# Patient Record
Sex: Female | Born: 1935
Health system: Southern US, Community
[De-identification: ages and names within clinical notes are randomized; demographics above are authoritative.]

## PROBLEM LIST (undated history)

## (undated) DIAGNOSIS — N186 End stage renal disease: Secondary | ICD-10-CM

## (undated) DIAGNOSIS — I10 Essential (primary) hypertension: Secondary | ICD-10-CM

## (undated) DIAGNOSIS — D649 Anemia, unspecified: Secondary | ICD-10-CM

## (undated) DIAGNOSIS — R600 Localized edema: Secondary | ICD-10-CM

## (undated) DIAGNOSIS — Z9289 Personal history of other medical treatment: Secondary | ICD-10-CM

## (undated) DIAGNOSIS — N059 Unspecified nephritic syndrome with unspecified morphologic changes: Secondary | ICD-10-CM

## (undated) DIAGNOSIS — R001 Bradycardia, unspecified: Secondary | ICD-10-CM

## (undated) DIAGNOSIS — I2510 Atherosclerotic heart disease of native coronary artery without angina pectoris: Secondary | ICD-10-CM

## (undated) DIAGNOSIS — E785 Hyperlipidemia, unspecified: Secondary | ICD-10-CM

## (undated) DIAGNOSIS — I6529 Occlusion and stenosis of unspecified carotid artery: Secondary | ICD-10-CM

## (undated) DIAGNOSIS — M810 Age-related osteoporosis without current pathological fracture: Secondary | ICD-10-CM

## (undated) DIAGNOSIS — E039 Hypothyroidism, unspecified: Secondary | ICD-10-CM

## (undated) DIAGNOSIS — N189 Chronic kidney disease, unspecified: Secondary | ICD-10-CM

## (undated) HISTORY — DX: Personal history of other medical treatment: Z92.89

## (undated) HISTORY — DX: Anemia, unspecified: D64.9

## (undated) HISTORY — DX: Age-related osteoporosis without current pathological fracture: M81.0

## (undated) HISTORY — DX: Chronic kidney disease, unspecified: N18.9

## (undated) HISTORY — DX: Hypothyroidism, unspecified: E03.9

## (undated) HISTORY — DX: Occlusion and stenosis of unspecified carotid artery: I65.29

## (undated) HISTORY — DX: Hyperlipidemia, unspecified: E78.5

## (undated) HISTORY — DX: Atherosclerotic heart disease of native coronary artery without angina pectoris: I25.10

## (undated) HISTORY — DX: Unspecified nephritic syndrome with unspecified morphologic changes: N05.9

## (undated) HISTORY — DX: Bradycardia, unspecified: R00.1

## (undated) HISTORY — DX: End stage renal disease: N18.6

## (undated) HISTORY — DX: Localized edema: R60.0

---
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
## 1999-05-08 HISTORY — PX: ANGIOPLASTY: SHX39

## 2003-01-06 DIAGNOSIS — Z9289 Personal history of other medical treatment: Secondary | ICD-10-CM

## 2003-01-06 HISTORY — DX: Personal history of other medical treatment: Z92.89

## 2003-05-08 HISTORY — PX: CARDIAC CATHETERIZATION: SHX172

## 2010-05-07 HISTORY — PX: EYE SURGERY: SHX253

## 2011-01-16 ENCOUNTER — Other Ambulatory Visit: Payer: Self-pay | Admitting: Family Medicine

## 2011-01-16 DIAGNOSIS — Z1231 Encounter for screening mammogram for malignant neoplasm of breast: Secondary | ICD-10-CM

## 2011-01-23 ENCOUNTER — Ambulatory Visit: Payer: Self-pay

## 2011-02-06 ENCOUNTER — Ambulatory Visit
Admission: RE | Admit: 2011-02-06 | Discharge: 2011-02-06 | Disposition: A | Discharge status: Home / Self Care | Payer: Medicare Other | Source: Ambulatory Visit | Attending: Family Medicine | Admitting: Family Medicine

## 2011-02-06 DIAGNOSIS — Z1231 Encounter for screening mammogram for malignant neoplasm of breast: Secondary | ICD-10-CM

## 2011-06-25 DIAGNOSIS — E039 Hypothyroidism, unspecified: Secondary | ICD-10-CM | POA: Diagnosis not present

## 2011-06-25 DIAGNOSIS — Z862 Personal history of diseases of the blood and blood-forming organs and certain disorders involving the immune mechanism: Secondary | ICD-10-CM | POA: Diagnosis not present

## 2011-06-25 DIAGNOSIS — M810 Age-related osteoporosis without current pathological fracture: Secondary | ICD-10-CM | POA: Diagnosis not present

## 2011-06-25 DIAGNOSIS — I2510 Atherosclerotic heart disease of native coronary artery without angina pectoris: Secondary | ICD-10-CM | POA: Diagnosis not present

## 2011-06-25 DIAGNOSIS — Z79899 Other long term (current) drug therapy: Secondary | ICD-10-CM | POA: Diagnosis not present

## 2011-06-25 DIAGNOSIS — E7800 Pure hypercholesterolemia, unspecified: Secondary | ICD-10-CM | POA: Diagnosis not present

## 2011-07-05 DIAGNOSIS — R030 Elevated blood-pressure reading, without diagnosis of hypertension: Secondary | ICD-10-CM | POA: Diagnosis not present

## 2011-07-05 DIAGNOSIS — E7800 Pure hypercholesterolemia, unspecified: Secondary | ICD-10-CM | POA: Diagnosis not present

## 2011-07-05 DIAGNOSIS — I2510 Atherosclerotic heart disease of native coronary artery without angina pectoris: Secondary | ICD-10-CM | POA: Diagnosis not present

## 2011-07-12 DIAGNOSIS — N039 Chronic nephritic syndrome with unspecified morphologic changes: Secondary | ICD-10-CM | POA: Diagnosis not present

## 2011-08-09 DIAGNOSIS — E7800 Pure hypercholesterolemia, unspecified: Secondary | ICD-10-CM | POA: Diagnosis not present

## 2011-08-09 DIAGNOSIS — E039 Hypothyroidism, unspecified: Secondary | ICD-10-CM | POA: Diagnosis not present

## 2011-08-09 DIAGNOSIS — Z79899 Other long term (current) drug therapy: Secondary | ICD-10-CM | POA: Diagnosis not present

## 2011-09-26 DIAGNOSIS — H04129 Dry eye syndrome of unspecified lacrimal gland: Secondary | ICD-10-CM | POA: Diagnosis not present

## 2011-10-11 DIAGNOSIS — E039 Hypothyroidism, unspecified: Secondary | ICD-10-CM | POA: Diagnosis not present

## 2011-10-11 DIAGNOSIS — E7800 Pure hypercholesterolemia, unspecified: Secondary | ICD-10-CM | POA: Diagnosis not present

## 2011-10-11 DIAGNOSIS — Z79899 Other long term (current) drug therapy: Secondary | ICD-10-CM | POA: Diagnosis not present

## 2011-12-11 DIAGNOSIS — D234 Other benign neoplasm of skin of scalp and neck: Secondary | ICD-10-CM | POA: Diagnosis not present

## 2011-12-11 DIAGNOSIS — Z85828 Personal history of other malignant neoplasm of skin: Secondary | ICD-10-CM | POA: Diagnosis not present

## 2011-12-11 DIAGNOSIS — D485 Neoplasm of uncertain behavior of skin: Secondary | ICD-10-CM | POA: Diagnosis not present

## 2011-12-26 DIAGNOSIS — E039 Hypothyroidism, unspecified: Secondary | ICD-10-CM | POA: Diagnosis not present

## 2012-01-09 DIAGNOSIS — I10 Essential (primary) hypertension: Secondary | ICD-10-CM | POA: Diagnosis not present

## 2012-01-09 DIAGNOSIS — E7800 Pure hypercholesterolemia, unspecified: Secondary | ICD-10-CM | POA: Diagnosis not present

## 2012-01-09 DIAGNOSIS — I2510 Atherosclerotic heart disease of native coronary artery without angina pectoris: Secondary | ICD-10-CM | POA: Diagnosis not present

## 2012-01-14 DIAGNOSIS — Z23 Encounter for immunization: Secondary | ICD-10-CM | POA: Diagnosis not present

## 2012-01-14 DIAGNOSIS — I2510 Atherosclerotic heart disease of native coronary artery without angina pectoris: Secondary | ICD-10-CM | POA: Diagnosis not present

## 2012-01-14 DIAGNOSIS — N039 Chronic nephritic syndrome with unspecified morphologic changes: Secondary | ICD-10-CM | POA: Diagnosis not present

## 2012-01-23 DIAGNOSIS — I2510 Atherosclerotic heart disease of native coronary artery without angina pectoris: Secondary | ICD-10-CM | POA: Diagnosis not present

## 2012-01-23 DIAGNOSIS — Z79899 Other long term (current) drug therapy: Secondary | ICD-10-CM | POA: Diagnosis not present

## 2012-01-23 DIAGNOSIS — E7800 Pure hypercholesterolemia, unspecified: Secondary | ICD-10-CM | POA: Diagnosis not present

## 2012-01-23 DIAGNOSIS — I10 Essential (primary) hypertension: Secondary | ICD-10-CM | POA: Diagnosis not present

## 2012-02-20 DIAGNOSIS — I2510 Atherosclerotic heart disease of native coronary artery without angina pectoris: Secondary | ICD-10-CM | POA: Diagnosis not present

## 2012-02-20 DIAGNOSIS — I10 Essential (primary) hypertension: Secondary | ICD-10-CM | POA: Diagnosis not present

## 2012-04-24 DIAGNOSIS — Z79899 Other long term (current) drug therapy: Secondary | ICD-10-CM | POA: Diagnosis not present

## 2012-04-24 DIAGNOSIS — E7800 Pure hypercholesterolemia, unspecified: Secondary | ICD-10-CM | POA: Diagnosis not present

## 2012-05-27 DIAGNOSIS — J069 Acute upper respiratory infection, unspecified: Secondary | ICD-10-CM | POA: Diagnosis not present

## 2012-07-09 DIAGNOSIS — I2510 Atherosclerotic heart disease of native coronary artery without angina pectoris: Secondary | ICD-10-CM | POA: Diagnosis not present

## 2012-07-14 DIAGNOSIS — I2510 Atherosclerotic heart disease of native coronary artery without angina pectoris: Secondary | ICD-10-CM | POA: Diagnosis not present

## 2012-07-14 DIAGNOSIS — R809 Proteinuria, unspecified: Secondary | ICD-10-CM | POA: Diagnosis not present

## 2012-07-14 DIAGNOSIS — N049 Nephrotic syndrome with unspecified morphologic changes: Secondary | ICD-10-CM | POA: Diagnosis not present

## 2012-10-23 DIAGNOSIS — E7800 Pure hypercholesterolemia, unspecified: Secondary | ICD-10-CM | POA: Diagnosis not present

## 2012-10-23 DIAGNOSIS — Z79899 Other long term (current) drug therapy: Secondary | ICD-10-CM | POA: Diagnosis not present

## 2012-12-10 DIAGNOSIS — L570 Actinic keratosis: Secondary | ICD-10-CM | POA: Diagnosis not present

## 2012-12-10 DIAGNOSIS — Z85828 Personal history of other malignant neoplasm of skin: Secondary | ICD-10-CM | POA: Diagnosis not present

## 2013-01-07 DIAGNOSIS — I10 Essential (primary) hypertension: Secondary | ICD-10-CM | POA: Diagnosis not present

## 2013-01-07 DIAGNOSIS — I2510 Atherosclerotic heart disease of native coronary artery without angina pectoris: Secondary | ICD-10-CM | POA: Diagnosis not present

## 2013-01-07 DIAGNOSIS — E7800 Pure hypercholesterolemia, unspecified: Secondary | ICD-10-CM | POA: Diagnosis not present

## 2013-01-09 DIAGNOSIS — E039 Hypothyroidism, unspecified: Secondary | ICD-10-CM | POA: Diagnosis not present

## 2013-01-09 DIAGNOSIS — Z23 Encounter for immunization: Secondary | ICD-10-CM | POA: Diagnosis not present

## 2013-01-09 DIAGNOSIS — I10 Essential (primary) hypertension: Secondary | ICD-10-CM | POA: Diagnosis not present

## 2013-01-09 DIAGNOSIS — E785 Hyperlipidemia, unspecified: Secondary | ICD-10-CM | POA: Diagnosis not present

## 2013-01-19 DIAGNOSIS — I2510 Atherosclerotic heart disease of native coronary artery without angina pectoris: Secondary | ICD-10-CM | POA: Diagnosis not present

## 2013-01-19 DIAGNOSIS — E785 Hyperlipidemia, unspecified: Secondary | ICD-10-CM | POA: Diagnosis not present

## 2013-01-19 DIAGNOSIS — R809 Proteinuria, unspecified: Secondary | ICD-10-CM | POA: Diagnosis not present

## 2013-01-19 DIAGNOSIS — N039 Chronic nephritic syndrome with unspecified morphologic changes: Secondary | ICD-10-CM | POA: Diagnosis not present

## 2013-01-19 DIAGNOSIS — N049 Nephrotic syndrome with unspecified morphologic changes: Secondary | ICD-10-CM | POA: Diagnosis not present

## 2013-01-24 ENCOUNTER — Other Ambulatory Visit: Payer: Self-pay | Admitting: Cardiology

## 2013-01-24 DIAGNOSIS — Z79899 Other long term (current) drug therapy: Secondary | ICD-10-CM

## 2013-01-24 DIAGNOSIS — E7800 Pure hypercholesterolemia, unspecified: Secondary | ICD-10-CM

## 2013-02-25 ENCOUNTER — Other Ambulatory Visit: Payer: Self-pay | Admitting: General Surgery

## 2013-02-25 MED ORDER — METOPROLOL SUCCINATE ER 50 MG PO TB24: 50.0000 mg | ORAL_TABLET | Freq: Every day | ORAL | Status: DC

## 2013-02-26 ENCOUNTER — Other Ambulatory Visit: Payer: Self-pay | Admitting: General Surgery

## 2013-02-26 ENCOUNTER — Telehealth: Payer: Self-pay | Admitting: Cardiology

## 2013-02-26 NOTE — Telephone Encounter (Signed)
New message    Refill----[facility]  Fax [phone].  Pt want 90day supply

## 2013-02-26 NOTE — Telephone Encounter (Signed)
Pt is aware med was sent in yesterday

## 2013-02-26 NOTE — Telephone Encounter (Signed)
Already sent in. No need for another

## 2013-04-27 ENCOUNTER — Other Ambulatory Visit (INDEPENDENT_AMBULATORY_CARE_PROVIDER_SITE_OTHER): Payer: Medicare Other

## 2013-04-27 DIAGNOSIS — E7800 Pure hypercholesterolemia, unspecified: Secondary | ICD-10-CM | POA: Diagnosis not present

## 2013-04-27 DIAGNOSIS — Z79899 Other long term (current) drug therapy: Secondary | ICD-10-CM

## 2013-04-27 LAB — LIPID PANEL
Cholesterol: 175 mg/dL (ref 0–200)
LDL Cholesterol: 89 mg/dL (ref 0–99)
Triglycerides: 91 mg/dL (ref 0.0–149.0)
VLDL: 18.2 mg/dL (ref 0.0–40.0)

## 2013-04-28 ENCOUNTER — Encounter: Payer: Self-pay | Admitting: General Surgery

## 2013-04-28 ENCOUNTER — Other Ambulatory Visit: Payer: Self-pay | Admitting: General Surgery

## 2013-04-28 DIAGNOSIS — E785 Hyperlipidemia, unspecified: Secondary | ICD-10-CM

## 2013-07-23 DIAGNOSIS — N049 Nephrotic syndrome with unspecified morphologic changes: Secondary | ICD-10-CM | POA: Diagnosis not present

## 2013-07-23 DIAGNOSIS — R809 Proteinuria, unspecified: Secondary | ICD-10-CM | POA: Diagnosis not present

## 2013-07-23 DIAGNOSIS — N039 Chronic nephritic syndrome with unspecified morphologic changes: Secondary | ICD-10-CM | POA: Diagnosis not present

## 2013-07-23 DIAGNOSIS — E785 Hyperlipidemia, unspecified: Secondary | ICD-10-CM | POA: Diagnosis not present

## 2013-07-23 DIAGNOSIS — I2510 Atherosclerotic heart disease of native coronary artery without angina pectoris: Secondary | ICD-10-CM | POA: Diagnosis not present

## 2013-07-27 ENCOUNTER — Ambulatory Visit: Payer: Medicare Other | Admitting: Cardiology

## 2013-07-30 ENCOUNTER — Encounter: Payer: Self-pay | Admitting: General Surgery

## 2013-07-30 DIAGNOSIS — E7800 Pure hypercholesterolemia, unspecified: Secondary | ICD-10-CM | POA: Insufficient documentation

## 2013-07-30 DIAGNOSIS — I2510 Atherosclerotic heart disease of native coronary artery without angina pectoris: Secondary | ICD-10-CM

## 2013-07-30 DIAGNOSIS — I10 Essential (primary) hypertension: Secondary | ICD-10-CM

## 2013-08-10 ENCOUNTER — Encounter: Payer: Self-pay | Admitting: Cardiology

## 2013-08-10 ENCOUNTER — Ambulatory Visit (INDEPENDENT_AMBULATORY_CARE_PROVIDER_SITE_OTHER): Payer: Medicare Other | Admitting: Cardiology

## 2013-08-10 VITALS — BP 177/62 | HR 61 | Ht 64.0 in | Wt 159.0 lb

## 2013-08-10 DIAGNOSIS — I10 Essential (primary) hypertension: Secondary | ICD-10-CM

## 2013-08-10 DIAGNOSIS — I2510 Atherosclerotic heart disease of native coronary artery without angina pectoris: Secondary | ICD-10-CM

## 2013-08-10 DIAGNOSIS — E7800 Pure hypercholesterolemia, unspecified: Secondary | ICD-10-CM

## 2013-08-10 NOTE — Patient Instructions (Signed)
Your physician recommends that you continue on your current medications as directed. Please refer to the Current Medication list given to you today.  Monitor your blood pressure once this week and call with reading. [phone]  Your physician wants you to follow-up in: 6 months You will receive a reminder letter in the mail two months in advance. If you don't receive a letter, please call our office to schedule the follow-up appointment.

## 2013-08-10 NOTE — Progress Notes (Signed)
[facility], [place], [place]  [address] Phone: [phone] Fax:  [phone]  Date:  0000000   ID:  Cree Ramchandani, DOB [id number], MRN [id number]  PCP:  Gavin Pound, MD  Cardiologist:  Fransico Him, MD     History of Present Illness: Wendy Walters is a 80 y.o. female with a history of  ASCAD s/p PCI of LAD 2001 in setting of AWMI and cath 2005 with patent stent to the LAD by cath 2005, dyslipidemia and HTN.  She is doing well.  She denies any chest pain, SOB, DOE,  dizziness, palpitations or syncope.  She has recently had some LE edema and her nephrologist just put her on Lasix a few days ago.  She walks for exercise.   Wt Readings from Last 3 Encounters:  08/10/13 159 lb (72.122 kg)     Past Medical History  Diagnosis Date  . Chronic kidney disease     stage 3  . Fibrillary glomerulonephritis     w nephrotic range protenuria, following w neurology, managed with an ACE-I and ARB  . Heart attack 02/03/2000  . Hypothyroidism   . Hyperlipidemia   . Osteoporosis     s/p actonel for about 7-8 years  . Coronary artery disease     s/p PCI 2001 of lad in setting of AWMI-rotational atherectomy with BMS -Dr Radford Pax  . H/O Doppler ultrasound 01/2003    ,39% bilateral carotid stenosis.    Current Outpatient Prescriptions  Medication Sig Dispense Refill  . amLODipine (NORVASC) 2.5 MG tablet Take 2.5 mg by mouth daily.      . Ascorbic Acid (VITAMIN C) 1000 MG tablet Take 1,000 mg by mouth daily.      Marland Kitchen aspirin 81 MG tablet Take 81 mg by mouth daily.      Marland Kitchen atorvastatin (LIPITOR) 80 MG tablet Take 80 mg by mouth daily.      . B Complex Vitamins (VITAMIN B COMPLEX PO) Take by mouth daily.      . calcium carbonate (CALCIUM 600) 600 MG TABS tablet Take 600 mg by mouth daily with breakfast.      . cholecalciferol (VITAMIN D) 1000 UNITS tablet Take by mouth daily.      . Coenzyme Q10 (COQ10) 200 MG CAPS Take by mouth daily.      . ferrous fumarate (HEMOCYTE - 106 MG FE)  325 (106 FE) MG TABS tablet Take 1 tablet by mouth daily.      . furosemide (LASIX) 20 MG tablet Take 20 mg by mouth daily.      Marland Kitchen levothyroxine (SYNTHROID, LEVOTHROID) 88 MCG tablet Take 88 mcg by mouth daily before breakfast.      . lisinopril (PRINIVIL,ZESTRIL) 40 MG tablet Take 40 mg by mouth 2 (two) times daily.       Marland Kitchen losartan (COZAAR) 50 MG tablet Take 50 mg by mouth daily.      . metoprolol succinate (TOPROL-XL) 50 MG 24 hr tablet Take 1 tablet (50 mg total) by mouth daily. Take with or immediately following a meal.  90 tablet  2  . Misc Natural Products (OSTEO BI-FLEX JOINT SHIELD PO) Take 2 tablets by mouth daily.      . Multiple Vitamin (MULTIVITAMIN) tablet Take 1 tablet by mouth daily.      . niacin 500 MG tablet Take 500 mg by mouth 2 (two) times daily with a meal.      . Omega-3 Fatty Acids (FISH OIL) 1000 MG CAPS  Take 5 capsules by mouth daily.       No current facility-administered medications for this visit.    Allergies:    Allergies  Allergen Reactions  . Codeine Nausea Only    Social History:  The patient  reports that she has quit smoking. She does not have any smokeless tobacco history on file. She reports that she does not drink alcohol or use illicit drugs.   Family History:  The patient's family history includes Cancer - Prostate in her father; Hypertension in her mother.   ROS:  Please see the history of present illness.      All other systems reviewed and negative.   PHYSICAL EXAM: VS:  BP 177/62  Pulse 61  Ht 5\' 4"  (1.626 m)  Wt 159 lb (72.122 kg)  BMI 27.28 kg/m2 Well nourished, well developed, in no acute distress HEENT: normal Neck: no JVD Cardiac:  normal S1, S2; RRR; no murmur Lungs:  clear to auscultation bilaterally, no wheezing, rhonchi or rales Abd: soft, nontender, no hepatomegaly Ext: trace edema Skin: warm and dry Neuro:  CNs 2-12 intact, no focal abnormalities noted       ASSESSMENT AND PLAN:  1. ASCAD with no angina - continue  ASA 2. HTN elevated today but she says it is normal at home - Check BP daily for a week and call with the results - continue Lisinopril/Losartan/Metoprolol/amlodipine 3. Dyslipidemia with LDL at goal 10/2012 -continue atorvastatin/fish oil  Followup with me in 6 months   Signed, Fransico Him, MD 08/10/2013 8:50 AM

## 2013-08-17 ENCOUNTER — Telehealth: Payer: Self-pay | Admitting: Cardiology

## 2013-08-17 NOTE — Telephone Encounter (Signed)
TO Dr Radford Pax to advise.

## 2013-08-17 NOTE — Telephone Encounter (Signed)
Pt is aware.  

## 2013-08-17 NOTE — Telephone Encounter (Signed)
BP well controlled no change in meds at this time

## 2013-08-17 NOTE — Telephone Encounter (Signed)
New message    Patient calling with blood pressures reading    4/6 131/82 4/7 138/83 4/8 115/80 4/9 109/69 4/10 111/74  4/11 116/71 4/12 141/84 4/13 130/85.

## 2013-09-07 DIAGNOSIS — N039 Chronic nephritic syndrome with unspecified morphologic changes: Secondary | ICD-10-CM | POA: Diagnosis not present

## 2013-09-07 DIAGNOSIS — R809 Proteinuria, unspecified: Secondary | ICD-10-CM | POA: Diagnosis not present

## 2013-09-07 DIAGNOSIS — E785 Hyperlipidemia, unspecified: Secondary | ICD-10-CM | POA: Diagnosis not present

## 2013-09-07 DIAGNOSIS — N049 Nephrotic syndrome with unspecified morphologic changes: Secondary | ICD-10-CM | POA: Diagnosis not present

## 2013-09-07 DIAGNOSIS — I2510 Atherosclerotic heart disease of native coronary artery without angina pectoris: Secondary | ICD-10-CM | POA: Diagnosis not present

## 2013-10-15 ENCOUNTER — Other Ambulatory Visit: Payer: Self-pay

## 2013-10-15 MED ORDER — ATORVASTATIN CALCIUM 80 MG PO TABS: 80.0000 mg | ORAL_TABLET | Freq: Every day | ORAL | Status: DC

## 2013-10-15 MED ORDER — AMLODIPINE BESYLATE 2.5 MG PO TABS: 2.5000 mg | ORAL_TABLET | Freq: Every day | ORAL | Status: DC

## 2013-10-27 ENCOUNTER — Encounter: Payer: Self-pay | Admitting: General Surgery

## 2013-10-27 ENCOUNTER — Other Ambulatory Visit: Payer: Self-pay | Admitting: General Surgery

## 2013-10-27 ENCOUNTER — Other Ambulatory Visit (INDEPENDENT_AMBULATORY_CARE_PROVIDER_SITE_OTHER): Payer: Medicare Other

## 2013-10-27 DIAGNOSIS — E785 Hyperlipidemia, unspecified: Secondary | ICD-10-CM | POA: Diagnosis not present

## 2013-10-27 DIAGNOSIS — E7800 Pure hypercholesterolemia, unspecified: Secondary | ICD-10-CM

## 2013-10-27 LAB — LIPID PANEL
Cholesterol: 165 mg/dL (ref 0–200)
HDL: 69.6 mg/dL (ref >39.00)
LDL Cholesterol: 74 mg/dL (ref 0–99)
NONHDL: 95.4
TRIGLYCERIDES: 107 mg/dL (ref 0.0–149.0)
Total CHOL/HDL Ratio: 2
VLDL: 21.4 mg/dL (ref 0.0–40.0)

## 2013-10-27 LAB — ALT: ALT: 27 U/L (ref 0–35)

## 2013-12-01 ENCOUNTER — Other Ambulatory Visit: Payer: Self-pay | Admitting: *Deleted

## 2013-12-01 MED ORDER — METOPROLOL SUCCINATE ER 50 MG PO TB24
50.0000 mg | ORAL_TABLET | Freq: Every day | ORAL | Status: DC
Start: 2013-12-01 — End: 2014-03-09

## 2014-01-12 ENCOUNTER — Encounter: Payer: Self-pay | Admitting: Cardiology

## 2014-01-12 DIAGNOSIS — E039 Hypothyroidism, unspecified: Secondary | ICD-10-CM | POA: Diagnosis not present

## 2014-01-12 DIAGNOSIS — Z0000 Encounter for general adult medical examination without abnormal findings: Secondary | ICD-10-CM | POA: Diagnosis not present

## 2014-01-12 DIAGNOSIS — E785 Hyperlipidemia, unspecified: Secondary | ICD-10-CM | POA: Diagnosis not present

## 2014-01-12 DIAGNOSIS — Z23 Encounter for immunization: Secondary | ICD-10-CM | POA: Diagnosis not present

## 2014-01-12 DIAGNOSIS — I10 Essential (primary) hypertension: Secondary | ICD-10-CM | POA: Diagnosis not present

## 2014-01-14 DIAGNOSIS — Z85828 Personal history of other malignant neoplasm of skin: Secondary | ICD-10-CM | POA: Diagnosis not present

## 2014-01-14 DIAGNOSIS — L570 Actinic keratosis: Secondary | ICD-10-CM | POA: Diagnosis not present

## 2014-01-15 ENCOUNTER — Encounter: Payer: Self-pay | Admitting: General Surgery

## 2014-01-18 ENCOUNTER — Encounter: Payer: Self-pay | Admitting: General Surgery

## 2014-01-26 DIAGNOSIS — M810 Age-related osteoporosis without current pathological fracture: Secondary | ICD-10-CM | POA: Diagnosis not present

## 2014-02-03 DIAGNOSIS — Z1211 Encounter for screening for malignant neoplasm of colon: Secondary | ICD-10-CM | POA: Diagnosis not present

## 2014-02-08 NOTE — Progress Notes (Signed)
[facility], [address] [place], [place]  [address] Phone: [phone] Fax:  [phone]  Date:  [id number]   ID:  Cabria Elefante, DOB [id number], MRN [id number]  PCP:  Gavin Pound, MD  Cardiologist:  Fransico Him, MD    History of Present Illness: Wendy Walters is a 80 y.o. female with a history of ASCAD s/p PCI of LAD 2001 in setting of AWMI and cath 2005 with patent stent to the LAD by cath 2005, dyslipidemia and HTN. She is doing well. She denies any chest pain, SOB, DOE, dizziness, palpitations or syncope.   She walks for exercise.  She has chronic LE edema that is stable and controlled on diuretics.    Wt Readings from Last 3 Encounters:  02/09/14 157 lb (71.215 kg)  08/10/13 159 lb (72.122 kg)     Past Medical History  Diagnosis Date  . Chronic kidney disease     stage 3  . Fibrillary glomerulonephritis     w nephrotic range protenuria, following w neurology, managed with an ACE-I and ARB  . Heart attack 02/03/2000  . Hypothyroidism   . Hyperlipidemia   . Osteoporosis     s/p actonel for about 7-8 years  . Coronary artery disease     s/p PCI 2001 of lad in setting of AWMI-rotational atherectomy with BMS -Dr Radford Pax  . H/O Doppler ultrasound 01/2003    ,39% bilateral carotid stenosis.    Current Outpatient Prescriptions  Medication Sig Dispense Refill  . amLODipine (NORVASC) 2.5 MG tablet Take 1 tablet (2.5 mg total) by mouth daily.  90 tablet  2  . Ascorbic Acid (VITAMIN C) 1000 MG tablet Take 1,000 mg by mouth daily.      Marland Kitchen aspirin 81 MG tablet Take 81 mg by mouth daily.      Marland Kitchen atorvastatin (LIPITOR) 80 MG tablet Take 1 tablet (80 mg total) by mouth daily.  90 tablet  2  . B Complex Vitamins (VITAMIN B COMPLEX PO) Take by mouth daily.      . calcium carbonate (CALCIUM 600) 600 MG TABS tablet Take 600 mg by mouth daily with breakfast.      . cholecalciferol (VITAMIN D) 1000 UNITS tablet Take by mouth daily.      . Coenzyme Q10 (COQ10) 200 MG CAPS Take by  mouth daily.      . ferrous fumarate (HEMOCYTE - 106 MG FE) 325 (106 FE) MG TABS tablet Take 1 tablet by mouth daily.      . furosemide (LASIX) 20 MG tablet Take 20 mg by mouth daily.      Marland Kitchen levothyroxine (SYNTHROID, LEVOTHROID) 88 MCG tablet Take 88 mcg by mouth daily before breakfast.      . lisinopril (PRINIVIL,ZESTRIL) 40 MG tablet Take 40 mg by mouth 2 (two) times daily.       Marland Kitchen losartan (COZAAR) 50 MG tablet Take 50 mg by mouth daily.      . metoprolol succinate (TOPROL-XL) 50 MG 24 hr tablet Take 1 tablet (50 mg total) by mouth daily. Take with or immediately following a meal.  90 tablet  0  . Misc Natural Products (OSTEO BI-FLEX JOINT SHIELD PO) Take 2 tablets by mouth daily.      . Multiple Vitamin (MULTIVITAMIN) tablet Take 1 tablet by mouth daily.      . niacin 500 MG tablet Take 500 mg by mouth 2 (two) times daily with a meal.      . Omega-3 Fatty Acids (FISH  OIL) 1000 MG CAPS Take 5 capsules by mouth daily.       No current facility-administered medications for this visit.    Allergies:    Allergies  Allergen Reactions  . Codeine Nausea Only    Social History:  The patient  reports that she has quit smoking. She does not have any smokeless tobacco history on file. She reports that she does not drink alcohol or use illicit drugs.   Family History:  The patient's family history includes Cancer - Prostate in her father; Hypertension in her mother.   ROS:  Please see the history of present illness.      All other systems reviewed and negative.   PHYSICAL EXAM: VS:  BP 134/70  Pulse 63  Ht 5\' 4"  (1.626 m)  Wt 157 lb (71.215 kg)  BMI 26.94 kg/m2 Well nourished, well developed, in no acute distress HEENT: normal Neck: no JVD Cardiac:  normal S1, S2; RRR; no murmur Lungs:  clear to auscultation bilaterally, no wheezing, rhonchi or rales Abd: soft, nontender, no hepatomegaly Ext: trace edema Skin: warm and dry Neuro:  CNs 2-12 intact, no focal abnormalities noted    ASSESSMENT AND PLAN:  1. ASCAD with no angina - continue ASA  2. HTN - well controlled - continue Lisinopril/Losartan/Metoprolol/amlodipine  3. Dyslipidemia with LDL near goal 10/2013 -continue atorvastatin/fish oil   Followup with me in 6 months      Signed, Fransico Him, MD [facility] 02/09/2014 10:33 AM

## 2014-02-09 ENCOUNTER — Ambulatory Visit (INDEPENDENT_AMBULATORY_CARE_PROVIDER_SITE_OTHER): Payer: Medicare Other | Admitting: Cardiology

## 2014-02-09 ENCOUNTER — Encounter: Payer: Self-pay | Admitting: Cardiology

## 2014-02-09 VITALS — BP 134/70 | HR 63 | Ht 64.0 in | Wt 157.0 lb

## 2014-02-09 DIAGNOSIS — E7800 Pure hypercholesterolemia, unspecified: Secondary | ICD-10-CM

## 2014-02-09 DIAGNOSIS — I10 Essential (primary) hypertension: Secondary | ICD-10-CM

## 2014-02-09 DIAGNOSIS — I2510 Atherosclerotic heart disease of native coronary artery without angina pectoris: Secondary | ICD-10-CM

## 2014-02-09 NOTE — Patient Instructions (Signed)
Your physician recommends that you continue on your current medications as directed. Please refer to the Current Medication list given to you today.  Your physician wants you to follow-up in: 6 months. You will receive a reminder letter in the mail two months in advance. If you don't receive a letter, please call our office to schedule the follow-up appointment.  

## 2014-03-09 ENCOUNTER — Other Ambulatory Visit: Payer: Self-pay

## 2014-03-09 MED ORDER — METOPROLOL SUCCINATE ER 50 MG PO TB24: 50.0000 mg | ORAL_TABLET | Freq: Every day | ORAL | Status: DC

## 2014-03-31 DIAGNOSIS — R809 Proteinuria, unspecified: Secondary | ICD-10-CM | POA: Diagnosis not present

## 2014-03-31 DIAGNOSIS — N039 Chronic nephritic syndrome with unspecified morphologic changes: Secondary | ICD-10-CM | POA: Diagnosis not present

## 2014-03-31 DIAGNOSIS — E785 Hyperlipidemia, unspecified: Secondary | ICD-10-CM | POA: Diagnosis not present

## 2014-03-31 DIAGNOSIS — N049 Nephrotic syndrome with unspecified morphologic changes: Secondary | ICD-10-CM | POA: Diagnosis not present

## 2014-03-31 DIAGNOSIS — I2510 Atherosclerotic heart disease of native coronary artery without angina pectoris: Secondary | ICD-10-CM | POA: Diagnosis not present

## 2014-04-28 ENCOUNTER — Other Ambulatory Visit: Payer: Medicare Other

## 2014-05-13 ENCOUNTER — Other Ambulatory Visit: Payer: Self-pay | Admitting: Cardiology

## 2014-05-20 ENCOUNTER — Other Ambulatory Visit: Payer: Medicare Other

## 2014-05-21 ENCOUNTER — Other Ambulatory Visit (INDEPENDENT_AMBULATORY_CARE_PROVIDER_SITE_OTHER): Payer: Medicare Other | Admitting: *Deleted

## 2014-05-21 DIAGNOSIS — E7800 Pure hypercholesterolemia, unspecified: Secondary | ICD-10-CM

## 2014-05-21 LAB — HEPATIC FUNCTION PANEL
ALT: 25 U/L (ref 0–35)
AST: 34 U/L (ref 0–37)
Albumin: 3.2 g/dL — ABNORMAL LOW (ref 3.5–5.2)
Alkaline Phosphatase: 126 U/L — ABNORMAL HIGH (ref 39–117)
Bilirubin, Direct: 0.1 mg/dL (ref 0.0–0.3)
Total Bilirubin: 0.4 mg/dL (ref 0.2–1.2)
Total Protein: 6.2 g/dL (ref 6.0–8.3)

## 2014-05-21 LAB — LIPID PANEL
CHOL/HDL RATIO: 3
Cholesterol: 165 mg/dL (ref 0–200)
HDL: 53.7 mg/dL (ref >39.00)
LDL CALC: 84 mg/dL (ref 0–99)
NonHDL: 111.3
Triglycerides: 138 mg/dL (ref 0.0–149.0)
VLDL: 27.6 mg/dL (ref 0.0–40.0)

## 2014-05-26 ENCOUNTER — Encounter: Payer: Self-pay | Admitting: Cardiology

## 2014-06-03 ENCOUNTER — Telehealth: Payer: Self-pay

## 2014-06-03 DIAGNOSIS — E7800 Pure hypercholesterolemia, unspecified: Secondary | ICD-10-CM

## 2014-06-03 NOTE — Telephone Encounter (Signed)
Patient informed of results and verbal understanding expressed.  Informed patient that there will be no medication changes at this time. Encouraged patient to follow a law-fat diet and to exercise. Repeat lab work scheduled for July.  Patient agrees with treatment plan.

## 2014-06-03 NOTE — Telephone Encounter (Signed)
-----   Message from Aris Georgia, [facility] sent at 06/01/2014 12:33 PM EST ----- RF: CAD, HTN, age - LDL goal < 70, non-HDL goal < 100. Meds: Lipitor 80 mg qd, fish oil 4 g/d, niacin 500 mg bid. LDL has been inconsistent on this therapy ~ 70 mg/dL, then went up to 89 mg/dL, back to 74 mg/dL, and now 84 mg/dL She was on Crestor in past, but had to change to lipitor for insurance reasons. Plan: 1. No change in meds at this time. 2. Low fat diet encouraged. 3. Recheck lipid panel and hepatic panel in 6 months. Please notify patient, and set up labs. Thanks.

## 2014-07-15 DIAGNOSIS — M810 Age-related osteoporosis without current pathological fracture: Secondary | ICD-10-CM | POA: Diagnosis not present

## 2014-08-10 NOTE — Progress Notes (Signed)
Cardiology Office Note   Date:  [id number]   ID:  Jevaeh Mukai, DOB [id number], MRN [id number]  PCP:  Gavin Pound, MD    Chief Complaint  Patient presents with  . Coronary Artery Disease  . Hypertension  . Hyperlipidemia      History of Present Illness: Wendy Walters is a 81 y.o. female with a history of ASCAD s/p PCI of LAD 2001 in setting of AWMI and cath 2005 with patent stent to the LAD by cath 2005, dyslipidemia and HTN. She is doing well. She denies any chest pain, SOB, DOE, dizziness, palpitations or syncope. She walks for exercise. She has chronic LE edema that she thinks has gotten worse.  She does not use table salt.      Past Medical History  Diagnosis Date  . Chronic kidney disease     stage 3  . Fibrillary glomerulonephritis     w nephrotic range protenuria, following w neurology, managed with an ACE-I and ARB  . Heart attack 02/03/2000  . Hypothyroidism   . Hyperlipidemia   . Osteoporosis     s/p actonel for about 7-8 years  . Coronary artery disease     s/p PCI 2001 of lad in setting of AWMI-rotational atherectomy with BMS -Dr Radford Pax  . H/O Doppler ultrasound 01/2003    ,39% bilateral carotid stenosis.    Past Surgical History  Procedure Laterality Date  . Cardiac catheterization  2005    patent LAD stent     Current Outpatient Prescriptions  Medication Sig Dispense Refill  . amLODipine (NORVASC) 2.5 MG tablet Take 1 tablet (2.5 mg total) by mouth daily. 90 tablet 2  . Ascorbic Acid (VITAMIN C) 1000 MG tablet Take 1,000 mg by mouth daily.    Marland Kitchen aspirin 81 MG tablet Take 81 mg by mouth daily.    Marland Kitchen atorvastatin (LIPITOR) 80 MG tablet TAKE 1 TABLET EVERY DAY 90 tablet 2  . B Complex Vitamins (VITAMIN B COMPLEX PO) Take by mouth daily.    . calcium carbonate (CALCIUM 600) 600 MG TABS tablet Take 600 mg by mouth daily with breakfast.    . cholecalciferol (VITAMIN D) 1000 UNITS tablet Take by mouth daily.    . Coenzyme Q10 (COQ10) 200 MG CAPS  Take by mouth daily.    . ferrous fumarate (HEMOCYTE - 106 MG FE) 325 (106 FE) MG TABS tablet Take 1 tablet by mouth daily.    Marland Kitchen levothyroxine (SYNTHROID, LEVOTHROID) 88 MCG tablet Take 88 mcg by mouth daily before breakfast.    . lisinopril (PRINIVIL,ZESTRIL) 40 MG tablet Take 40 mg by mouth 2 (two) times daily.     Marland Kitchen losartan (COZAAR) 50 MG tablet Take 50 mg by mouth daily.    . metoprolol succinate (TOPROL-XL) 50 MG 24 hr tablet Take 1 tablet (50 mg total) by mouth daily. Take with or immediately following a meal. 90 tablet 1  . Misc Natural Products (OSTEO BI-FLEX JOINT SHIELD PO) Take 2 tablets by mouth daily.    . Multiple Vitamin (MULTIVITAMIN) tablet Take 1 tablet by mouth daily.    . niacin 500 MG tablet Take 500 mg by mouth 2 (two) times daily with a meal.    . Omega-3 Fatty Acids (FISH OIL) 1000 MG CAPS Take 2 capsules by mouth daily.     . furosemide (LASIX) 40 MG tablet Take 1 tablet (40 mg total) by mouth daily. 90 tablet 3   No current facility-administered medications for this visit.  Allergies:   Codeine    Social History:  The patient  reports that she has quit smoking. She does not have any smokeless tobacco history on file. She reports that she does not drink alcohol or use illicit drugs.   Family History:  The patient's family history includes Cancer - Prostate in her father; Hypertension in her mother.    ROS:  Please see the history of present illness.   Otherwise, review of systems are positive for none.   All other systems are reviewed and negative.    PHYSICAL EXAM: VS:  BP 130/80 mmHg  Pulse 58  Ht 5\' 4"  (1.626 m)  Wt 156 lb 12.8 oz (71.124 kg)  BMI 26.90 kg/m2 , BMI Body mass index is 26.9 kg/(m^2). GEN: Well nourished, well developed, in no acute distress HEENT: normal Neck: no JVD, carotid bruits, or masses Cardiac: RRR; no murmurs, rubs, or gallops.  1+LE edema  Respiratory:  clear to auscultation bilaterally, normal work of breathing GI: soft,  nontender, nondistended, + BS MS: no deformity or atrophy Skin: warm and dry, no rash Neuro:  Strength and sensation are intact Psych: euthymic mood, full affect   EKG:  EKG is not ordered today.    Recent Labs: 05/21/2014: ALT 25    Lipid Panel    Component Value Date/Time   CHOL 165 05/21/2014 0945   TRIG 138.0 05/21/2014 0945   HDL 53.70 05/21/2014 0945   CHOLHDL 3 05/21/2014 0945   VLDL 27.6 05/21/2014 0945   LDLCALC 84 05/21/2014 0945      Wt Readings from Last 3 Encounters:  08/11/14 156 lb 12.8 oz (71.124 kg)  02/09/14 157 lb (71.215 kg)  08/10/13 159 lb (72.122 kg)    ASSESSMENT AND PLAN:  1. ASCAD with no angina - continue ASA  2. HTN - well controlled - continue Lisinopril/Losartan/Metoprolol/amlodipine  3. Dyslipidemia with LDL86 05/2014 - recheck in 10/2014 -continue atorvastatin/fish oil        4.  LE edema - this has worsened. I have asked her to check with her renal MD to make sure she is ok to increase lasix to 40mg  daily.  She will call and let me know.      Current medicines are reviewed at length with the patient today.  The patient does not have concerns regarding medicines.  The following changes have been made:  Increase lasix to 40mg  daily  Labs/ tests ordered today include: see above assessment and plan  Orders Placed This Encounter  Procedures  . Hepatic function panel  . Lipid Profile  . EKG 12-Lead     Disposition:   FU with me in 6 months   Signed, Sueanne Margarita, MD  08/11/2014 2:11 PM    [facility] [place], [place], [place] [employer]  [address] Phone: [phone]; Fax: [phone]

## 2014-08-11 ENCOUNTER — Ambulatory Visit (INDEPENDENT_AMBULATORY_CARE_PROVIDER_SITE_OTHER): Payer: Medicare Other | Admitting: Cardiology

## 2014-08-11 ENCOUNTER — Encounter: Payer: Self-pay | Admitting: Cardiology

## 2014-08-11 VITALS — BP 130/80 | HR 58 | Ht 64.0 in | Wt 156.8 lb

## 2014-08-11 DIAGNOSIS — I10 Essential (primary) hypertension: Secondary | ICD-10-CM

## 2014-08-11 DIAGNOSIS — I2510 Atherosclerotic heart disease of native coronary artery without angina pectoris: Secondary | ICD-10-CM | POA: Diagnosis not present

## 2014-08-11 DIAGNOSIS — E7800 Pure hypercholesterolemia, unspecified: Secondary | ICD-10-CM

## 2014-08-11 MED ORDER — FUROSEMIDE 40 MG PO TABS: 40.0000 mg | ORAL_TABLET | Freq: Every day | ORAL | Status: DC

## 2014-08-11 NOTE — Patient Instructions (Addendum)
Medication Instructions:  1) INCREASE Lasix to 40mg  daily  Labwork: Your physician recommends that you return for lab work in: June 2016 (LFT, Lipids)   Testing/Procedures: None  Follow-Up: Your physician wants you to follow-up in: 6 months with Dr. Radford Pax. You will receive a reminder letter in the mail two months in advance. If you don't receive a letter, please call our office to schedule the follow-up appointment.   Any Other Special Instructions Will Be Listed Below (If Applicable)

## 2014-08-12 ENCOUNTER — Telehealth: Payer: Self-pay | Admitting: Cardiology

## 2014-08-12 DIAGNOSIS — I10 Essential (primary) hypertension: Secondary | ICD-10-CM

## 2014-08-12 NOTE — Telephone Encounter (Signed)
New Message  Pt called states that [facility] in [place] approved the lasix increase. Please call back to discuss

## 2014-08-12 NOTE — Telephone Encounter (Signed)
Called patient back. She wanted Dr.Turner to know that she asked Dr. Carrolyn Meiers about the increased dose of lasix to 40mg  per day and he was OK with this. Advised will let Dr.Turner know.

## 2014-08-14 NOTE — Telephone Encounter (Signed)
Check BMET in 1 week

## 2014-08-16 NOTE — Telephone Encounter (Signed)
BMET scheduled for 4/18. Patient agrees with treatment plan.

## 2014-08-23 ENCOUNTER — Other Ambulatory Visit (INDEPENDENT_AMBULATORY_CARE_PROVIDER_SITE_OTHER): Payer: Medicare Other | Admitting: *Deleted

## 2014-08-23 DIAGNOSIS — I10 Essential (primary) hypertension: Secondary | ICD-10-CM

## 2014-08-23 LAB — BASIC METABOLIC PANEL
BUN: 23 mg/dL (ref 6–23)
CALCIUM: 9.1 mg/dL (ref 8.4–10.5)
CO2: 25 mEq/L (ref 19–32)
CREATININE: 1.29 mg/dL — AB (ref 0.40–1.20)
Chloride: 104 mEq/L (ref 96–112)
GFR: 42.32 mL/min — ABNORMAL LOW (ref >60.00)
Glucose, Bld: 102 mg/dL — ABNORMAL HIGH (ref 70–99)
Potassium: 3.5 mEq/L (ref 3.5–5.1)
Sodium: 137 mEq/L (ref 135–145)

## 2014-09-03 ENCOUNTER — Other Ambulatory Visit: Payer: Self-pay | Admitting: Cardiology

## 2014-09-29 DIAGNOSIS — N059 Unspecified nephritic syndrome with unspecified morphologic changes: Secondary | ICD-10-CM | POA: Diagnosis not present

## 2014-10-11 ENCOUNTER — Other Ambulatory Visit (INDEPENDENT_AMBULATORY_CARE_PROVIDER_SITE_OTHER): Payer: Medicare Other | Admitting: *Deleted

## 2014-10-11 DIAGNOSIS — E7800 Pure hypercholesterolemia, unspecified: Secondary | ICD-10-CM

## 2014-10-11 DIAGNOSIS — I10 Essential (primary) hypertension: Secondary | ICD-10-CM

## 2014-10-11 LAB — HEPATIC FUNCTION PANEL
ALBUMIN: 3.5 g/dL (ref 3.5–5.2)
ALK PHOS: 75 U/L (ref 39–117)
ALT: 18 U/L (ref 0–35)
AST: 25 U/L (ref 0–37)
BILIRUBIN DIRECT: 0 mg/dL (ref 0.0–0.3)
TOTAL PROTEIN: 6.2 g/dL (ref 6.0–8.3)
Total Bilirubin: 0.3 mg/dL (ref 0.2–1.2)

## 2014-10-11 LAB — LIPID PANEL
CHOL/HDL RATIO: 3
CHOLESTEROL: 163 mg/dL (ref 0–200)
HDL: 63.1 mg/dL (ref >39.00)
LDL CALC: 74 mg/dL (ref 0–99)
NONHDL: 99.9
Triglycerides: 128 mg/dL (ref 0.0–149.0)
VLDL: 25.6 mg/dL (ref 0.0–40.0)

## 2014-10-11 NOTE — Addendum Note (Signed)
Addended by: Eulis Foster on: 10/11/2014 10:04 AM   Modules accepted: Orders

## 2014-10-12 DIAGNOSIS — L570 Actinic keratosis: Secondary | ICD-10-CM | POA: Diagnosis not present

## 2014-10-28 DIAGNOSIS — L570 Actinic keratosis: Secondary | ICD-10-CM | POA: Diagnosis not present

## 2014-12-02 ENCOUNTER — Other Ambulatory Visit (INDEPENDENT_AMBULATORY_CARE_PROVIDER_SITE_OTHER): Payer: Medicare Other | Admitting: *Deleted

## 2014-12-02 DIAGNOSIS — E7800 Pure hypercholesterolemia, unspecified: Secondary | ICD-10-CM

## 2014-12-02 LAB — LIPID PANEL
Cholesterol: 166 mg/dL (ref 0–200)
HDL: 61.9 mg/dL (ref >39.00)
LDL Cholesterol: 74 mg/dL (ref 0–99)
NONHDL: 103.73
TRIGLYCERIDES: 151 mg/dL — AB (ref 0.0–149.0)
Total CHOL/HDL Ratio: 3
VLDL: 30.2 mg/dL (ref 0.0–40.0)

## 2014-12-02 LAB — HEPATIC FUNCTION PANEL
ALT: 21 U/L (ref 0–35)
AST: 24 U/L (ref 0–37)
Albumin: 3.7 g/dL (ref 3.5–5.2)
Alkaline Phosphatase: 65 U/L (ref 39–117)
BILIRUBIN TOTAL: 0.5 mg/dL (ref 0.2–1.2)
Bilirubin, Direct: 0.1 mg/dL (ref 0.0–0.3)
Total Protein: 6.4 g/dL (ref 6.0–8.3)

## 2014-12-02 NOTE — Addendum Note (Signed)
Addended by: Eulis Foster on: 12/02/2014 08:51 AM   Modules accepted: Orders

## 2014-12-08 ENCOUNTER — Encounter: Payer: Self-pay | Admitting: Cardiology

## 2014-12-09 DIAGNOSIS — L570 Actinic keratosis: Secondary | ICD-10-CM | POA: Diagnosis not present

## 2014-12-15 ENCOUNTER — Other Ambulatory Visit: Payer: Self-pay | Admitting: Cardiology

## 2015-01-06 ENCOUNTER — Encounter: Payer: Self-pay | Admitting: Cardiology

## 2015-01-18 DIAGNOSIS — Z08 Encounter for follow-up examination after completed treatment for malignant neoplasm: Secondary | ICD-10-CM | POA: Diagnosis not present

## 2015-01-18 DIAGNOSIS — L570 Actinic keratosis: Secondary | ICD-10-CM | POA: Diagnosis not present

## 2015-01-18 DIAGNOSIS — Z85828 Personal history of other malignant neoplasm of skin: Secondary | ICD-10-CM | POA: Diagnosis not present

## 2015-01-18 DIAGNOSIS — D485 Neoplasm of uncertain behavior of skin: Secondary | ICD-10-CM | POA: Diagnosis not present

## 2015-01-20 DIAGNOSIS — Z23 Encounter for immunization: Secondary | ICD-10-CM | POA: Diagnosis not present

## 2015-01-20 DIAGNOSIS — I252 Old myocardial infarction: Secondary | ICD-10-CM | POA: Diagnosis not present

## 2015-01-20 DIAGNOSIS — E780 Pure hypercholesterolemia: Secondary | ICD-10-CM | POA: Diagnosis not present

## 2015-01-20 DIAGNOSIS — Z1389 Encounter for screening for other disorder: Secondary | ICD-10-CM | POA: Diagnosis not present

## 2015-01-20 DIAGNOSIS — M810 Age-related osteoporosis without current pathological fracture: Secondary | ICD-10-CM | POA: Diagnosis not present

## 2015-01-20 DIAGNOSIS — E039 Hypothyroidism, unspecified: Secondary | ICD-10-CM | POA: Diagnosis not present

## 2015-01-20 DIAGNOSIS — R0989 Other specified symptoms and signs involving the circulatory and respiratory systems: Secondary | ICD-10-CM | POA: Diagnosis not present

## 2015-01-20 DIAGNOSIS — I2510 Atherosclerotic heart disease of native coronary artery without angina pectoris: Secondary | ICD-10-CM | POA: Diagnosis not present

## 2015-01-20 DIAGNOSIS — Z0000 Encounter for general adult medical examination without abnormal findings: Secondary | ICD-10-CM | POA: Diagnosis not present

## 2015-01-20 DIAGNOSIS — N183 Chronic kidney disease, stage 3 (moderate): Secondary | ICD-10-CM | POA: Diagnosis not present

## 2015-01-20 DIAGNOSIS — I10 Essential (primary) hypertension: Secondary | ICD-10-CM | POA: Diagnosis not present

## 2015-01-25 ENCOUNTER — Other Ambulatory Visit: Payer: Self-pay | Admitting: Family Medicine

## 2015-01-25 DIAGNOSIS — R0989 Other specified symptoms and signs involving the circulatory and respiratory systems: Secondary | ICD-10-CM

## 2015-01-27 ENCOUNTER — Ambulatory Visit
Admission: RE | Admit: 2015-01-27 | Discharge: 2015-01-27 | Disposition: A | Discharge status: Home / Self Care | Payer: Medicare Other | Source: Ambulatory Visit | Attending: Family Medicine | Admitting: Family Medicine

## 2015-01-27 DIAGNOSIS — I6523 Occlusion and stenosis of bilateral carotid arteries: Secondary | ICD-10-CM | POA: Diagnosis not present

## 2015-01-27 DIAGNOSIS — R0989 Other specified symptoms and signs involving the circulatory and respiratory systems: Secondary | ICD-10-CM

## 2015-02-04 DIAGNOSIS — Z8249 Family history of ischemic heart disease and other diseases of the circulatory system: Secondary | ICD-10-CM | POA: Diagnosis not present

## 2015-02-04 DIAGNOSIS — I959 Hypotension, unspecified: Secondary | ICD-10-CM | POA: Diagnosis not present

## 2015-02-04 DIAGNOSIS — Z7982 Long term (current) use of aspirin: Secondary | ICD-10-CM | POA: Diagnosis not present

## 2015-02-04 DIAGNOSIS — E039 Hypothyroidism, unspecified: Secondary | ICD-10-CM | POA: Diagnosis present

## 2015-02-04 DIAGNOSIS — R531 Weakness: Secondary | ICD-10-CM | POA: Diagnosis not present

## 2015-02-04 DIAGNOSIS — D631 Anemia in chronic kidney disease: Secondary | ICD-10-CM | POA: Diagnosis not present

## 2015-02-04 DIAGNOSIS — N179 Acute kidney failure, unspecified: Secondary | ICD-10-CM | POA: Diagnosis not present

## 2015-02-04 DIAGNOSIS — N170 Acute kidney failure with tubular necrosis: Secondary | ICD-10-CM | POA: Diagnosis present

## 2015-02-04 DIAGNOSIS — R05 Cough: Secondary | ICD-10-CM | POA: Diagnosis not present

## 2015-02-04 DIAGNOSIS — Z992 Dependence on renal dialysis: Secondary | ICD-10-CM | POA: Diagnosis not present

## 2015-02-04 DIAGNOSIS — I10 Essential (primary) hypertension: Secondary | ICD-10-CM | POA: Diagnosis not present

## 2015-02-04 DIAGNOSIS — Z7983 Long term (current) use of bisphosphonates: Secondary | ICD-10-CM | POA: Diagnosis not present

## 2015-02-04 DIAGNOSIS — E876 Hypokalemia: Secondary | ICD-10-CM | POA: Diagnosis not present

## 2015-02-04 DIAGNOSIS — E875 Hyperkalemia: Secondary | ICD-10-CM | POA: Diagnosis not present

## 2015-02-04 DIAGNOSIS — N185 Chronic kidney disease, stage 5: Secondary | ICD-10-CM | POA: Diagnosis not present

## 2015-02-04 DIAGNOSIS — M79609 Pain in unspecified limb: Secondary | ICD-10-CM | POA: Diagnosis not present

## 2015-02-04 DIAGNOSIS — I2510 Atherosclerotic heart disease of native coronary artery without angina pectoris: Secondary | ICD-10-CM | POA: Diagnosis present

## 2015-02-04 DIAGNOSIS — I120 Hypertensive chronic kidney disease with stage 5 chronic kidney disease or end stage renal disease: Secondary | ICD-10-CM | POA: Diagnosis not present

## 2015-02-04 DIAGNOSIS — E860 Dehydration: Secondary | ICD-10-CM | POA: Diagnosis present

## 2015-02-04 DIAGNOSIS — E872 Acidosis: Secondary | ICD-10-CM | POA: Diagnosis not present

## 2015-02-04 DIAGNOSIS — Z66 Do not resuscitate: Secondary | ICD-10-CM | POA: Diagnosis present

## 2015-02-04 DIAGNOSIS — Z87891 Personal history of nicotine dependence: Secondary | ICD-10-CM | POA: Diagnosis not present

## 2015-02-04 DIAGNOSIS — M810 Age-related osteoporosis without current pathological fracture: Secondary | ICD-10-CM | POA: Diagnosis present

## 2015-02-04 DIAGNOSIS — J209 Acute bronchitis, unspecified: Secondary | ICD-10-CM | POA: Diagnosis not present

## 2015-02-04 DIAGNOSIS — N186 End stage renal disease: Secondary | ICD-10-CM | POA: Diagnosis not present

## 2015-02-04 DIAGNOSIS — I252 Old myocardial infarction: Secondary | ICD-10-CM | POA: Diagnosis not present

## 2015-02-04 DIAGNOSIS — D509 Iron deficiency anemia, unspecified: Secondary | ICD-10-CM | POA: Diagnosis present

## 2015-02-04 DIAGNOSIS — E785 Hyperlipidemia, unspecified: Secondary | ICD-10-CM | POA: Diagnosis present

## 2015-02-04 DIAGNOSIS — D649 Anemia, unspecified: Secondary | ICD-10-CM | POA: Diagnosis present

## 2015-02-09 ENCOUNTER — Ambulatory Visit: Payer: Medicare Other | Admitting: Cardiology

## 2015-02-09 DIAGNOSIS — N183 Chronic kidney disease, stage 3 (moderate): Secondary | ICD-10-CM | POA: Diagnosis not present

## 2015-02-09 DIAGNOSIS — N059 Unspecified nephritic syndrome with unspecified morphologic changes: Secondary | ICD-10-CM | POA: Diagnosis not present

## 2015-02-09 DIAGNOSIS — N179 Acute kidney failure, unspecified: Secondary | ICD-10-CM | POA: Diagnosis not present

## 2015-02-10 DIAGNOSIS — M810 Age-related osteoporosis without current pathological fracture: Secondary | ICD-10-CM | POA: Diagnosis not present

## 2015-02-10 DIAGNOSIS — I120 Hypertensive chronic kidney disease with stage 5 chronic kidney disease or end stage renal disease: Secondary | ICD-10-CM | POA: Diagnosis not present

## 2015-02-10 DIAGNOSIS — I252 Old myocardial infarction: Secondary | ICD-10-CM | POA: Diagnosis not present

## 2015-02-10 DIAGNOSIS — N179 Acute kidney failure, unspecified: Secondary | ICD-10-CM | POA: Diagnosis not present

## 2015-02-10 DIAGNOSIS — N183 Chronic kidney disease, stage 3 (moderate): Secondary | ICD-10-CM | POA: Diagnosis not present

## 2015-02-10 DIAGNOSIS — N185 Chronic kidney disease, stage 5: Secondary | ICD-10-CM | POA: Diagnosis not present

## 2015-02-10 DIAGNOSIS — R531 Weakness: Secondary | ICD-10-CM | POA: Diagnosis not present

## 2015-02-10 DIAGNOSIS — G2581 Restless legs syndrome: Secondary | ICD-10-CM | POA: Diagnosis not present

## 2015-02-10 DIAGNOSIS — R05 Cough: Secondary | ICD-10-CM | POA: Diagnosis not present

## 2015-02-14 DIAGNOSIS — R531 Weakness: Secondary | ICD-10-CM | POA: Diagnosis not present

## 2015-02-14 DIAGNOSIS — M810 Age-related osteoporosis without current pathological fracture: Secondary | ICD-10-CM | POA: Diagnosis not present

## 2015-02-14 DIAGNOSIS — N185 Chronic kidney disease, stage 5: Secondary | ICD-10-CM | POA: Diagnosis not present

## 2015-02-14 DIAGNOSIS — I252 Old myocardial infarction: Secondary | ICD-10-CM | POA: Diagnosis not present

## 2015-02-14 DIAGNOSIS — G2581 Restless legs syndrome: Secondary | ICD-10-CM | POA: Diagnosis not present

## 2015-02-14 DIAGNOSIS — I120 Hypertensive chronic kidney disease with stage 5 chronic kidney disease or end stage renal disease: Secondary | ICD-10-CM | POA: Diagnosis not present

## 2015-02-16 DIAGNOSIS — I252 Old myocardial infarction: Secondary | ICD-10-CM | POA: Diagnosis not present

## 2015-02-16 DIAGNOSIS — G2581 Restless legs syndrome: Secondary | ICD-10-CM | POA: Diagnosis not present

## 2015-02-16 DIAGNOSIS — N185 Chronic kidney disease, stage 5: Secondary | ICD-10-CM | POA: Diagnosis not present

## 2015-02-16 DIAGNOSIS — M810 Age-related osteoporosis without current pathological fracture: Secondary | ICD-10-CM | POA: Diagnosis not present

## 2015-02-16 DIAGNOSIS — R531 Weakness: Secondary | ICD-10-CM | POA: Diagnosis not present

## 2015-02-16 DIAGNOSIS — I120 Hypertensive chronic kidney disease with stage 5 chronic kidney disease or end stage renal disease: Secondary | ICD-10-CM | POA: Diagnosis not present

## 2015-02-21 DIAGNOSIS — G2581 Restless legs syndrome: Secondary | ICD-10-CM | POA: Diagnosis not present

## 2015-02-21 DIAGNOSIS — I120 Hypertensive chronic kidney disease with stage 5 chronic kidney disease or end stage renal disease: Secondary | ICD-10-CM | POA: Diagnosis not present

## 2015-02-21 DIAGNOSIS — N185 Chronic kidney disease, stage 5: Secondary | ICD-10-CM | POA: Diagnosis not present

## 2015-02-21 DIAGNOSIS — M810 Age-related osteoporosis without current pathological fracture: Secondary | ICD-10-CM | POA: Diagnosis not present

## 2015-02-21 DIAGNOSIS — R531 Weakness: Secondary | ICD-10-CM | POA: Diagnosis not present

## 2015-02-21 DIAGNOSIS — I252 Old myocardial infarction: Secondary | ICD-10-CM | POA: Diagnosis not present

## 2015-02-22 NOTE — Progress Notes (Signed)
Cardiology Office Note   Date:  [id number]   ID:  Wendy Walters, DOB [id number], MRN [id number]  PCP:  Gerrit Heck, MD    Chief Complaint  Patient presents with  . Coronary Artery Disease  . Hypertension       History of Present Illness: Wendy Walters is a 81 y.o. female with a history of ASCAD s/p PCI of LAD 2001 in setting of AWMI and cath 2005 with patent stent to the LAD, dyslipidemia and HTN. She was recently was hospitalized at [facility] hospital due to dehydration and acute kidney injury.  Her ACE I and ARB were stopped.  She is doing better but still has LE edema.  She denies any chest pain, SOB, DOE, dizziness, palpitations or syncope. She walks for exercise. She has chronic LE edema that she thinks has gotten worse. She does not use table salt    Past Medical History  Diagnosis Date  . Chronic kidney disease     stage 3  . Fibrillary glomerulonephritis     w nephrotic range protenuria, following w neurology, managed with an ACE-I and ARB  . Heart attack 02/03/2000  . Hypothyroidism   . Hyperlipidemia   . Osteoporosis     s/p actonel for about 7-8 years  . Coronary artery disease     s/p PCI 2001 of lad in setting of AWMI-rotational atherectomy with BMS -Dr Radford Pax  . H/O Doppler ultrasound 01/2003    ,39% bilateral carotid stenosis.    Past Surgical History  Procedure Laterality Date  . Cardiac catheterization  2005    patent LAD stent     Current Outpatient Prescriptions  Medication Sig Dispense Refill  . amLODipine (NORVASC) 2.5 MG tablet Take 1 tablet (2.5 mg total) by mouth daily. 90 tablet 2  . Ascorbic Acid (VITAMIN C) 1000 MG tablet Take 1,000 mg by mouth daily.    Marland Kitchen aspirin 81 MG tablet Take 81 mg by mouth daily.    Marland Kitchen atorvastatin (LIPITOR) 80 MG tablet TAKE 1 TABLET EVERY DAY 90 tablet 0  . B Complex Vitamins (VITAMIN B COMPLEX PO) Take by mouth daily.    . calcium carbonate (CALCIUM 600) 600 MG TABS tablet Take  600 mg by mouth daily with breakfast.    . cholecalciferol (VITAMIN D) 1000 UNITS tablet Take by mouth daily.    . Coenzyme Q10 (COQ10) 200 MG CAPS Take by mouth daily.    . ferrous fumarate (HEMOCYTE - 106 MG FE) 325 (106 FE) MG TABS tablet Take 1 tablet by mouth daily.    . furosemide (LASIX) 40 MG tablet Take 1 tablet by mouth 2 (two) times daily.    Marland Kitchen levothyroxine (SYNTHROID, LEVOTHROID) 88 MCG tablet Take 88 mcg by mouth daily before breakfast.    . metoprolol succinate (TOPROL-XL) 50 MG 24 hr tablet TAKE 1 TABLET DAILY. TAKE WITH OR IMMEDIATELY FOLLOWING A MEAL. 90 tablet 1  . Misc Natural Products (OSTEO BI-FLEX JOINT SHIELD PO) Take 2 tablets by mouth daily.    . Multiple Vitamin (MULTIVITAMIN) tablet Take 1 tablet by mouth daily.    . niacin 500 MG tablet Take 500 mg by mouth 2 (two) times daily with a meal.    . Omega-3 Fatty Acids (FISH OIL) 1000 MG CAPS Take 2 capsules by mouth daily.      No current facility-administered medications for this visit.    Allergies:  Codeine    Social History:  The patient  reports that she has quit smoking. She does not have any smokeless tobacco history on file. She reports that she does not drink alcohol or use illicit drugs.   Family History:  The patient's family history includes Cancer - Prostate in her father; Hypertension in her mother.    ROS:  Please see the history of present illness.   Otherwise, review of systems are positive for none.   All other systems are reviewed and negative.    PHYSICAL EXAM: VS:  BP 140/66 mmHg  Pulse 68  Ht 5\' 4"  (1.626 m)  Wt 166 lb (75.297 kg)  BMI 28.48 kg/m2 , BMI Body mass index is 28.48 kg/(m^2). GEN: Well nourished, well developed, in no acute distress HEENT: normal Neck: no JVD, carotid bruits, or masses Cardiac: RRR; no murmurs, rubs, or gallops.  3+ edema past her knees with significant erythema Respiratory:  clear to auscultation bilaterally, normal work of breathing GI: soft,  nontender, nondistended, + BS MS: no deformity or atrophy Skin: warm and dry, no rash Neuro:  Strength and sensation are intact Psych: euthymic mood, full affect   EKG:  EKG is not ordered today.    Recent Labs: 08/23/2014: BUN 23; Creatinine, Ser 1.29*; Potassium 3.5; Sodium 137 12/02/2014: ALT 21    Lipid Panel    Component Value Date/Time   CHOL 166 12/02/2014 0851   TRIG 151.0* 12/02/2014 0851   HDL 61.90 12/02/2014 0851   CHOLHDL 3 12/02/2014 0851   VLDL 30.2 12/02/2014 0851   LDLCALC 74 12/02/2014 0851      Wt Readings from Last 3 Encounters:  02/23/15 166 lb (75.297 kg)  08/11/14 156 lb 12.8 oz (71.124 kg)  02/09/14 157 lb (71.215 kg)    ASSESSMENT AND PLAN:  1. ASCAD with no angina - continue ASA  2. HTN - well controlled - continue Metoprolol/amlodipine        3.  Dyslipidemia with LDL near goal at 74 on 11/2014  -continue atorvastatin/fish oil   4. LE edema - this has worsened and I am concerned that she may have underlying cellulitis.  I will check a BMET to determined renal function to help guide diuretic therapy.  I am going to have her seen by her PCP today to evaluate for possible cellulitis.  I will call her with the dosing of her diuretic once I receive her BMET results    Current medicines are reviewed at length with the patient today.  The patient does not have concerns regarding medicines.  The following changes have been made:  no change  Labs/ tests ordered today: See above Assessment and Plan No orders of the defined types were placed in this encounter.     Disposition:   FU with me in 6 months  Signed, Sueanne Margarita, MD  02/23/2015 8:56 AM    [facility] [place], [place], [place]  [address] Phone: [phone]; Fax: [phone]

## 2015-02-23 ENCOUNTER — Ambulatory Visit (INDEPENDENT_AMBULATORY_CARE_PROVIDER_SITE_OTHER): Payer: Medicare Other | Admitting: Cardiology

## 2015-02-23 VITALS — BP 140/66 | HR 68 | Ht 64.0 in | Wt 166.0 lb

## 2015-02-23 DIAGNOSIS — I10 Essential (primary) hypertension: Secondary | ICD-10-CM

## 2015-02-23 DIAGNOSIS — I8310 Varicose veins of unspecified lower extremity with inflammation: Secondary | ICD-10-CM | POA: Diagnosis not present

## 2015-02-23 DIAGNOSIS — E7800 Pure hypercholesterolemia, unspecified: Secondary | ICD-10-CM | POA: Diagnosis not present

## 2015-02-23 DIAGNOSIS — I2510 Atherosclerotic heart disease of native coronary artery without angina pectoris: Secondary | ICD-10-CM | POA: Diagnosis not present

## 2015-02-23 LAB — BASIC METABOLIC PANEL
BUN: 23 mg/dL (ref 7–25)
CALCIUM: 9.1 mg/dL (ref 8.6–10.4)
CHLORIDE: 90 mmol/L — AB (ref 98–110)
CO2: 23 mmol/L (ref 20–31)
Creat: 1.29 mg/dL — ABNORMAL HIGH (ref 0.60–0.88)
GLUCOSE: 93 mg/dL (ref 65–99)
Potassium: 4.7 mmol/L (ref 3.5–5.3)
SODIUM: 125 mmol/L — AB (ref 135–146)

## 2015-02-23 NOTE — Patient Instructions (Addendum)
Medication Instructions:  We will call you with medication instructions after your lab work is resulted.  Labwork: TODAY: BMET  Testing/Procedures: None  Follow-Up: You have a follow-up appointment at your PCP office TODAY. Please arrive at 1:45 PM for your appointment with [place], [place].  Your physician wants you to follow-up in: 6 months with Dr. Radford Pax. You will receive a reminder letter in the mail two months in advance. If you don't receive a letter, please call our office to schedule the follow-up appointment.   Any Other Special Instructions Will Be Listed Below (If Applicable).

## 2015-02-24 DIAGNOSIS — M810 Age-related osteoporosis without current pathological fracture: Secondary | ICD-10-CM | POA: Diagnosis not present

## 2015-02-24 DIAGNOSIS — I120 Hypertensive chronic kidney disease with stage 5 chronic kidney disease or end stage renal disease: Secondary | ICD-10-CM | POA: Diagnosis not present

## 2015-02-24 DIAGNOSIS — N185 Chronic kidney disease, stage 5: Secondary | ICD-10-CM | POA: Diagnosis not present

## 2015-02-24 DIAGNOSIS — G2581 Restless legs syndrome: Secondary | ICD-10-CM | POA: Diagnosis not present

## 2015-02-24 DIAGNOSIS — R531 Weakness: Secondary | ICD-10-CM | POA: Diagnosis not present

## 2015-02-24 DIAGNOSIS — I252 Old myocardial infarction: Secondary | ICD-10-CM | POA: Diagnosis not present

## 2015-02-25 ENCOUNTER — Encounter: Payer: Self-pay | Admitting: Vascular Surgery

## 2015-02-25 DIAGNOSIS — I8310 Varicose veins of unspecified lower extremity with inflammation: Secondary | ICD-10-CM | POA: Diagnosis not present

## 2015-02-28 DIAGNOSIS — I252 Old myocardial infarction: Secondary | ICD-10-CM | POA: Diagnosis not present

## 2015-02-28 DIAGNOSIS — M810 Age-related osteoporosis without current pathological fracture: Secondary | ICD-10-CM | POA: Diagnosis not present

## 2015-02-28 DIAGNOSIS — I120 Hypertensive chronic kidney disease with stage 5 chronic kidney disease or end stage renal disease: Secondary | ICD-10-CM | POA: Diagnosis not present

## 2015-02-28 DIAGNOSIS — N185 Chronic kidney disease, stage 5: Secondary | ICD-10-CM | POA: Diagnosis not present

## 2015-02-28 DIAGNOSIS — G2581 Restless legs syndrome: Secondary | ICD-10-CM | POA: Diagnosis not present

## 2015-02-28 DIAGNOSIS — R531 Weakness: Secondary | ICD-10-CM | POA: Diagnosis not present

## 2015-03-01 DIAGNOSIS — I8310 Varicose veins of unspecified lower extremity with inflammation: Secondary | ICD-10-CM | POA: Diagnosis not present

## 2015-03-01 DIAGNOSIS — E871 Hypo-osmolality and hyponatremia: Secondary | ICD-10-CM | POA: Diagnosis not present

## 2015-03-02 ENCOUNTER — Ambulatory Visit (INDEPENDENT_AMBULATORY_CARE_PROVIDER_SITE_OTHER): Payer: Medicare Other | Admitting: Vascular Surgery

## 2015-03-02 ENCOUNTER — Encounter: Payer: Self-pay | Admitting: Vascular Surgery

## 2015-03-02 VITALS — BP 142/80 | HR 72 | Temp 98.1°F | Resp 16 | Ht 64.0 in | Wt 159.0 lb

## 2015-03-02 DIAGNOSIS — I779 Disorder of arteries and arterioles, unspecified: Secondary | ICD-10-CM | POA: Diagnosis not present

## 2015-03-02 DIAGNOSIS — I739 Peripheral vascular disease, unspecified: Secondary | ICD-10-CM

## 2015-03-02 DIAGNOSIS — I6523 Occlusion and stenosis of bilateral carotid arteries: Secondary | ICD-10-CM | POA: Diagnosis not present

## 2015-03-02 NOTE — Progress Notes (Signed)
New Carotid Patient  Referred by:  Leighton Ruff, MD [place], [place] [address]  Reason for referral: B carotid stenosis  History of Present Illness  Wendy Walters is a 81 y.o. (21-Jan-1933) female who presents with chief complaint: narrowing in arteries.  Previous carotid studies demonstrated: RICA 0000000 stenosis, LICA 0000000 stenosis.  Patient has no history of TIA or stroke symptom.  The patient has nevern had amaurosis fugax or monocular blindness.  The patient has never had facial drooping or hemiplegia.  The patient has never had receptive or expressive aphasia.   The patient's risks factors for carotid disease include: HLD and prior smoker.  Past Medical History  Diagnosis Date  . Chronic kidney disease     stage 3  . Fibrillary glomerulonephritis     w nephrotic range protenuria, following w neurology, managed with an ACE-I and ARB  . Heart attack ([place]) 02/03/2000  . Hypothyroidism   . Hyperlipidemia   . Osteoporosis     s/p actonel for about 7-8 years  . Coronary artery disease     s/p PCI 2001 of lad in setting of AWMI-rotational atherectomy with BMS -Dr Radford Pax  . H/O Doppler ultrasound 01/2003    ,39% bilateral carotid stenosis.  . Carotid artery occlusion   . Anemia     Past Surgical History  Procedure Laterality Date  . Cardiac catheterization  2005    patent LAD stent  . Angioplasty  2001  . Eye surgery Bilateral 2012    Cataract  . Tonsillectomy  1942    Social History   Social History  . Marital Status: Widowed    Spouse Name: N/A  . Number of Children: N/A  . Years of Education: N/A   Occupational History  . Not on file.   Social History Main Topics  . Smoking status: Former Smoker    Quit date: 06/08/1970  . Smokeless tobacco: Never Used     Comment: quit in 1972  . Alcohol Use: No  . Drug Use: No  . Sexual Activity: Not on file   Other Topics Concern  . Not on file   Social History Narrative     Family  History  Problem Relation Age of Onset  . Hypertension Mother   . Cancer - Prostate Father   . Cancer Father     Prostate  . Diabetes Daughter   . Hypertension Daughter     Current Outpatient Prescriptions  Medication Sig Dispense Refill  . alendronate (FOSAMAX) 70 MG tablet Take 70 mg by mouth once a week. Take with a full glass of water on an empty stomach.    Marland Kitchen amLODipine (NORVASC) 2.5 MG tablet Take 1 tablet (2.5 mg total) by mouth daily. 90 tablet 2  . Ascorbic Acid (VITAMIN C) 1000 MG tablet Take 1,000 mg by mouth daily.    Marland Kitchen aspirin 81 MG tablet Take 81 mg by mouth daily.    Marland Kitchen atorvastatin (LIPITOR) 80 MG tablet TAKE 1 TABLET EVERY DAY 90 tablet 0  . B Complex Vitamins (VITAMIN B COMPLEX PO) Take by mouth daily.    . calcium carbonate (CALCIUM 600) 600 MG TABS tablet Take 600 mg by mouth daily with breakfast.    . cephALEXin (KEFLEX) 500 MG capsule Take 500 mg by mouth 2 (two) times daily. Take one tablet bid for 10 days    . Coenzyme Q10 (COQ10) 200 MG CAPS Take by mouth daily.    . ferrous fumarate (HEMOCYTE -  106 MG FE) 325 (106 FE) MG TABS tablet Take 1 tablet by mouth daily.    . furosemide (LASIX) 40 MG tablet Take 1 tablet by mouth 2 (two) times daily.    Marland Kitchen levothyroxine (SYNTHROID, LEVOTHROID) 88 MCG tablet Take 88 mcg by mouth daily before breakfast.    . magnesium oxide (MAG-OX) 400 MG tablet Take 400 mg by mouth daily.    . metoprolol succinate (TOPROL-XL) 50 MG 24 hr tablet TAKE 1 TABLET DAILY. TAKE WITH OR IMMEDIATELY FOLLOWING A MEAL. 90 tablet 1  . Misc Natural Products (OSTEO BI-FLEX JOINT SHIELD PO) Take 2 tablets by mouth daily.    . Multiple Vitamin (MULTIVITAMIN) tablet Take 1 tablet by mouth daily.    . niacin 500 MG tablet Take 500 mg by mouth 2 (two) times daily with a meal.    . Omega-3 Fatty Acids (FISH OIL) 1000 MG CAPS Take 2 capsules by mouth daily. 1200 two tablets daily    . potassium chloride (K-DUR,KLOR-CON) 10 MEQ tablet Take 10 mEq by mouth 2  (two) times daily.    . Vitamin D, Ergocalciferol, (DRISDOL) 50000 UNITS CAPS capsule Take 50,000 Units by mouth every 7 (seven) days.    . cholecalciferol (VITAMIN D) 1000 UNITS tablet Take by mouth daily.    Marland Kitchen lisinopril (PRINIVIL,ZESTRIL) 40 MG tablet     . magnesium oxide (MAG-OX) 400 (241.3 MG) MG tablet      No current facility-administered medications for this visit.    Allergies  Allergen Reactions  . Codeine Nausea And Vomiting and Nausea Only    REVIEW OF SYSTEMS:  (Positives checked otherwise negative)  CARDIOVASCULAR:   [ ]  chest pain,  [ ]  chest pressure,  [ ]  palpitations,  [ ]  shortness of breath when laying flat,  [ ]  shortness of breath with exertion,   [ ]  pain in feet when walking,  [ ]  pain in feet when laying flat, [ ]  history of blood clot in veins (DVT),  [ ]  history of phlebitis,  [x]  swelling in legs,  [ ]  varicose veins  PULMONARY:   [ ]  productive cough,  [ ]  asthma,  [ ]  wheezing  NEUROLOGIC:   [ ]  weakness in arms or legs,  [ ]  numbness in arms or legs,  [ ]  difficulty speaking or slurred speech,  [ ]  temporary loss of vision in one eye,  [ ]  dizziness  HEMATOLOGIC:   [ ]  bleeding problems,  [ ]  problems with blood clotting too easily  MUSCULOSKEL:   [ ]  joint pain, [ ]  joint swelling  GASTROINTEST:   [ ]  vomiting blood,  [ ]  blood in stool     GENITOURINARY:   [ ]  burning with urination,  [ ]  blood in urine [x]  ARF-resolving  PSYCHIATRIC:   [ ]  history of major depression  INTEGUMENTARY:   [ ]  rashes,  [ ]  ulcers  CONSTITUTIONAL:   [ ]  fever,  [ ]  chills   For VQI Use Only  PRE-ADM LIVING: Home  AMB STATUS: Ambulatory  CAD Sx: History of MI, but no symptoms No MI within 6 months  PRIOR CHF: None  STRESS TEST: [x]  No, [ ]  Normal, [ ]  + ischemia, [ ]  + MI, [ ]  Both   Physical Examination  Filed Vitals:   03/02/15 1123 03/02/15 1129 03/02/15 1141 03/02/15 1145  BP: 146/78 150/80 138/69 142/80  Pulse: 76  75 72   Temp:   98.1 F (36.7 C)  TempSrc:   Oral   Resp:  16    Height:  5\' 4"  (1.626 m)    Weight:  159 lb (72.122 kg)    SpO2:  100%     Body mass index is 27.28 kg/(m^2).  General: A&O x 3, WD, thin  Head: [place]/AT  Ear/Nose/Throat: Hearing grossly intact, nares w/o erythema or drainage, oropharynx w/o Erythema/Exudate, Mallampati score: 3  Eyes: PERRLA, EOMI  Neck: Supple, no nuchal rigidity, no palpable LAD  Pulmonary: Sym exp, good air movt, CTAB, no rales, rhonchi, & wheezingno, + rales,   Cardiac: RRR, Nl S1, S2, no Murmurs, rubs or gallops  Vascular: Vessel Right Left  Radial Palpable Palpable  Brachial Palpable Palpable  Carotid Palpable, without bruit Palpable, without bruit  Aorta  Not palpable N/A  Femoral Palpable Palpable  Popliteal Not palpable Not palpable  PT Not Palpable Not Palpable  DP Palpable Faintly Palpable   Gastrointestinal: soft, NTND, -G/R, - HSM, - masses, - CVAT B  Musculoskeletal: M/S 5/5 throughout , Extremities without ischemic changes , B 2+ edema  Neurologic: CN 2-12 intact , Pain and light touch intact in extremities , Motor exam as listed above  Psychiatric: Judgment intact, Mood & affect appropriate for pt's clinical situation  Dermatologic: See M/S exam for extremity exam, no rashes otherwise noted  Lymph : No Cervical, Axillary, or Inguinal lymphadenopathy    Non-Invasive Vascular Imaging  Outside B CAROTID DUPLEX (Date: 01/27/15):   R ICA stenosis: 50-69%  R VA: patent and antegrade  L ICA stenosis: 50-69%  L VA: patent and antegrade   Outside Studies/Documentation 10 pages of outside documents were reviewed including: outside PCP chart and Outside B carotid duplex.   Medical Decision Making  Wendy Walters is a 81 y.o. female who presents with: B asx ICA stenosis 50-69%.   Based on the patient's vascular studies and examination, I have offered the patient: q6 month B carotid duplex.  I discussed in depth  with the patient the nature of atherosclerosis, and emphasized the importance of maximal medical management including strict control of blood pressure, blood glucose, and lipid levels, obtaining regular exercise, antiplatelet agents, and cessation of smoking.   The patient is currently on a statin: Lipitor.  The patient is currently on an anti-platelet: ASA.  The patient is aware that without maximal medical management the underlying atherosclerotic disease process will progress, limiting the benefit of any interventions.  Thank you for allowing [place] to participate in this patient's care.   Adele Barthel, MD [facility] Office: [phone] Pager: [phone]  03/02/2015, 12:05 PM

## 2015-03-02 NOTE — Addendum Note (Signed)
Addended by: Mena Goes on: 03/02/2015 02:31 PM   Modules accepted: Orders

## 2015-03-03 DIAGNOSIS — R531 Weakness: Secondary | ICD-10-CM | POA: Diagnosis not present

## 2015-03-03 DIAGNOSIS — I120 Hypertensive chronic kidney disease with stage 5 chronic kidney disease or end stage renal disease: Secondary | ICD-10-CM | POA: Diagnosis not present

## 2015-03-03 DIAGNOSIS — I252 Old myocardial infarction: Secondary | ICD-10-CM | POA: Diagnosis not present

## 2015-03-03 DIAGNOSIS — N185 Chronic kidney disease, stage 5: Secondary | ICD-10-CM | POA: Diagnosis not present

## 2015-03-03 DIAGNOSIS — M810 Age-related osteoporosis without current pathological fracture: Secondary | ICD-10-CM | POA: Diagnosis not present

## 2015-03-03 DIAGNOSIS — G2581 Restless legs syndrome: Secondary | ICD-10-CM | POA: Diagnosis not present

## 2015-03-04 DIAGNOSIS — I8310 Varicose veins of unspecified lower extremity with inflammation: Secondary | ICD-10-CM | POA: Diagnosis not present

## 2015-03-04 DIAGNOSIS — E875 Hyperkalemia: Secondary | ICD-10-CM | POA: Diagnosis not present

## 2015-03-04 DIAGNOSIS — N185 Chronic kidney disease, stage 5: Secondary | ICD-10-CM | POA: Diagnosis not present

## 2015-03-04 DIAGNOSIS — I120 Hypertensive chronic kidney disease with stage 5 chronic kidney disease or end stage renal disease: Secondary | ICD-10-CM | POA: Diagnosis not present

## 2015-03-04 DIAGNOSIS — R531 Weakness: Secondary | ICD-10-CM | POA: Diagnosis not present

## 2015-03-04 DIAGNOSIS — N040 Nephrotic syndrome with minor glomerular abnormality: Secondary | ICD-10-CM | POA: Diagnosis not present

## 2015-03-04 DIAGNOSIS — T502X5A Adverse effect of carbonic-anhydrase inhibitors, benzothiadiazides and other diuretics, initial encounter: Secondary | ICD-10-CM | POA: Diagnosis not present

## 2015-03-04 DIAGNOSIS — E871 Hypo-osmolality and hyponatremia: Secondary | ICD-10-CM | POA: Diagnosis not present

## 2015-03-05 DIAGNOSIS — Z66 Do not resuscitate: Secondary | ICD-10-CM | POA: Diagnosis present

## 2015-03-05 DIAGNOSIS — I272 Other secondary pulmonary hypertension: Secondary | ICD-10-CM | POA: Diagnosis not present

## 2015-03-05 DIAGNOSIS — R531 Weakness: Secondary | ICD-10-CM | POA: Diagnosis not present

## 2015-03-05 DIAGNOSIS — D649 Anemia, unspecified: Secondary | ICD-10-CM | POA: Diagnosis not present

## 2015-03-05 DIAGNOSIS — N040 Nephrotic syndrome with minor glomerular abnormality: Secondary | ICD-10-CM | POA: Diagnosis present

## 2015-03-05 DIAGNOSIS — Z992 Dependence on renal dialysis: Secondary | ICD-10-CM | POA: Diagnosis not present

## 2015-03-05 DIAGNOSIS — I252 Old myocardial infarction: Secondary | ICD-10-CM | POA: Diagnosis not present

## 2015-03-05 DIAGNOSIS — I361 Nonrheumatic tricuspid (valve) insufficiency: Secondary | ICD-10-CM | POA: Diagnosis not present

## 2015-03-05 DIAGNOSIS — Z8249 Family history of ischemic heart disease and other diseases of the circulatory system: Secondary | ICD-10-CM | POA: Diagnosis not present

## 2015-03-05 DIAGNOSIS — Z87448 Personal history of other diseases of urinary system: Secondary | ICD-10-CM | POA: Diagnosis not present

## 2015-03-05 DIAGNOSIS — N185 Chronic kidney disease, stage 5: Secondary | ICD-10-CM | POA: Diagnosis not present

## 2015-03-05 DIAGNOSIS — E039 Hypothyroidism, unspecified: Secondary | ICD-10-CM | POA: Diagnosis not present

## 2015-03-05 DIAGNOSIS — R918 Other nonspecific abnormal finding of lung field: Secondary | ICD-10-CM | POA: Diagnosis present

## 2015-03-05 DIAGNOSIS — E875 Hyperkalemia: Secondary | ICD-10-CM | POA: Diagnosis not present

## 2015-03-05 DIAGNOSIS — I120 Hypertensive chronic kidney disease with stage 5 chronic kidney disease or end stage renal disease: Secondary | ICD-10-CM | POA: Diagnosis not present

## 2015-03-05 DIAGNOSIS — R600 Localized edema: Secondary | ICD-10-CM | POA: Diagnosis not present

## 2015-03-05 DIAGNOSIS — F17210 Nicotine dependence, cigarettes, uncomplicated: Secondary | ICD-10-CM | POA: Diagnosis present

## 2015-03-05 DIAGNOSIS — E860 Dehydration: Secondary | ICD-10-CM | POA: Diagnosis present

## 2015-03-05 DIAGNOSIS — G5603 Carpal tunnel syndrome, bilateral upper limbs: Secondary | ICD-10-CM | POA: Diagnosis present

## 2015-03-05 DIAGNOSIS — E872 Acidosis: Secondary | ICD-10-CM | POA: Diagnosis present

## 2015-03-05 DIAGNOSIS — E871 Hypo-osmolality and hyponatremia: Secondary | ICD-10-CM | POA: Diagnosis not present

## 2015-03-05 DIAGNOSIS — T502X5A Adverse effect of carbonic-anhydrase inhibitors, benzothiadiazides and other diuretics, initial encounter: Secondary | ICD-10-CM | POA: Diagnosis present

## 2015-03-05 DIAGNOSIS — D509 Iron deficiency anemia, unspecified: Secondary | ICD-10-CM | POA: Diagnosis present

## 2015-03-05 DIAGNOSIS — M810 Age-related osteoporosis without current pathological fracture: Secondary | ICD-10-CM | POA: Diagnosis present

## 2015-03-05 DIAGNOSIS — N059 Unspecified nephritic syndrome with unspecified morphologic changes: Secondary | ICD-10-CM | POA: Diagnosis not present

## 2015-03-05 DIAGNOSIS — Z23 Encounter for immunization: Secondary | ICD-10-CM | POA: Diagnosis not present

## 2015-03-05 DIAGNOSIS — N186 End stage renal disease: Secondary | ICD-10-CM | POA: Diagnosis not present

## 2015-03-05 DIAGNOSIS — J8410 Pulmonary fibrosis, unspecified: Secondary | ICD-10-CM | POA: Diagnosis present

## 2015-03-05 DIAGNOSIS — N183 Chronic kidney disease, stage 3 (moderate): Secondary | ICD-10-CM | POA: Diagnosis not present

## 2015-03-05 DIAGNOSIS — Z780 Asymptomatic menopausal state: Secondary | ICD-10-CM | POA: Diagnosis not present

## 2015-03-05 DIAGNOSIS — D631 Anemia in chronic kidney disease: Secondary | ICD-10-CM | POA: Diagnosis not present

## 2015-03-05 DIAGNOSIS — I10 Essential (primary) hypertension: Secondary | ICD-10-CM | POA: Diagnosis not present

## 2015-03-05 DIAGNOSIS — E7800 Pure hypercholesterolemia, unspecified: Secondary | ICD-10-CM | POA: Diagnosis present

## 2015-03-05 DIAGNOSIS — I2510 Atherosclerotic heart disease of native coronary artery without angina pectoris: Secondary | ICD-10-CM | POA: Diagnosis present

## 2015-03-05 DIAGNOSIS — R609 Edema, unspecified: Secondary | ICD-10-CM | POA: Diagnosis not present

## 2015-03-06 DIAGNOSIS — E871 Hypo-osmolality and hyponatremia: Secondary | ICD-10-CM | POA: Diagnosis not present

## 2015-03-07 DIAGNOSIS — E871 Hypo-osmolality and hyponatremia: Secondary | ICD-10-CM | POA: Diagnosis not present

## 2015-03-09 ENCOUNTER — Other Ambulatory Visit: Payer: Self-pay | Admitting: Cardiology

## 2015-03-10 DIAGNOSIS — N185 Chronic kidney disease, stage 5: Secondary | ICD-10-CM | POA: Diagnosis not present

## 2015-03-10 DIAGNOSIS — I252 Old myocardial infarction: Secondary | ICD-10-CM | POA: Diagnosis not present

## 2015-03-10 DIAGNOSIS — R531 Weakness: Secondary | ICD-10-CM | POA: Diagnosis not present

## 2015-03-10 DIAGNOSIS — M810 Age-related osteoporosis without current pathological fracture: Secondary | ICD-10-CM | POA: Diagnosis not present

## 2015-03-10 DIAGNOSIS — G2581 Restless legs syndrome: Secondary | ICD-10-CM | POA: Diagnosis not present

## 2015-03-10 DIAGNOSIS — I120 Hypertensive chronic kidney disease with stage 5 chronic kidney disease or end stage renal disease: Secondary | ICD-10-CM | POA: Diagnosis not present

## 2015-03-11 DIAGNOSIS — N185 Chronic kidney disease, stage 5: Secondary | ICD-10-CM | POA: Diagnosis not present

## 2015-03-11 DIAGNOSIS — R531 Weakness: Secondary | ICD-10-CM | POA: Diagnosis not present

## 2015-03-11 DIAGNOSIS — G2581 Restless legs syndrome: Secondary | ICD-10-CM | POA: Diagnosis not present

## 2015-03-11 DIAGNOSIS — I252 Old myocardial infarction: Secondary | ICD-10-CM | POA: Diagnosis not present

## 2015-03-11 DIAGNOSIS — M810 Age-related osteoporosis without current pathological fracture: Secondary | ICD-10-CM | POA: Diagnosis not present

## 2015-03-11 DIAGNOSIS — I120 Hypertensive chronic kidney disease with stage 5 chronic kidney disease or end stage renal disease: Secondary | ICD-10-CM | POA: Diagnosis not present

## 2015-03-14 DIAGNOSIS — I120 Hypertensive chronic kidney disease with stage 5 chronic kidney disease or end stage renal disease: Secondary | ICD-10-CM | POA: Diagnosis not present

## 2015-03-14 DIAGNOSIS — G2581 Restless legs syndrome: Secondary | ICD-10-CM | POA: Diagnosis not present

## 2015-03-14 DIAGNOSIS — M810 Age-related osteoporosis without current pathological fracture: Secondary | ICD-10-CM | POA: Diagnosis not present

## 2015-03-14 DIAGNOSIS — N185 Chronic kidney disease, stage 5: Secondary | ICD-10-CM | POA: Diagnosis not present

## 2015-03-14 DIAGNOSIS — I252 Old myocardial infarction: Secondary | ICD-10-CM | POA: Diagnosis not present

## 2015-03-14 DIAGNOSIS — R531 Weakness: Secondary | ICD-10-CM | POA: Diagnosis not present

## 2015-03-16 DIAGNOSIS — D649 Anemia, unspecified: Secondary | ICD-10-CM | POA: Diagnosis not present

## 2015-03-16 DIAGNOSIS — N183 Chronic kidney disease, stage 3 (moderate): Secondary | ICD-10-CM | POA: Diagnosis not present

## 2015-03-16 DIAGNOSIS — N059 Unspecified nephritic syndrome with unspecified morphologic changes: Secondary | ICD-10-CM | POA: Diagnosis not present

## 2015-03-16 DIAGNOSIS — R609 Edema, unspecified: Secondary | ICD-10-CM | POA: Diagnosis not present

## 2015-03-16 DIAGNOSIS — N179 Acute kidney failure, unspecified: Secondary | ICD-10-CM | POA: Diagnosis not present

## 2015-03-17 DIAGNOSIS — R809 Proteinuria, unspecified: Secondary | ICD-10-CM | POA: Diagnosis not present

## 2015-03-17 DIAGNOSIS — N183 Chronic kidney disease, stage 3 (moderate): Secondary | ICD-10-CM | POA: Diagnosis not present

## 2015-03-17 DIAGNOSIS — N390 Urinary tract infection, site not specified: Secondary | ICD-10-CM | POA: Diagnosis not present

## 2015-03-17 DIAGNOSIS — I10 Essential (primary) hypertension: Secondary | ICD-10-CM | POA: Diagnosis not present

## 2015-03-17 DIAGNOSIS — D631 Anemia in chronic kidney disease: Secondary | ICD-10-CM | POA: Diagnosis not present

## 2015-03-18 DIAGNOSIS — R531 Weakness: Secondary | ICD-10-CM | POA: Diagnosis not present

## 2015-03-18 DIAGNOSIS — M810 Age-related osteoporosis without current pathological fracture: Secondary | ICD-10-CM | POA: Diagnosis not present

## 2015-03-18 DIAGNOSIS — G2581 Restless legs syndrome: Secondary | ICD-10-CM | POA: Diagnosis not present

## 2015-03-18 DIAGNOSIS — I252 Old myocardial infarction: Secondary | ICD-10-CM | POA: Diagnosis not present

## 2015-03-18 DIAGNOSIS — I120 Hypertensive chronic kidney disease with stage 5 chronic kidney disease or end stage renal disease: Secondary | ICD-10-CM | POA: Diagnosis not present

## 2015-03-18 DIAGNOSIS — N185 Chronic kidney disease, stage 5: Secondary | ICD-10-CM | POA: Diagnosis not present

## 2015-03-22 DIAGNOSIS — I120 Hypertensive chronic kidney disease with stage 5 chronic kidney disease or end stage renal disease: Secondary | ICD-10-CM | POA: Diagnosis not present

## 2015-03-22 DIAGNOSIS — N185 Chronic kidney disease, stage 5: Secondary | ICD-10-CM | POA: Diagnosis not present

## 2015-03-22 DIAGNOSIS — R531 Weakness: Secondary | ICD-10-CM | POA: Diagnosis not present

## 2015-03-22 DIAGNOSIS — I252 Old myocardial infarction: Secondary | ICD-10-CM | POA: Diagnosis not present

## 2015-03-22 DIAGNOSIS — M810 Age-related osteoporosis without current pathological fracture: Secondary | ICD-10-CM | POA: Diagnosis not present

## 2015-03-22 DIAGNOSIS — G2581 Restless legs syndrome: Secondary | ICD-10-CM | POA: Diagnosis not present

## 2015-03-24 DIAGNOSIS — G2581 Restless legs syndrome: Secondary | ICD-10-CM | POA: Diagnosis not present

## 2015-03-24 DIAGNOSIS — M810 Age-related osteoporosis without current pathological fracture: Secondary | ICD-10-CM | POA: Diagnosis not present

## 2015-03-24 DIAGNOSIS — R531 Weakness: Secondary | ICD-10-CM | POA: Diagnosis not present

## 2015-03-24 DIAGNOSIS — I120 Hypertensive chronic kidney disease with stage 5 chronic kidney disease or end stage renal disease: Secondary | ICD-10-CM | POA: Diagnosis not present

## 2015-03-24 DIAGNOSIS — I252 Old myocardial infarction: Secondary | ICD-10-CM | POA: Diagnosis not present

## 2015-03-24 DIAGNOSIS — N185 Chronic kidney disease, stage 5: Secondary | ICD-10-CM | POA: Diagnosis not present

## 2015-03-29 DIAGNOSIS — R531 Weakness: Secondary | ICD-10-CM | POA: Diagnosis not present

## 2015-03-29 DIAGNOSIS — G2581 Restless legs syndrome: Secondary | ICD-10-CM | POA: Diagnosis not present

## 2015-03-29 DIAGNOSIS — N185 Chronic kidney disease, stage 5: Secondary | ICD-10-CM | POA: Diagnosis not present

## 2015-03-29 DIAGNOSIS — I252 Old myocardial infarction: Secondary | ICD-10-CM | POA: Diagnosis not present

## 2015-03-29 DIAGNOSIS — I120 Hypertensive chronic kidney disease with stage 5 chronic kidney disease or end stage renal disease: Secondary | ICD-10-CM | POA: Diagnosis not present

## 2015-03-29 DIAGNOSIS — M810 Age-related osteoporosis without current pathological fracture: Secondary | ICD-10-CM | POA: Diagnosis not present

## 2015-04-01 DIAGNOSIS — G2581 Restless legs syndrome: Secondary | ICD-10-CM | POA: Diagnosis not present

## 2015-04-01 DIAGNOSIS — I120 Hypertensive chronic kidney disease with stage 5 chronic kidney disease or end stage renal disease: Secondary | ICD-10-CM | POA: Diagnosis not present

## 2015-04-01 DIAGNOSIS — R531 Weakness: Secondary | ICD-10-CM | POA: Diagnosis not present

## 2015-04-01 DIAGNOSIS — I252 Old myocardial infarction: Secondary | ICD-10-CM | POA: Diagnosis not present

## 2015-04-01 DIAGNOSIS — N185 Chronic kidney disease, stage 5: Secondary | ICD-10-CM | POA: Diagnosis not present

## 2015-04-01 DIAGNOSIS — M810 Age-related osteoporosis without current pathological fracture: Secondary | ICD-10-CM | POA: Diagnosis not present

## 2015-04-18 DIAGNOSIS — R809 Proteinuria, unspecified: Secondary | ICD-10-CM | POA: Diagnosis not present

## 2015-04-28 DIAGNOSIS — I10 Essential (primary) hypertension: Secondary | ICD-10-CM | POA: Diagnosis not present

## 2015-04-28 DIAGNOSIS — N183 Chronic kidney disease, stage 3 (moderate): Secondary | ICD-10-CM | POA: Diagnosis not present

## 2015-05-03 DIAGNOSIS — N183 Chronic kidney disease, stage 3 (moderate): Secondary | ICD-10-CM | POA: Diagnosis not present

## 2015-05-03 DIAGNOSIS — D631 Anemia in chronic kidney disease: Secondary | ICD-10-CM | POA: Diagnosis not present

## 2015-05-03 DIAGNOSIS — N390 Urinary tract infection, site not specified: Secondary | ICD-10-CM | POA: Diagnosis not present

## 2015-05-03 DIAGNOSIS — I10 Essential (primary) hypertension: Secondary | ICD-10-CM | POA: Diagnosis not present

## 2015-05-04 ENCOUNTER — Encounter: Payer: Self-pay | Admitting: *Deleted

## 2015-06-02 DIAGNOSIS — I10 Essential (primary) hypertension: Secondary | ICD-10-CM | POA: Diagnosis not present

## 2015-06-02 DIAGNOSIS — R809 Proteinuria, unspecified: Secondary | ICD-10-CM | POA: Diagnosis not present

## 2015-06-08 DIAGNOSIS — R809 Proteinuria, unspecified: Secondary | ICD-10-CM | POA: Diagnosis not present

## 2015-06-08 DIAGNOSIS — D631 Anemia in chronic kidney disease: Secondary | ICD-10-CM | POA: Diagnosis not present

## 2015-06-08 DIAGNOSIS — N183 Chronic kidney disease, stage 3 (moderate): Secondary | ICD-10-CM | POA: Diagnosis not present

## 2015-06-08 DIAGNOSIS — N390 Urinary tract infection, site not specified: Secondary | ICD-10-CM | POA: Diagnosis not present

## 2015-06-08 DIAGNOSIS — I10 Essential (primary) hypertension: Secondary | ICD-10-CM | POA: Diagnosis not present

## 2015-06-15 DIAGNOSIS — H52222 Regular astigmatism, left eye: Secondary | ICD-10-CM | POA: Diagnosis not present

## 2015-06-15 DIAGNOSIS — H5211 Myopia, right eye: Secondary | ICD-10-CM | POA: Diagnosis not present

## 2015-06-15 DIAGNOSIS — H524 Presbyopia: Secondary | ICD-10-CM | POA: Diagnosis not present

## 2015-06-15 DIAGNOSIS — D3131 Benign neoplasm of right choroid: Secondary | ICD-10-CM | POA: Diagnosis not present

## 2015-06-15 DIAGNOSIS — H35372 Puckering of macula, left eye: Secondary | ICD-10-CM | POA: Diagnosis not present

## 2015-06-15 DIAGNOSIS — H52221 Regular astigmatism, right eye: Secondary | ICD-10-CM | POA: Diagnosis not present

## 2015-06-15 DIAGNOSIS — H26493 Other secondary cataract, bilateral: Secondary | ICD-10-CM | POA: Diagnosis not present

## 2015-07-06 DIAGNOSIS — D631 Anemia in chronic kidney disease: Secondary | ICD-10-CM | POA: Diagnosis not present

## 2015-07-06 DIAGNOSIS — N183 Chronic kidney disease, stage 3 (moderate): Secondary | ICD-10-CM | POA: Diagnosis not present

## 2015-07-06 DIAGNOSIS — I10 Essential (primary) hypertension: Secondary | ICD-10-CM | POA: Diagnosis not present

## 2015-07-06 DIAGNOSIS — N390 Urinary tract infection, site not specified: Secondary | ICD-10-CM | POA: Diagnosis not present

## 2015-07-06 DIAGNOSIS — R809 Proteinuria, unspecified: Secondary | ICD-10-CM | POA: Diagnosis not present

## 2015-08-12 DIAGNOSIS — L570 Actinic keratosis: Secondary | ICD-10-CM | POA: Diagnosis not present

## 2015-08-12 DIAGNOSIS — D485 Neoplasm of uncertain behavior of skin: Secondary | ICD-10-CM | POA: Diagnosis not present

## 2015-08-12 DIAGNOSIS — C44311 Basal cell carcinoma of skin of nose: Secondary | ICD-10-CM | POA: Diagnosis not present

## 2015-08-22 ENCOUNTER — Ambulatory Visit (INDEPENDENT_AMBULATORY_CARE_PROVIDER_SITE_OTHER): Payer: Medicare Other | Admitting: Cardiology

## 2015-08-22 ENCOUNTER — Encounter: Payer: Self-pay | Admitting: Cardiology

## 2015-08-22 VITALS — BP 164/88 | HR 52 | Ht 64.0 in | Wt 149.8 lb

## 2015-08-22 DIAGNOSIS — E7800 Pure hypercholesterolemia, unspecified: Secondary | ICD-10-CM

## 2015-08-22 DIAGNOSIS — I779 Disorder of arteries and arterioles, unspecified: Secondary | ICD-10-CM

## 2015-08-22 DIAGNOSIS — I10 Essential (primary) hypertension: Secondary | ICD-10-CM

## 2015-08-22 DIAGNOSIS — I6529 Occlusion and stenosis of unspecified carotid artery: Secondary | ICD-10-CM

## 2015-08-22 DIAGNOSIS — R001 Bradycardia, unspecified: Secondary | ICD-10-CM | POA: Insufficient documentation

## 2015-08-22 DIAGNOSIS — I2510 Atherosclerotic heart disease of native coronary artery without angina pectoris: Secondary | ICD-10-CM | POA: Diagnosis not present

## 2015-08-22 DIAGNOSIS — I739 Peripheral vascular disease, unspecified: Secondary | ICD-10-CM

## 2015-08-22 HISTORY — DX: Bradycardia, unspecified: R00.1

## 2015-08-22 LAB — LIPID PANEL
CHOLESTEROL: 158 mg/dL (ref 125–200)
HDL: 55 mg/dL (ref >=46)
LDL Cholesterol: 79 mg/dL (ref <130)
TRIGLYCERIDES: 121 mg/dL (ref <150)
Total CHOL/HDL Ratio: 2.9 Ratio (ref <=5.0)
VLDL: 24 mg/dL (ref <30)

## 2015-08-22 LAB — HEPATIC FUNCTION PANEL
ALBUMIN: 3.5 g/dL — AB (ref 3.6–5.1)
ALT: 19 U/L (ref 6–29)
AST: 31 U/L (ref 10–35)
Alkaline Phosphatase: 86 U/L (ref 33–130)
BILIRUBIN DIRECT: 0.1 mg/dL (ref <=0.2)
Indirect Bilirubin: 0.3 mg/dL (ref 0.2–1.2)
TOTAL PROTEIN: 5.7 g/dL — AB (ref 6.1–8.1)
Total Bilirubin: 0.4 mg/dL (ref 0.2–1.2)

## 2015-08-22 LAB — BASIC METABOLIC PANEL
BUN: 40 mg/dL — AB (ref 7–25)
CHLORIDE: 102 mmol/L (ref 98–110)
CO2: 29 mmol/L (ref 20–31)
CREATININE: 1.72 mg/dL — AB (ref 0.60–0.88)
Calcium: 9.4 mg/dL (ref 8.6–10.4)
GLUCOSE: 97 mg/dL (ref 65–99)
POTASSIUM: 4.1 mmol/L (ref 3.5–5.3)
Sodium: 142 mmol/L (ref 135–146)

## 2015-08-22 NOTE — Patient Instructions (Addendum)
Medication Instructions:  Your physician recommends that you continue on your current medications as directed. Please refer to the Current Medication list given to you today.   Labwork: TODAY: BMET, LFTs, Lipids  Testing/Procedures: None  Follow-Up: Your physician wants you to follow-up in: 1 year with Dr. Radford Pax. You will receive a reminder letter in the mail two months in advance. If you don't receive a letter, please call our office to schedule the follow-up appointment.   Any Other Special Instructions Will Be Listed Below (If Applicable). Please check your BLOOD PRESSURE daily for one week and call with results.    If you need a refill on your cardiac medications before your next appointment, please call your pharmacy.

## 2015-08-22 NOTE — Progress Notes (Signed)
Cardiology Office Note    Date:  [id number]   ID:  Wendy Walters, DOB [id number], MRN [id number]  PCP:  Gerrit Heck, MD  Cardiologist:  Sueanne Margarita, MD   Chief Complaint  Patient presents with  . Coronary Artery Disease  . Hypertension  . Hyperlipidemia    History of Present Illness:  Wendy Walters is a 82 y.o. female with a history of ASCAD s/p PCI of LAD 2001 in setting of AWMI and cath 2005 with patent stent to the LAD, dyslipidemia, carotid artery stenosis and HTN.  She denies any chest pain, SOB, DOE, dizziness, palpitations or syncope. She walks for exercise 2 miles daily for an hour. She has chronic LE edema that she thinks has gotten better since the last time I saw her. She does not use table salt    Past Medical History  Diagnosis Date  . Chronic kidney disease     stage 3  . Fibrillary glomerulonephritis     w nephrotic range protenuria, following w neurology, managed with an ACE-I and ARB  . Heart attack ([place]) 02/03/2000  . Hypothyroidism   . Hyperlipidemia   . Osteoporosis     s/p actonel for about 7-8 years  . Coronary artery disease     s/p PCI 2001 of lad in setting of AWMI-rotational atherectomy with BMS -Dr Radford Pax  . H/O Doppler ultrasound 01/2003    ,39% bilateral carotid stenosis.  . Carotid artery occlusion   . Anemia   . Bradycardia 08/22/2015    Past Surgical History  Procedure Laterality Date  . Cardiac catheterization  2005    patent LAD stent  . Angioplasty  2001  . Eye surgery Bilateral 2012    Cataract  . Tonsillectomy  1942    Current Medications: Outpatient Prescriptions Prior to Visit  Medication Sig Dispense Refill  . alendronate (FOSAMAX) 70 MG tablet Take 70 mg by mouth once a week. Take with a full glass of water on an empty stomach.    Marland Kitchen amLODipine (NORVASC) 2.5 MG tablet Take 1 tablet (2.5 mg total) by mouth daily. 90 tablet 2  . Ascorbic Acid (VITAMIN C) 1000 MG tablet Take 1,000 mg by mouth daily.     Marland Kitchen aspirin 81 MG tablet Take 81 mg by mouth daily.    Marland Kitchen atorvastatin (LIPITOR) 80 MG tablet TAKE 1 TABLET EVERY DAY 90 tablet 0  . B Complex Vitamins (VITAMIN B COMPLEX PO) Take by mouth daily.    . calcium carbonate (CALCIUM 600) 600 MG TABS tablet Take 600 mg by mouth daily with breakfast.    . cephALEXin (KEFLEX) 500 MG capsule Take 500 mg by mouth 2 (two) times daily. Take one tablet bid for 10 days    . cholecalciferol (VITAMIN D) 1000 UNITS tablet Take 1,000 Units by mouth 2 (two) times daily.     . Coenzyme Q10 (COQ10) 200 MG CAPS Take by mouth daily.    . ferrous fumarate (HEMOCYTE - 106 MG FE) 325 (106 FE) MG TABS tablet Take 1 tablet by mouth daily.    Marland Kitchen levothyroxine (SYNTHROID, LEVOTHROID) 88 MCG tablet Take 88 mcg by mouth daily before breakfast.    . lisinopril (PRINIVIL,ZESTRIL) 40 MG tablet Take 40 mg by mouth 2 (two) times daily.     . Misc Natural Products (OSTEO BI-FLEX JOINT SHIELD PO) Take 2 tablets by mouth daily.    . Multiple Vitamin (MULTIVITAMIN) tablet Take 1 tablet by mouth daily.    . niacin 500  MG tablet Take 500 mg by mouth 2 (two) times daily with a meal.    . Omega-3 Fatty Acids (FISH OIL) 1000 MG CAPS Take 2 capsules by mouth daily. 1200 two tablets daily    . Vitamin D, Ergocalciferol, (DRISDOL) 50000 UNITS CAPS capsule Take 50,000 Units by mouth every 7 (seven) days.    . furosemide (LASIX) 40 MG tablet Take 1 tablet by mouth 2 (two) times daily. Reported on 08/22/2015    . magnesium oxide (MAG-OX) 400 (241.3 MG) MG tablet Reported on 08/22/2015    . magnesium oxide (MAG-OX) 400 MG tablet Take 400 mg by mouth daily. Reported on 08/22/2015    . metoprolol succinate (TOPROL-XL) 50 MG 24 hr tablet Take 1 tablet (50 mg total) by mouth daily. Take with or following a meal (Patient not taking: Reported on 08/22/2015) 90 tablet 3  . potassium chloride (K-DUR,KLOR-CON) 10 MEQ tablet Take 10 mEq by mouth 2 (two) times daily. Reported on 08/22/2015     No  facility-administered medications prior to visit.     Allergies:   Codeine   Social History   Social History  . Marital Status: Widowed    Spouse Name: N/A  . Number of Children: N/A  . Years of Education: N/A   Social History Main Topics  . Smoking status: Former Smoker    Quit date: 06/08/1970  . Smokeless tobacco: Never Used     Comment: quit in 1972  . Alcohol Use: No  . Drug Use: No  . Sexual Activity: Not Asked   Other Topics Concern  . None   Social History Narrative     Family History:  The patient's family history includes Cancer in her father; Cancer - Prostate in her father; Diabetes in her daughter; Hypertension in her daughter and mother.   ROS:   Please see the history of present illness.    ROS All other systems reviewed and are negative.   PHYSICAL EXAM:   VS:  BP 164/88 mmHg  Pulse 52  Ht 5\' 4"  (1.626 m)  Wt 149 lb 12.8 oz (67.949 kg)  BMI 25.70 kg/m2   GEN: Well nourished, well developed, in no acute distress HEENT: normal Neck: no JVD, carotid bruits, or masses Cardiac: RRR; no murmurs, rubs, or gallops,no edema.  Intact distal pulses bilaterally.  Respiratory:  clear to auscultation bilaterally, normal work of breathing GI: soft, nontender, nondistended, + BS MS: no deformity or atrophy Skin: warm and dry, no rash Neuro:  Alert and Oriented x 3, Strength and sensation are intact Psych: euthymic mood, full affect  Wt Readings from Last 3 Encounters:  08/22/15 149 lb 12.8 oz (67.949 kg)  03/02/15 159 lb (72.122 kg)  02/23/15 166 lb (75.297 kg)      Studies/Labs Reviewed:   EKG:  EKG is  ordered today and showed sinus bradycardia at 52bpm with no ST changes and septal infarct  Recent Labs: 12/02/2014: ALT 21 02/23/2015: BUN 23; Creat 1.29*; Potassium 4.7; Sodium 125*   Lipid Panel    Component Value Date/Time   CHOL 166 12/02/2014 0851   TRIG 151.0* 12/02/2014 0851   HDL 61.90 12/02/2014 0851   CHOLHDL 3 12/02/2014 0851   VLDL  30.2 12/02/2014 0851   LDLCALC 74 12/02/2014 0851    Additional studies/ records that were reviewed today include:  none    ASSESSMENT:    1. Atherosclerosis of native coronary artery of native heart without angina pectoris   2. Essential hypertension, benign  3. Carotid artery stenosis, asymptomatic, unspecified laterality   4. Carotid artery disease, unspecified laterality ([place])   5. Pure hypercholesterolemia   6. Bradycardia      PLAN:  In order of problems listed above:  1.  ASCAD with remote MI and PCI of the LAD.  She has no angina. 2.  HTN - BP borderline controlled.  This am at home her BP was 137/43mmHg.  I have asked her to check her BP daily for a week and call with the results.  Continue amlodipine/ACE I and BB. 3.  Carotid artery stenosis 50-69% bilateral - continue ASA/statin.  Repeat dopplers 01/2016 4.  Dyslipidemia - LDL goal < 70.  Check FLP and ALT.  Continue statin5.   5.  Drug induced Bradycardia - asymptomatic   Medication Adjustments/Labs and Tests Ordered: Current medicines are reviewed at length with the patient today.  Concerns regarding medicines are outlined above.  Medication changes, Labs and Tests ordered today are listed in the Patient Instructions below. There are no Patient Instructions on file for this visit.   Lurena Nida, MD  08/22/2015 11:01 AM    [facility] [place], [place], [place]  [address] Phone: [phone]; Fax: [phone]

## 2015-08-25 ENCOUNTER — Telehealth: Payer: Self-pay

## 2015-08-25 DIAGNOSIS — E7800 Pure hypercholesterolemia, unspecified: Secondary | ICD-10-CM

## 2015-08-25 MED ORDER — EZETIMIBE 10 MG PO TABS: 10.0000 mg | ORAL_TABLET | Freq: Every day | ORAL | Status: DC

## 2015-08-25 NOTE — Telephone Encounter (Signed)
-----   Message from Sueanne Margarita, MD sent at 08/22/2015 10:15 PM EDT ----- Please find out who patient's nephrologist is.  LDL not at goal.  Add zetia 10mg  daily and repeat FLP and ALT In 8 weeks

## 2015-08-25 NOTE — Telephone Encounter (Signed)
Informed patient of results and verbal understanding expressed.  Instructed patient to START ZETIA 10 mg daily. FLP and ALT scheduled June 30. Patient agrees with treatment plan.  Patient's nephrologist is Dr. Dimas Aguas. Labs forwarded to [phone].

## 2015-08-26 ENCOUNTER — Telehealth: Payer: Self-pay

## 2015-08-26 NOTE — Telephone Encounter (Signed)
Prior auth for Ezetimibe 10mg  sent to [facility].

## 2015-08-29 ENCOUNTER — Other Ambulatory Visit: Payer: Self-pay

## 2015-08-29 ENCOUNTER — Telehealth: Payer: Self-pay | Admitting: Cardiology

## 2015-08-29 MED ORDER — AMLODIPINE BESYLATE 2.5 MG PO TABS: 2.5000 mg | ORAL_TABLET | Freq: Every day | ORAL | Status: DC

## 2015-08-29 MED ORDER — ZETIA 10 MG PO TABS: 10.0000 mg | ORAL_TABLET | Freq: Every day | ORAL | Status: DC

## 2015-08-29 NOTE — Telephone Encounter (Signed)
Instructed patient to RESTART AMLODIPINE 2.5 mg daily. Patient understands to check BP daily for a week and call with results.

## 2015-08-29 NOTE — Telephone Encounter (Signed)
Ezetimibe denied by [facility]. She must try name brand Zetia 1st, which is covered.

## 2015-08-29 NOTE — Telephone Encounter (Signed)
Patient st Dr. Dimas Aguas stopped her amlodipine last fall and she has not taken any since.

## 2015-08-29 NOTE — Telephone Encounter (Signed)
Increase amlodipine to 5mg  daily and check BP daily for a week and call

## 2015-08-29 NOTE — Telephone Encounter (Signed)
Restart 2.5mg  daily

## 2015-08-29 NOTE — Telephone Encounter (Signed)
New Message  Pt c/o BP issue:  1. What are your last 5 BP readings?   133/83 on 4/18 130/77 on 4/19 137/83 on 4/20 129/83 on 4/21 145/79 on 4/22 146/88 on 4/23 150/85 on 4/25  2. Are you having any other symptoms (ex. Dizziness, headache, blurred vision, passed out)? No

## 2015-09-02 ENCOUNTER — Encounter: Payer: Self-pay | Admitting: Family

## 2015-09-06 ENCOUNTER — Telehealth: Payer: Self-pay | Admitting: Cardiology

## 2015-09-06 NOTE — Telephone Encounter (Signed)
Pt calling with BP readings: 4-26 130/75 4-27 135/61 4-28 134/75 4-29 130/78 4-30 131/77 5-1 124/77 and 5-2 138/82

## 2015-09-06 NOTE — Telephone Encounter (Signed)
Patient understands to continue current treatment plan as BP is controlled. She was grateful for call.

## 2015-09-06 NOTE — Telephone Encounter (Signed)
BP controlled - no change in meds

## 2015-09-06 NOTE — Telephone Encounter (Signed)
BP readings to Dr. Turner for review. 

## 2015-09-09 ENCOUNTER — Encounter: Payer: Self-pay | Admitting: Family

## 2015-09-09 ENCOUNTER — Ambulatory Visit (INDEPENDENT_AMBULATORY_CARE_PROVIDER_SITE_OTHER): Payer: Medicare Other | Admitting: Family

## 2015-09-09 ENCOUNTER — Ambulatory Visit (HOSPITAL_COMMUNITY)
Admission: RE | Admit: 2015-09-09 | Discharge: 2015-09-09 | Disposition: A | Discharge status: Home / Self Care | Payer: Medicare Other | Source: Ambulatory Visit | Attending: Family | Admitting: Family

## 2015-09-09 VITALS — BP 179/81 | HR 62 | Temp 97.4°F | Resp 18 | Ht 64.0 in | Wt 150.9 lb

## 2015-09-09 DIAGNOSIS — I2510 Atherosclerotic heart disease of native coronary artery without angina pectoris: Secondary | ICD-10-CM | POA: Insufficient documentation

## 2015-09-09 DIAGNOSIS — I6529 Occlusion and stenosis of unspecified carotid artery: Secondary | ICD-10-CM | POA: Diagnosis not present

## 2015-09-09 DIAGNOSIS — I739 Peripheral vascular disease, unspecified: Secondary | ICD-10-CM

## 2015-09-09 DIAGNOSIS — I6523 Occlusion and stenosis of bilateral carotid arteries: Secondary | ICD-10-CM | POA: Insufficient documentation

## 2015-09-09 DIAGNOSIS — E785 Hyperlipidemia, unspecified: Secondary | ICD-10-CM | POA: Insufficient documentation

## 2015-09-09 DIAGNOSIS — I779 Disorder of arteries and arterioles, unspecified: Secondary | ICD-10-CM | POA: Diagnosis not present

## 2015-09-09 NOTE — Progress Notes (Signed)
Chief Complaint: Follow up Extracranial Carotid Artery Stenosis   History of Present Illness  Wendy Walters is a 82 y.o. female patient of Dr. Bridgett Larsson who presents with chief complaint: narrowing in arteries. Previous carotid studies demonstrated: RICA 0000000 stenosis, LICA 0000000 stenosis. Patient has no history of TIA or stroke symptom. The patient has never had amaurosis fugax or monocular blindness. The patient has never had facial drooping or hemiplegia. The patient has never had receptive or expressive aphasia. The patient's risks factors for carotid disease include: HLD, CAD with prior MI, and prior smoker. She walks 1 mile twice daily on a treadmill. Pt denies claudication sx's with walking. She had OA in knees that states is mild.   Pt states she has a runny nose, denies dyspnea, attributes to high pollen count.   Past Medical History  Diagnosis Date  . Chronic kidney disease     stage 3  . Fibrillary glomerulonephritis     w nephrotic range protenuria, following w neurology, managed with an ACE-I and ARB  . Heart attack ([place]) 02/03/2000  . Hypothyroidism   . Hyperlipidemia   . Osteoporosis     s/p actonel for about 7-8 years  . Coronary artery disease     s/p PCI 2001 of lad in setting of AWMI-rotational atherectomy with BMS -Dr Radford Pax  . H/O Doppler ultrasound 01/2003    ,39% bilateral carotid stenosis.  . Carotid artery occlusion   . Anemia   . Bradycardia 08/22/2015    Social History Social History  Substance Use Topics  . Smoking status: Former Smoker    Quit date: 06/08/1970  . Smokeless tobacco: Never Used     Comment: quit in 1972  . Alcohol Use: No    Family History Family History  Problem Relation Age of Onset  . Hypertension Mother   . Cancer - Prostate Father   . Cancer Father     Prostate  . Diabetes Daughter   . Hypertension Daughter     Surgical History Past Surgical History  Procedure Laterality Date  . Cardiac catheterization   2005    patent LAD stent  . Angioplasty  2001  . Eye surgery Bilateral 2012    Cataract  . Tonsillectomy  1942    Allergies  Allergen Reactions  . Codeine Nausea And Vomiting and Nausea Only    Current Outpatient Prescriptions  Medication Sig Dispense Refill  . alendronate (FOSAMAX) 70 MG tablet Take 70 mg by mouth once a week. Take with a full glass of water on an empty stomach.    Marland Kitchen amLODipine (NORVASC) 2.5 MG tablet Take 1 tablet (2.5 mg total) by mouth daily. 90 tablet 2  . Ascorbic Acid (VITAMIN C) 1000 MG tablet Take 1,000 mg by mouth daily.    Marland Kitchen aspirin 81 MG tablet Take 81 mg by mouth daily.    Marland Kitchen atorvastatin (LIPITOR) 80 MG tablet TAKE 1 TABLET EVERY DAY 90 tablet 0  . B Complex Vitamins (VITAMIN B COMPLEX PO) Take by mouth daily.    . calcium carbonate (CALCIUM 600) 600 MG TABS tablet Take 600 mg by mouth daily with breakfast.    . cholecalciferol (VITAMIN D) 1000 UNITS tablet Take 1,000 Units by mouth 2 (two) times daily.     . Coenzyme Q10 (COQ10) 200 MG CAPS Take by mouth daily.    . ferrous fumarate (HEMOCYTE - 106 MG FE) 325 (106 FE) MG TABS tablet Take 1 tablet by mouth daily.    Marland Kitchen  levothyroxine (SYNTHROID, LEVOTHROID) 88 MCG tablet Take 88 mcg by mouth daily before breakfast.    . lisinopril (PRINIVIL,ZESTRIL) 40 MG tablet Take 40 mg by mouth 2 (two) times daily.     . metoprolol succinate (TOPROL-XL) 50 MG 24 hr tablet Take 25 mg by mouth daily.    . Misc Natural Products (OSTEO BI-FLEX JOINT SHIELD PO) Take 2 tablets by mouth daily.    . Multiple Vitamin (MULTIVITAMIN) tablet Take 1 tablet by mouth daily.    . niacin 500 MG tablet Take 500 mg by mouth 2 (two) times daily with a meal.    . Omega-3 Fatty Acids (FISH OIL) 1000 MG CAPS Take 2 capsules by mouth daily. 1200 two tablets daily    . sodium bicarbonate 650 MG tablet Take 650 mg by mouth 2 (two) times daily.    Marland Kitchen torsemide (DEMADEX) 20 MG tablet Take 20 mg by mouth every other day.    . cephALEXin (KEFLEX)  500 MG capsule Take 500 mg by mouth 2 (two) times daily. Reported on 09/09/2015    . Vitamin D, Ergocalciferol, (DRISDOL) 50000 UNITS CAPS capsule Take 50,000 Units by mouth every 7 (seven) days. Reported on 09/09/2015    . ZETIA 10 MG tablet Take 1 tablet (10 mg total) by mouth daily. (Patient not taking: Reported on 09/09/2015) 90 tablet 3   No current facility-administered medications for this visit.    Review of Systems : See HPI for pertinent positives and negatives.  Physical Examination  Filed Vitals:   09/09/15 1158 09/09/15 1203  BP: 141/80 179/81  Pulse: 62   Temp: 97.4 F (36.3 C)   TempSrc: Oral   Resp: 18   Height: 5\' 4"  (1.626 m)   Weight: 150 lb 14.4 oz (68.448 kg)   SpO2: 98%    Body mass index is 25.89 kg/(m^2).  General: A&O x 3, WD, female  Eyes: PERRLA  Pulmonary: Sym exp, respirations are non labored, good air movt, CTAB, no rhonchi, wheezing, or rales. + rhinorrhea.   Cardiac: RRR, Nl S1, S2, no detected murmur  Vascular: Vessel Right Left  Radial Palpable Palpable  Brachial Palpable Palpable  Carotid Palpable, without bruit Palpable, without bruit  Aorta Not palpable N/A  Popliteal Not palpable Not palpable  PT  Palpable Palpable  DP Palpable Faintly Palpable   Gastrointestinal: soft, NTND, -G/R, - HSM, - palpable masses, - CVAT B  Musculoskeletal: M/S 5/5 throughout , Extremities without ischemic changes , B 2+ edema  Neurologic: CN 2-12 intact , Pain and light touch intact in extremities , Motor exam as listed above  Psychiatric: Judgment intact, Mood & affect appropriate for pt's clinical situation  Dermatologic: See M/S exam for extremity exam, no rashes otherwise noted           Non-Invasive Vascular Imaging CAROTID DUPLEX 09/09/2015   <40% bilateral ICA stenosis Decreased bilateral stenoses compared to outside facility in September 22, [id number]   Assessment: Wendy Walters is a 82 y.o. female who has no  history of stroke or TIA. Today's carotid duplex indicates <40% bilateral ICA stenosis. Decreased bilateral stenoses compared to outside facility in January 27, 2015.  Pt takes a daily 81 mg ASA and a statin.  Plan: Follow-up in 1 year with Carotid Duplex scan.   I discussed in depth with the patient the nature of atherosclerosis, and emphasized the importance of maximal medical management including strict control of blood pressure, blood glucose, and lipid levels, obtaining regular exercise, and continued cessation  of smoking.  The patient is aware that without maximal medical management the underlying atherosclerotic disease process will progress, limiting the benefit of any interventions. The patient was given information about stroke prevention and what symptoms should prompt the patient to seek immediate medical care. Thank you for allowing [place] to participate in this patient's care.  Clemon Chambers, RN, MSN, FNP-C [facility] Office: [phone]  Clinic Physician: Bridgett Larsson  09/09/2015 12:08 PM

## 2015-09-09 NOTE — Patient Instructions (Signed)
Stroke Prevention Some medical conditions and behaviors are associated with an increased chance of having a stroke. You may prevent a stroke by making healthy choices and managing medical conditions. HOW CAN I REDUCE MY RISK OF HAVING A STROKE?   Stay physically active. Get at least 30 minutes of activity on most or all days.  Do not smoke. It may also be helpful to avoid exposure to secondhand smoke.  Limit alcohol use. Moderate alcohol use is considered to be:  No more than 2 drinks per day for men.  No more than 1 drink per day for nonpregnant women.  Eat healthy foods. This involves:  Eating 5 or more servings of fruits and vegetables a day.  Making dietary changes that address high blood pressure (hypertension), high cholesterol, diabetes, or obesity.  Manage your cholesterol levels.  Making food choices that are high in fiber and low in saturated fat, trans fat, and cholesterol may control cholesterol levels.  Take any prescribed medicines to control cholesterol as directed by your health care provider.  Manage your diabetes.  Controlling your carbohydrate and sugar intake is recommended to manage diabetes.  Take any prescribed medicines to control diabetes as directed by your health care provider.  Control your hypertension.  Making food choices that are low in salt (sodium), saturated fat, trans fat, and cholesterol is recommended to manage hypertension.  Ask your health care provider if you need treatment to lower your blood pressure. Take any prescribed medicines to control hypertension as directed by your health care provider.  If you are 18-39 years of age, have your blood pressure checked every 3-5 years. If you are 40 years of age or older, have your blood pressure checked every year.  Maintain a healthy weight.  Reducing calorie intake and making food choices that are low in sodium, saturated fat, trans fat, and cholesterol are recommended to manage  weight.  Stop drug abuse.  Avoid taking birth control pills.  Talk to your health care provider about the risks of taking birth control pills if you are over 35 years old, smoke, get migraines, or have ever had a blood clot.  Get evaluated for sleep disorders (sleep apnea).  Talk to your health care provider about getting a sleep evaluation if you snore a lot or have excessive sleepiness.  Take medicines only as directed by your health care provider.  For some people, aspirin or blood thinners (anticoagulants) are helpful in reducing the risk of forming abnormal blood clots that can lead to stroke. If you have the irregular heart rhythm of atrial fibrillation, you should be on a blood thinner unless there is a good reason you cannot take them.  Understand all your medicine instructions.  Make sure that other conditions (such as anemia or atherosclerosis) are addressed. SEEK IMMEDIATE MEDICAL CARE IF:   You have sudden weakness or numbness of the face, arm, or leg, especially on one side of the body.  Your face or eyelid droops to one side.  You have sudden confusion.  You have trouble speaking (aphasia) or understanding.  You have sudden trouble seeing in one or both eyes.  You have sudden trouble walking.  You have dizziness.  You have a loss of balance or coordination.  You have a sudden, severe headache with no known cause.  You have new chest pain or an irregular heartbeat. Any of these symptoms may represent a serious problem that is an emergency. Do not wait to see if the symptoms will   go away. Get medical help at once. Call your local emergency services (911 in [place] Do not drive yourself to the hospital.   This information is not intended to replace advice given to you by your health care provider. Make sure you discuss any questions you have with your health care provider.   Document Released: 05/31/2004 Document Revised: 05/14/2014 Document Reviewed:  10/24/2012 Elsevier Interactive Patient Education 2016 Elsevier Inc.  

## 2015-09-09 NOTE — Progress Notes (Signed)
Filed Vitals:   09/09/15 1158 09/09/15 1203  BP: 141/80 179/81  Pulse: 62   Temp: 97.4 F (36.3 C)   TempSrc: Oral   Resp: 18   Height: 5\' 4"  (1.626 m)   Weight: 150 lb 14.4 oz (68.448 kg)   SpO2: 98%

## 2015-09-12 DIAGNOSIS — N183 Chronic kidney disease, stage 3 (moderate): Secondary | ICD-10-CM | POA: Diagnosis not present

## 2015-09-18 ENCOUNTER — Other Ambulatory Visit: Payer: Self-pay | Admitting: Cardiology

## 2015-10-19 DIAGNOSIS — C44311 Basal cell carcinoma of skin of nose: Secondary | ICD-10-CM | POA: Diagnosis not present

## 2015-10-26 NOTE — Addendum Note (Signed)
Addended by: Mena Goes on: 10/26/2015 04:56 PM   Modules accepted: Orders

## 2015-11-03 DIAGNOSIS — N183 Chronic kidney disease, stage 3 (moderate): Secondary | ICD-10-CM | POA: Diagnosis not present

## 2015-11-03 DIAGNOSIS — R809 Proteinuria, unspecified: Secondary | ICD-10-CM | POA: Diagnosis not present

## 2015-11-03 DIAGNOSIS — I10 Essential (primary) hypertension: Secondary | ICD-10-CM | POA: Diagnosis not present

## 2015-11-03 DIAGNOSIS — D631 Anemia in chronic kidney disease: Secondary | ICD-10-CM | POA: Diagnosis not present

## 2015-11-04 ENCOUNTER — Other Ambulatory Visit (INDEPENDENT_AMBULATORY_CARE_PROVIDER_SITE_OTHER): Payer: Medicare Other | Admitting: *Deleted

## 2015-11-04 DIAGNOSIS — E7800 Pure hypercholesterolemia, unspecified: Secondary | ICD-10-CM | POA: Diagnosis not present

## 2015-11-04 LAB — LIPID PANEL
CHOL/HDL RATIO: 3.1 ratio (ref <=5.0)
CHOLESTEROL: 199 mg/dL (ref 125–200)
HDL: 64 mg/dL (ref >=46)
LDL Cholesterol: 103 mg/dL (ref <130)
TRIGLYCERIDES: 158 mg/dL — AB (ref <150)
VLDL: 32 mg/dL — ABNORMAL HIGH (ref <30)

## 2015-11-04 LAB — ALT: ALT: 20 U/L (ref 6–29)

## 2015-12-12 DIAGNOSIS — R809 Proteinuria, unspecified: Secondary | ICD-10-CM | POA: Diagnosis not present

## 2015-12-15 DIAGNOSIS — N390 Urinary tract infection, site not specified: Secondary | ICD-10-CM | POA: Diagnosis not present

## 2015-12-15 DIAGNOSIS — I10 Essential (primary) hypertension: Secondary | ICD-10-CM | POA: Diagnosis not present

## 2015-12-15 DIAGNOSIS — N183 Chronic kidney disease, stage 3 (moderate): Secondary | ICD-10-CM | POA: Diagnosis not present

## 2015-12-15 DIAGNOSIS — D631 Anemia in chronic kidney disease: Secondary | ICD-10-CM | POA: Diagnosis not present

## 2016-01-03 DIAGNOSIS — H1131 Conjunctival hemorrhage, right eye: Secondary | ICD-10-CM | POA: Diagnosis not present

## 2016-01-25 DIAGNOSIS — Z85828 Personal history of other malignant neoplasm of skin: Secondary | ICD-10-CM | POA: Diagnosis not present

## 2016-01-25 DIAGNOSIS — L570 Actinic keratosis: Secondary | ICD-10-CM | POA: Diagnosis not present

## 2016-01-25 DIAGNOSIS — Z08 Encounter for follow-up examination after completed treatment for malignant neoplasm: Secondary | ICD-10-CM | POA: Diagnosis not present

## 2016-01-26 ENCOUNTER — Encounter: Payer: Self-pay | Admitting: Cardiology

## 2016-01-26 DIAGNOSIS — Z1389 Encounter for screening for other disorder: Secondary | ICD-10-CM | POA: Diagnosis not present

## 2016-01-26 DIAGNOSIS — I2510 Atherosclerotic heart disease of native coronary artery without angina pectoris: Secondary | ICD-10-CM | POA: Diagnosis not present

## 2016-01-26 DIAGNOSIS — E039 Hypothyroidism, unspecified: Secondary | ICD-10-CM | POA: Diagnosis not present

## 2016-01-26 DIAGNOSIS — I10 Essential (primary) hypertension: Secondary | ICD-10-CM | POA: Diagnosis not present

## 2016-01-26 DIAGNOSIS — N059 Unspecified nephritic syndrome with unspecified morphologic changes: Secondary | ICD-10-CM | POA: Diagnosis not present

## 2016-01-26 DIAGNOSIS — Z23 Encounter for immunization: Secondary | ICD-10-CM | POA: Diagnosis not present

## 2016-01-26 DIAGNOSIS — Z0000 Encounter for general adult medical examination without abnormal findings: Secondary | ICD-10-CM | POA: Diagnosis not present

## 2016-01-26 DIAGNOSIS — M810 Age-related osteoporosis without current pathological fracture: Secondary | ICD-10-CM | POA: Diagnosis not present

## 2016-01-26 DIAGNOSIS — N183 Chronic kidney disease, stage 3 (moderate): Secondary | ICD-10-CM | POA: Diagnosis not present

## 2016-01-26 DIAGNOSIS — E559 Vitamin D deficiency, unspecified: Secondary | ICD-10-CM | POA: Diagnosis not present

## 2016-01-26 DIAGNOSIS — I252 Old myocardial infarction: Secondary | ICD-10-CM | POA: Diagnosis not present

## 2016-01-26 DIAGNOSIS — E7800 Pure hypercholesterolemia, unspecified: Secondary | ICD-10-CM | POA: Diagnosis not present

## 2016-02-09 ENCOUNTER — Telehealth: Payer: Self-pay

## 2016-02-09 DIAGNOSIS — E7800 Pure hypercholesterolemia, unspecified: Secondary | ICD-10-CM

## 2016-02-09 MED ORDER — EZETIMIBE 10 MG PO TABS: 10.0000 mg | ORAL_TABLET | Freq: Every day | ORAL | 11 refills | Status: DC

## 2016-02-09 NOTE — Telephone Encounter (Signed)
-----   Message from Sueanne Margarita, MD sent at 01/30/2016 10:13 PM EDT ----- LDL 97 - add Zetia 10mg  daily and repeat FLP and ALT in 8 weeks

## 2016-02-09 NOTE — Telephone Encounter (Signed)
Informed patient of results and verbal understanding expressed.  Instructed patient to START ZETIA 10 mg daily.  FLP and ALT scheduled December 8th. Patient agrees with treatment plan.

## 2016-02-20 ENCOUNTER — Telehealth: Payer: Self-pay | Admitting: Cardiology

## 2016-02-20 MED ORDER — EZETIMIBE 10 MG PO TABS: 10.0000 mg | ORAL_TABLET | Freq: Every day | ORAL | 3 refills | Status: DC

## 2016-02-20 NOTE — Telephone Encounter (Signed)
New Message  Pt c/o medication issue:  1. Name of Medication: ezetimide  2. How are you currently taking this medication (dosage and times per day)? 10 mg once daily  3. Are you having a reaction (difficulty breathing--STAT)? No  4. What is your medication issue? Pt voiced needing nurse to make sure her medication is sent to [facility] mail order.  Please f/u with pt

## 2016-02-20 NOTE — Telephone Encounter (Signed)
Called in to [facility] mail order and patient notified.

## 2016-03-01 DIAGNOSIS — M810 Age-related osteoporosis without current pathological fracture: Secondary | ICD-10-CM | POA: Diagnosis not present

## 2016-03-15 DIAGNOSIS — R809 Proteinuria, unspecified: Secondary | ICD-10-CM | POA: Diagnosis not present

## 2016-03-15 DIAGNOSIS — I10 Essential (primary) hypertension: Secondary | ICD-10-CM | POA: Diagnosis not present

## 2016-03-19 DIAGNOSIS — N390 Urinary tract infection, site not specified: Secondary | ICD-10-CM | POA: Diagnosis not present

## 2016-03-19 DIAGNOSIS — N183 Chronic kidney disease, stage 3 (moderate): Secondary | ICD-10-CM | POA: Diagnosis not present

## 2016-03-19 DIAGNOSIS — D631 Anemia in chronic kidney disease: Secondary | ICD-10-CM | POA: Diagnosis not present

## 2016-03-19 DIAGNOSIS — I10 Essential (primary) hypertension: Secondary | ICD-10-CM | POA: Diagnosis not present

## 2016-04-13 ENCOUNTER — Other Ambulatory Visit: Payer: Medicare Other | Admitting: *Deleted

## 2016-04-13 DIAGNOSIS — R809 Proteinuria, unspecified: Secondary | ICD-10-CM | POA: Diagnosis not present

## 2016-04-13 DIAGNOSIS — E7800 Pure hypercholesterolemia, unspecified: Secondary | ICD-10-CM

## 2016-04-13 DIAGNOSIS — I10 Essential (primary) hypertension: Secondary | ICD-10-CM | POA: Diagnosis not present

## 2016-04-13 LAB — HEPATIC FUNCTION PANEL
ALK PHOS: 70 U/L (ref 33–130)
ALT: 25 U/L (ref 6–29)
AST: 35 U/L (ref 10–35)
Albumin: 3.3 g/dL — ABNORMAL LOW (ref 3.6–5.1)
BILIRUBIN DIRECT: 0.1 mg/dL (ref <=0.2)
BILIRUBIN INDIRECT: 0.4 mg/dL (ref 0.2–1.2)
Total Bilirubin: 0.5 mg/dL (ref 0.2–1.2)
Total Protein: 5.5 g/dL — ABNORMAL LOW (ref 6.1–8.1)

## 2016-04-13 LAB — LIPID PANEL
CHOL/HDL RATIO: 2.4 ratio (ref <5.0)
CHOLESTEROL: 161 mg/dL (ref <200)
HDL: 68 mg/dL (ref >50)
LDL Cholesterol: 65 mg/dL (ref <100)
Triglycerides: 138 mg/dL (ref <150)
VLDL: 28 mg/dL (ref <30)

## 2016-04-16 DIAGNOSIS — I10 Essential (primary) hypertension: Secondary | ICD-10-CM | POA: Diagnosis not present

## 2016-04-16 DIAGNOSIS — N390 Urinary tract infection, site not specified: Secondary | ICD-10-CM | POA: Diagnosis not present

## 2016-04-16 DIAGNOSIS — N183 Chronic kidney disease, stage 3 (moderate): Secondary | ICD-10-CM | POA: Diagnosis not present

## 2016-04-16 DIAGNOSIS — D631 Anemia in chronic kidney disease: Secondary | ICD-10-CM | POA: Diagnosis not present

## 2016-05-27 DIAGNOSIS — R05 Cough: Secondary | ICD-10-CM | POA: Diagnosis not present

## 2016-05-27 DIAGNOSIS — J069 Acute upper respiratory infection, unspecified: Secondary | ICD-10-CM | POA: Diagnosis not present

## 2016-06-20 DIAGNOSIS — D3131 Benign neoplasm of right choroid: Secondary | ICD-10-CM | POA: Diagnosis not present

## 2016-06-20 DIAGNOSIS — H26493 Other secondary cataract, bilateral: Secondary | ICD-10-CM | POA: Diagnosis not present

## 2016-06-20 DIAGNOSIS — H35372 Puckering of macula, left eye: Secondary | ICD-10-CM | POA: Diagnosis not present

## 2016-08-06 ENCOUNTER — Encounter: Payer: Self-pay | Admitting: Cardiology

## 2016-08-07 DIAGNOSIS — N183 Chronic kidney disease, stage 3 (moderate): Secondary | ICD-10-CM | POA: Diagnosis not present

## 2016-08-07 DIAGNOSIS — N390 Urinary tract infection, site not specified: Secondary | ICD-10-CM | POA: Diagnosis not present

## 2016-08-07 DIAGNOSIS — I10 Essential (primary) hypertension: Secondary | ICD-10-CM | POA: Diagnosis not present

## 2016-08-07 DIAGNOSIS — R809 Proteinuria, unspecified: Secondary | ICD-10-CM | POA: Diagnosis not present

## 2016-08-07 DIAGNOSIS — D631 Anemia in chronic kidney disease: Secondary | ICD-10-CM | POA: Diagnosis not present

## 2016-08-21 ENCOUNTER — Ambulatory Visit (INDEPENDENT_AMBULATORY_CARE_PROVIDER_SITE_OTHER): Payer: Medicare Other | Admitting: Cardiology

## 2016-08-21 ENCOUNTER — Encounter (INDEPENDENT_AMBULATORY_CARE_PROVIDER_SITE_OTHER): Payer: Self-pay

## 2016-08-21 ENCOUNTER — Encounter: Payer: Self-pay | Admitting: Cardiology

## 2016-08-21 VITALS — BP 116/80 | HR 67 | Ht 64.0 in | Wt 143.1 lb

## 2016-08-21 DIAGNOSIS — I779 Disorder of arteries and arterioles, unspecified: Secondary | ICD-10-CM | POA: Diagnosis not present

## 2016-08-21 DIAGNOSIS — I10 Essential (primary) hypertension: Secondary | ICD-10-CM | POA: Diagnosis not present

## 2016-08-21 DIAGNOSIS — R600 Localized edema: Secondary | ICD-10-CM | POA: Diagnosis not present

## 2016-08-21 DIAGNOSIS — E7800 Pure hypercholesterolemia, unspecified: Secondary | ICD-10-CM

## 2016-08-21 DIAGNOSIS — I2510 Atherosclerotic heart disease of native coronary artery without angina pectoris: Secondary | ICD-10-CM

## 2016-08-21 DIAGNOSIS — I739 Peripheral vascular disease, unspecified: Secondary | ICD-10-CM

## 2016-08-21 HISTORY — DX: Localized edema: R60.0

## 2016-08-21 LAB — BASIC METABOLIC PANEL
BUN / CREAT RATIO: 28 (ref 12–28)
BUN: 49 mg/dL — AB (ref 8–27)
CO2: 24 mmol/L (ref 18–29)
CREATININE: 1.75 mg/dL — AB (ref 0.57–1.00)
Calcium: 10.5 mg/dL — ABNORMAL HIGH (ref 8.7–10.3)
Chloride: 101 mmol/L (ref 96–106)
GFR calc non Af Amer: 27 mL/min/{1.73_m2} — ABNORMAL LOW (ref >59)
GFR, EST AFRICAN AMERICAN: 31 mL/min/{1.73_m2} — AB (ref >59)
Glucose: 99 mg/dL (ref 65–99)
Potassium: 4.1 mmol/L (ref 3.5–5.2)
Sodium: 141 mmol/L (ref 134–144)

## 2016-08-21 NOTE — Progress Notes (Signed)
Cardiology Office Note    Date:  11/12/6281   ID:  Wendy Walters, DOB [birth year], MRN [id number]  PCP:  Gerrit Heck, MD  Cardiologist:  Fransico Him, MD   Chief Complaint  Patient presents with  . Follow-up  . Coronary Artery Disease  . Hypertension  . Hyperlipidemia    History of Present Illness:  Wendy Walters is a 83 y.o. female with a history of ASCAD s/p PCI of LAD 2001 in setting of AWMI and cath 2005 with patent stent to the LAD, dyslipidemia, carotid artery stenosis and HTN.  She returns today for followup and is doing well.  She denies any anginal chest pain, SOB, DOE, PND, orthopnea, dizziness, palpitations or syncope. She continues to walk for exercise 2 miles daily for an hour. She has chronic LE edema that recently became worse and her amlodipine was stopped and her Cardizem was increased and LE edema stopped.   She does not use table salt    Past Medical History:  Diagnosis Date  . Anemia   . Bradycardia 08/22/2015  . Carotid artery occlusion   . Chronic kidney disease    stage 3  . Coronary artery disease    s/p PCI 2001 of lad in setting of AWMI-rotational atherectomy with BMS -Dr Radford Pax  . Edema extremities 08/21/2016  . Fibrillary glomerulonephritis    w nephrotic range protenuria, following w neurology, managed with an ACE-I and ARB  . H/O Doppler ultrasound 01/2003   ,39% bilateral carotid stenosis.  Marland Kitchen Heart attack ([place]) 02/03/2000  . Hyperlipidemia   . Hypothyroidism   . Osteoporosis    s/p actonel for about 7-8 years    Past Surgical History:  Procedure Laterality Date  . ANGIOPLASTY  2001  . CARDIAC CATHETERIZATION  2005   patent LAD stent  . EYE SURGERY Bilateral 2012   Cataract  . TONSILLECTOMY  1942    Current Medications: Current Meds  Medication Sig  . alendronate (FOSAMAX) 70 MG tablet Take 70 mg by mouth once a week. Take with a full glass of water on an empty stomach.  . Ascorbic Acid (VITAMIN C) 1000 MG  tablet Take 1,000 mg by mouth daily.  Marland Kitchen aspirin 81 MG tablet Take 81 mg by mouth daily.  Marland Kitchen atorvastatin (LIPITOR) 80 MG tablet Take 1 tablet (80 mg total) by mouth daily.  . B Complex Vitamins (VITAMIN B COMPLEX PO) Take 1 tablet by mouth daily.   . calcium carbonate (CALCIUM 600) 600 MG TABS tablet Take 600 mg by mouth daily with breakfast.  . cephALEXin (KEFLEX) 500 MG capsule Take 500 mg by mouth 2 (two) times daily. Reported on 09/09/2015  . cholecalciferol (VITAMIN D) 1000 UNITS tablet Take 1,000 Units by mouth 2 (two) times daily.   . Coenzyme Q10 (COQ10) 200 MG CAPS Take 1 capsule by mouth daily.   Marland Kitchen diltiazem (CARDIZEM CD) 240 MG 24 hr capsule Take 240 mg by mouth daily.  Marland Kitchen ezetimibe (ZETIA) 10 MG tablet Take 1 tablet (10 mg total) by mouth daily.  . ferrous fumarate (HEMOCYTE - 106 MG FE) 325 (106 FE) MG TABS tablet Take 1 tablet by mouth daily.  Marland Kitchen levothyroxine (SYNTHROID, LEVOTHROID) 88 MCG tablet Take 88 mcg by mouth daily before breakfast.  . lisinopril (PRINIVIL,ZESTRIL) 40 MG tablet Take 40 mg by mouth 2 (two) times daily.   . Misc Natural Products (OSTEO BI-FLEX JOINT SHIELD PO) Take 2 tablets by mouth daily.  . Multiple Vitamin (MULTIVITAMIN) tablet Take 1  tablet by mouth daily.  . niacin 500 MG tablet Take 500 mg by mouth 2 (two) times daily with a meal.  . Omega-3 Fatty Acids (FISH OIL) 1000 MG CAPS Take 2 capsules by mouth daily. 1200 two tablets daily  . sodium bicarbonate 650 MG tablet Take 650 mg by mouth 2 (two) times daily.  Marland Kitchen torsemide (DEMADEX) 20 MG tablet Take 20 mg by mouth daily.     Allergies:   Codeine   Social History   Social History  . Marital status: Widowed    Spouse name: N/A  . Number of children: N/A  . Years of education: N/A   Social History Main Topics  . Smoking status: Former Smoker    Quit date: 06/08/1970  . Smokeless tobacco: Never Used     Comment: quit in 1972  . Alcohol use No  . Drug use: No  . Sexual activity: Not Asked   Other  Topics Concern  . None   Social History Narrative  . None     Family History:  The patient's family history includes Cancer in her father; Cancer - Prostate in her father; Diabetes in her daughter; Hypertension in her daughter and mother.   ROS:   Please see the history of present illness.    ROS All other systems reviewed and are negative.  No flowsheet data found.     PHYSICAL EXAM:   VS:  BP 116/80   Pulse 67   Ht 5\' 4"  (1.626 m)   Wt 143 lb 1.9 oz (64.9 kg)   BMI 24.57 kg/m    GEN: Well nourished, well developed, in no acute distress  HEENT: normal  Neck: no JVD, carotid bruits, or masses Cardiac: RRR; no murmurs, rubs, or gallops,no edema.  Intact distal pulses bilaterally.  Respiratory:  clear to auscultation bilaterally, normal work of breathing GI: soft, nontender, nondistended, + BS MS: no deformity or atrophy  Skin: warm and dry, no rash Neuro:  Alert and Oriented x 3, Strength and sensation are intact Psych: euthymic mood, full affect  Wt Readings from Last 3 Encounters:  08/21/16 143 lb 1.9 oz (64.9 kg)  09/09/15 150 lb 14.4 oz (68.4 kg)  08/22/15 149 lb 12.8 oz (67.9 kg)      Studies/Labs Reviewed:   EKG:  EKG is ordered today.  The ekg ordered today demonstrates NSR at 67bpm with nonspecific T wave abnormality  Recent Labs: 08/22/2015: BUN 40; Creat 1.72; Potassium 4.1; Sodium 142 04/13/2016: ALT 25   Lipid Panel    Component Value Date/Time   CHOL 161 04/13/2016 0935   TRIG 138 04/13/2016 0935   HDL 68 04/13/2016 0935   CHOLHDL 2.4 04/13/2016 0935   VLDL 28 04/13/2016 0935   LDLCALC 65 04/13/2016 0935    Additional studies/ records that were reviewed today include:  none    ASSESSMENT:    1. Atherosclerosis of native coronary artery of native heart without angina pectoris   2. Essential hypertension, benign   3. Bilateral carotid artery disease ([place])   4. Pure hypercholesterolemia   5. Edema extremities      PLAN:  In order of  problems listed above:  1. ASCAD -  s/p PCI of LAD 2001 in setting of AWMI and cath 2005 with patent stent to the LAD. She has not had any anginal symptoms.  She will continue on ASA, statin and BB.  2. HTN - Her BP is controlled.  She will continue on BB, CCB and  ACE I. 3. Bilateral carotid artery stenosis -< 40% - repeat dopplers 09/2016.  Continue ASA and statin.  4. Hyperlipidemia - her LDL goal is < 70 and she will continue on high dose statin, zetia and niacin.  LDL was 68 on labs 04/2016. 5. Chronic LE edema - stable on demadex.  Check BMET.    Medication Adjustments/Labs and Tests Ordered: Current medicines are reviewed at length with the patient today.  Concerns regarding medicines are outlined above.  Medication changes, Labs and Tests ordered today are listed in the Patient Instructions below.  There are no Patient Instructions on file for this visit.   Signed, Fransico Him, MD  08/21/2016 10:05 AM    [address], [place], [place]  [address] Phone: [phone]; Fax: [phone]

## 2016-08-21 NOTE — Patient Instructions (Addendum)
Medication Instructions:  Your physician recommends that you continue on your current medications as directed. Please refer to the Current Medication list given to you today.   Labwork: TODAY: BMET  Testing/Procedures: You have carotid dopplers scheduled 5/18.  Follow-Up: Your physician wants you to follow-up in: 1 year with Dr. Radford Pax. You will receive a reminder letter in the mail two months in advance. If you don't receive a letter, please call our office to schedule the follow-up appointment.   Any Other Special Instructions Will Be Listed Below (If Applicable).     If you need a refill on your cardiac medications before your next appointment, please call your pharmacy.

## 2016-08-27 ENCOUNTER — Telehealth: Payer: Self-pay | Admitting: Internal Medicine

## 2016-08-27 NOTE — Telephone Encounter (Signed)
Labs done on 4/17 were by Dr. Radford Pax- ok to mail to patient?

## 2016-08-27 NOTE — Telephone Encounter (Signed)
Informed patient labs will be mailed. She was grateful for call.

## 2016-08-27 NOTE — Telephone Encounter (Signed)
Yes you can

## 2016-08-27 NOTE — Telephone Encounter (Signed)
Follow Up:   Pt says she would like a copy of her lab results from 08-21-16 mailed to her please.

## 2016-09-10 DIAGNOSIS — I10 Essential (primary) hypertension: Secondary | ICD-10-CM | POA: Diagnosis not present

## 2016-09-10 DIAGNOSIS — D631 Anemia in chronic kidney disease: Secondary | ICD-10-CM | POA: Diagnosis not present

## 2016-09-10 DIAGNOSIS — N183 Chronic kidney disease, stage 3 (moderate): Secondary | ICD-10-CM | POA: Diagnosis not present

## 2016-09-12 DIAGNOSIS — I10 Essential (primary) hypertension: Secondary | ICD-10-CM | POA: Diagnosis not present

## 2016-09-12 DIAGNOSIS — N390 Urinary tract infection, site not specified: Secondary | ICD-10-CM | POA: Diagnosis not present

## 2016-09-12 DIAGNOSIS — N183 Chronic kidney disease, stage 3 (moderate): Secondary | ICD-10-CM | POA: Diagnosis not present

## 2016-09-12 DIAGNOSIS — D631 Anemia in chronic kidney disease: Secondary | ICD-10-CM | POA: Diagnosis not present

## 2016-09-18 ENCOUNTER — Other Ambulatory Visit: Payer: Self-pay | Admitting: Cardiology

## 2016-09-21 ENCOUNTER — Encounter (HOSPITAL_COMMUNITY): Payer: Medicare Other

## 2016-09-21 ENCOUNTER — Ambulatory Visit: Payer: Medicare Other | Admitting: Family

## 2016-10-15 ENCOUNTER — Ambulatory Visit (HOSPITAL_COMMUNITY): Payer: Medicare Other

## 2016-10-15 ENCOUNTER — Ambulatory Visit: Payer: Medicare Other | Admitting: Family

## 2016-11-05 ENCOUNTER — Encounter: Payer: Self-pay | Admitting: Family

## 2016-11-16 ENCOUNTER — Ambulatory Visit (INDEPENDENT_AMBULATORY_CARE_PROVIDER_SITE_OTHER): Payer: Medicare Other | Admitting: Family

## 2016-11-16 ENCOUNTER — Ambulatory Visit (HOSPITAL_COMMUNITY)
Admission: RE | Admit: 2016-11-16 | Discharge: 2016-11-16 | Disposition: A | Discharge status: Home / Self Care | Payer: Medicare Other | Source: Ambulatory Visit | Attending: Vascular Surgery | Admitting: Vascular Surgery

## 2016-11-16 ENCOUNTER — Encounter: Payer: Self-pay | Admitting: Family

## 2016-11-16 VITALS — BP 158/80 | HR 72 | Temp 97.1°F | Resp 20 | Ht 64.0 in | Wt 135.0 lb

## 2016-11-16 DIAGNOSIS — I6523 Occlusion and stenosis of bilateral carotid arteries: Secondary | ICD-10-CM

## 2016-11-16 NOTE — Progress Notes (Signed)
Chief Complaint: Follow up Extracranial Carotid Artery Stenosis   History of Present Illness  Chairty Walters is a 83 y.o. female patient of Dr. Bridgett Larsson who presents with chief complaint: narrowing in arteries. Previous carotid studies demonstrated: RICA 09-73% stenosis, LICA 53-29% stenosis.  She denies any known history of stroke or TIA. Specifically she denies a history of amaurosis fugax or monocular blindness, unilateral facial drooping, hemiplegia, or receptive or expressive aphasia.    The patient's risks factors for carotid disease include: HLD, CAD with prior MI, and prior smoker.  She walks 1.5 mile twice daily on a treadmill. Pt denies claudication sx's with walking. She had OA in knees that states is mild.   Pt states her blood pressure at home is 140's/70's  She was hospitalized in 2016 with dehydration and exacerbation of CKD.   Patient has not had previous carotid artery intervention.  Pt Diabetic: no Pt smoker: former smoker, quit in 1972, smoked x 20 years   Pt meds include: Statin : yes ASA: yes Other anticoagulants/antiplatelets: no   Past Medical History:  Diagnosis Date  . Anemia   . Bradycardia 08/22/2015  . Carotid artery occlusion   . Chronic kidney disease    stage 3  . Coronary artery disease    s/p PCI 2001 of lad in setting of AWMI-rotational atherectomy with BMS -Dr Radford Pax  . Edema extremities 08/21/2016  . Fibrillary glomerulonephritis    w nephrotic range protenuria, following w neurology, managed with an ACE-I and ARB  . H/O Doppler ultrasound 01/2003   ,39% bilateral carotid stenosis.  Marland Kitchen Heart attack ([facility]) 02/03/2000  . Hyperlipidemia   . Hypothyroidism   . Osteoporosis    s/p actonel for about 7-8 years    Social History Social History  Substance Use Topics  . Smoking status: Former Smoker    Quit date: 06/08/1970  . Smokeless tobacco: Never Used     Comment: quit in 1972  . Alcohol use No    Family History Family History   Problem Relation Age of Onset  . Hypertension Mother   . Cancer - Prostate Father   . Cancer Father        Prostate  . Diabetes Daughter   . Hypertension Daughter     Surgical History Past Surgical History:  Procedure Laterality Date  . ANGIOPLASTY  2001  . CARDIAC CATHETERIZATION  2005   patent LAD stent  . EYE SURGERY Bilateral 2012   Cataract  . TONSILLECTOMY  1942    Allergies  Allergen Reactions  . Codeine Nausea And Vomiting and Nausea Only    Current Outpatient Prescriptions  Medication Sig Dispense Refill  . alendronate (FOSAMAX) 70 MG tablet Take 70 mg by mouth once a week. Take with a full glass of water on an empty stomach.    . Ascorbic Acid (VITAMIN C) 1000 MG tablet Take 1,000 mg by mouth daily.    Marland Kitchen aspirin 81 MG tablet Take 81 mg by mouth daily.    Marland Kitchen atorvastatin (LIPITOR) 80 MG tablet Take 1 tablet (80 mg total) by mouth daily. 90 tablet 3  . B Complex Vitamins (VITAMIN B COMPLEX PO) Take 1 tablet by mouth daily.     . calcium carbonate (CALCIUM 600) 600 MG TABS tablet Take 600 mg by mouth daily with breakfast.    . Coenzyme Q10 (COQ10) 200 MG CAPS Take 1 capsule by mouth daily.     Marland Kitchen diltiazem (CARDIZEM CD) 240 MG 24 hr capsule Take  240 mg by mouth daily.    Marland Kitchen ezetimibe (ZETIA) 10 MG tablet Take 1 tablet (10 mg total) by mouth daily. 90 tablet 3  . ferrous fumarate (HEMOCYTE - 106 MG FE) 325 (106 FE) MG TABS tablet Take 1 tablet by mouth daily.    Marland Kitchen levothyroxine (SYNTHROID, LEVOTHROID) 88 MCG tablet Take 88 mcg by mouth daily before breakfast.    . lisinopril (PRINIVIL,ZESTRIL) 40 MG tablet Take 40 mg by mouth 2 (two) times daily.     . Multiple Vitamin (MULTIVITAMIN) tablet Take 1 tablet by mouth daily.    . niacin 500 MG tablet Take 500 mg by mouth 2 (two) times daily with a meal.    . sodium bicarbonate 650 MG tablet Take 650 mg by mouth 2 (two) times daily.    Marland Kitchen torsemide (DEMADEX) 20 MG tablet Take 20 mg by mouth daily.      No current  facility-administered medications for this visit.     Review of Systems : See HPI for pertinent positives and negatives.  Physical Examination  Vitals:   11/16/16 1324 11/16/16 1329  BP: (!) 150/80 (!) 158/80  Pulse: 72   Resp: 20   Temp: (!) 97.1 F (36.2 C)   TempSrc: Oral   SpO2: 93%   Weight: 135 lb (61.2 kg)   Height: 5\' 4"  (1.626 m)    Body mass index is 23.17 kg/m.  General: A&O x 3, WD, female  Eyes: PERRLA  Pulmonary: Sym exp, respirations are non labored, good air movt, CTAB, no rhonchi, wheezing, or rales.   Cardiac: RRR, Nl S1, S2, no detected murmur  Vascular: Vessel Right Left  Radial Palpable Palpable  Brachial Palpable Palpable  Carotid Palpable, without bruit Palpable, without bruit  Aorta Not palpable N/A  Popliteal Not palpable Not palpable  PT  Palpable Palpable  DP Palpable  Palpable   Gastrointestinal: soft, NTND, -G/R, - HSM, - palpable masses, - CVAT B  Musculoskeletal: M/S 5/5 throughout , Extremities without ischemic changes , B 2+ edema  Neurologic: CN 2-12 intact , Pain and light touch intact in extremities, Motor exam as listed above  Psychiatric: Judgment intact, Mood & affect appropriate for pt's clinical situation  Dermatologic: See M/S exam for extremity exam, no rashes otherwise noted      Assessment: Wendy Walters is a 83 y.o. female who has no history of stroke or TIA.  Fortunately she does not have DM and quit smoking in 1972. Her atherosclerotic risk factors include CAD, 20 year history of smoking (remote), and CKD.   Pt takes a daily 81 mg ASA and a statin.  DATA Carotid Duplex (11/16/16): <40% bilateral ICA stenosis. Bilateral vertebral artery flow is antegrade.  Bilateral subclavian artery waveforms are normal.  No significant change compared to the last exam on 09-09-15.    Plan: Follow-up in 18 months with Carotid Duplex scan.    I discussed in depth with the patient  the nature of atherosclerosis, and emphasized the importance of maximal medical management including strict control of blood pressure, blood glucose, and lipid levels, obtaining regular exercise, and continued cessation of smoking.  The patient is aware that without maximal medical management the underlying atherosclerotic disease process will progress, limiting the benefit of any interventions. The patient was given information about stroke prevention and what symptoms should prompt the patient to seek immediate medical care. Thank you for allowing [place] to participate in this patient's care.  Vinnie Level Sheryn Aldaz, RN, MSN, FNP-C [facility] Office: [phone]  Clinic Physician: Donzetta Matters on call  11/16/16 1:35 PM

## 2016-11-16 NOTE — Patient Instructions (Signed)
Stroke Prevention Some medical conditions and behaviors are associated with an increased chance of having a stroke. You may prevent a stroke by making healthy choices and managing medical conditions. How can I reduce my risk of having a stroke?  Stay physically active. Get at least 30 minutes of activity on most or all days.  Do not smoke. It may also be helpful to avoid exposure to secondhand smoke.  Limit alcohol use. Moderate alcohol use is considered to be: ? No more than 2 drinks per day for men. ? No more than 1 drink per day for nonpregnant women.  Eat healthy foods. This involves: ? Eating 5 or more servings of fruits and vegetables a day. ? Making dietary changes that address high blood pressure (hypertension), high cholesterol, diabetes, or obesity.  Manage your cholesterol levels. ? Making food choices that are high in fiber and low in saturated fat, trans fat, and cholesterol may control cholesterol levels. ? Take any prescribed medicines to control cholesterol as directed by your health care provider.  Manage your diabetes. ? Controlling your carbohydrate and sugar intake is recommended to manage diabetes. ? Take any prescribed medicines to control diabetes as directed by your health care provider.  Control your hypertension. ? Making food choices that are low in salt (sodium), saturated fat, trans fat, and cholesterol is recommended to manage hypertension. ? Ask your health care provider if you need treatment to lower your blood pressure. Take any prescribed medicines to control hypertension as directed by your health care provider. ? If you are 18-39 years of age, have your blood pressure checked every 3-5 years. If you are 40 years of age or older, have your blood pressure checked every year.  Maintain a healthy weight. ? Reducing calorie intake and making food choices that are low in sodium, saturated fat, trans fat, and cholesterol are recommended to manage  weight.  Stop drug abuse.  Avoid taking birth control pills. ? Talk to your health care provider about the risks of taking birth control pills if you are over 35 years old, smoke, get migraines, or have ever had a blood clot.  Get evaluated for sleep disorders (sleep apnea). ? Talk to your health care provider about getting a sleep evaluation if you snore a lot or have excessive sleepiness.  Take medicines only as directed by your health care provider. ? For some people, aspirin or blood thinners (anticoagulants) are helpful in reducing the risk of forming abnormal blood clots that can lead to stroke. If you have the irregular heart rhythm of atrial fibrillation, you should be on a blood thinner unless there is a good reason you cannot take them. ? Understand all your medicine instructions.  Make sure that other conditions (such as anemia or atherosclerosis) are addressed. Get help right away if:  You have sudden weakness or numbness of the face, arm, or leg, especially on one side of the body.  Your face or eyelid droops to one side.  You have sudden confusion.  You have trouble speaking (aphasia) or understanding.  You have sudden trouble seeing in one or both eyes.  You have sudden trouble walking.  You have dizziness.  You have a loss of balance or coordination.  You have a sudden, severe headache with no known cause.  You have new chest pain or an irregular heartbeat. Any of these symptoms may represent a serious problem that is an emergency. Do not wait to see if the symptoms will go away.   Get medical help at once. Call your local emergency services (911 in [place] Do not drive yourself to the hospital. This information is not intended to replace advice given to you by your health care provider. Make sure you discuss any questions you have with your health care provider. Document Released: 05/31/2004 Document Revised: 09/29/2015 Document Reviewed: 10/24/2012 Elsevier  Interactive Patient Education  2017 [employer].     Preventing Cerebrovascular Disease Arteries are blood vessels that carry blood that contains oxygen from the heart to all parts of the body. Cerebrovascular disease affects arteries that supply the brain. Any condition that blocks or disrupts blood flow to the brain can cause cerebrovascular disease. Brain cells that lose blood supply start to die within minutes (stroke). Stroke is the main danger of cerebrovascular disease. Atherosclerosis and high blood pressure are common causes of cerebrovascular disease. Atherosclerosis is narrowing and hardening of an artery that results when fat, cholesterol, calcium, or other substances (plaque) build up inside an artery. Plaque reduces blood flow through the artery. High blood pressure increases the risk of bleeding inside the brain. Making diet and lifestyle changes to prevent atherosclerosis and high blood pressure lowers your risk of cerebrovascular disease. What nutrition changes can be made?  Eat more fruits, vegetables, and whole grains.  Reduce how much saturated fat you eat. To do this, eat less red meat and fewer full-fat dairy products.  Eat healthy proteins instead of red meat. Healthy proteins include: ? Fish. Eat fish that contains heart-healthy omega-3 fatty acids, twice a week. Examples include salmon, albacore tuna, mackerel, and herring. ? Chicken. ? Nuts. ? Low-fat or nonfat yogurt.  Avoid processed meats, like bacon and lunchmeat.  Avoid foods that contain: ? A lot of sugar, such as sweets and drinks with added sugar. ? A lot of salt (sodium). Avoid adding extra salt to your food, as told by your health care provider. ? Trans fats, such as margarine and baked goods. Trans fats may be listed as "partially hydrogenated oils" on food labels.  Check food labels to see how much sodium, sugar, and trans fats are in foods.  Use vegetable oils that contain low amounts of  saturated fat, such as olive oil or canola oil. What lifestyle changes can be made?  Drink alcohol in moderation. This means no more than 1 drink a day for nonpregnant women and 2 drinks a day for men. One drink equals 12 oz of beer, 5 oz of wine, or 1 oz of hard liquor.  If you are overweight, ask your health care provider to recommend a weight-loss plan for you. Losing 5-10 lb (2.2-4.5 kg) can reduce your risk of diabetes, atherosclerosis, and high blood pressure.  Exercise for 30?60 minutes on most days, or as much as told by your health care provider. ? Do moderate-intensity exercise, such as brisk walking, bicycling, and water aerobics. Ask your health care provider which activities are safe for you.  Do not use any products that contain nicotine or tobacco, such as cigarettes and e-cigarettes. If you need help quitting, ask your health care provider. Why are these changes important? Making these changes lowers your risk of many diseases that can cause cerebrovascular disease and stroke. Stroke is a leading cause of death and disability. Making these changes also improves your overall health and quality of life. What can I do to lower my risk? The following factors make you more likely to develop cerebrovascular disease:  Being overweight.  Smoking.  Being physically inactive.    Eating a high-fat diet.  Having certain health conditions, such as: ? Diabetes. ? High blood pressure. ? Heart disease. ? Atherosclerosis. ? High cholesterol. ? Sickle cell disease.  Talk with your health care provider about your risk for cerebrovascular disease. Work with your health care provider to control diseases that you have that may contribute to cerebrovascular disease. Your health care provider may prescribe medicines to help prevent major causes of cerebrovascular disease. Where to find more information: Learn more about preventing cerebrovascular disease from:  National Heart, Lung, and  Blood Institute: www.nhlbi.nih.gov/health/health-topics/topics/stroke  Centers for Disease Control and Prevention: cdc.gov/stroke/about.htm  Summary  Cerebrovascular disease can lead to a stroke.  Atherosclerosis and high blood pressure are major causes of cerebrovascular disease.  Making diet and lifestyle changes can reduce your risk of cerebrovascular disease.  Work with your health care provider to get your risk factors under control to reduce your risk of cerebrovascular disease. This information is not intended to replace advice given to you by your health care provider. Make sure you discuss any questions you have with your health care provider. Document Released: 05/08/2015 Document Revised: 11/11/2015 Document Reviewed: 05/08/2015 Elsevier Interactive Patient Education  2018 Elsevier Inc.  

## 2016-11-19 NOTE — Addendum Note (Signed)
Addended by: Lianne Cure A on: 11/19/2016 02:57 PM   Modules accepted: Orders

## 2017-01-11 DIAGNOSIS — N183 Chronic kidney disease, stage 3 (moderate): Secondary | ICD-10-CM | POA: Diagnosis not present

## 2017-01-11 DIAGNOSIS — I10 Essential (primary) hypertension: Secondary | ICD-10-CM | POA: Diagnosis not present

## 2017-01-14 DIAGNOSIS — I10 Essential (primary) hypertension: Secondary | ICD-10-CM | POA: Diagnosis not present

## 2017-01-14 DIAGNOSIS — D631 Anemia in chronic kidney disease: Secondary | ICD-10-CM | POA: Diagnosis not present

## 2017-01-14 DIAGNOSIS — N390 Urinary tract infection, site not specified: Secondary | ICD-10-CM | POA: Diagnosis not present

## 2017-01-14 DIAGNOSIS — N183 Chronic kidney disease, stage 3 (moderate): Secondary | ICD-10-CM | POA: Diagnosis not present

## 2017-01-29 DIAGNOSIS — Z85828 Personal history of other malignant neoplasm of skin: Secondary | ICD-10-CM | POA: Diagnosis not present

## 2017-01-29 DIAGNOSIS — Z08 Encounter for follow-up examination after completed treatment for malignant neoplasm: Secondary | ICD-10-CM | POA: Diagnosis not present

## 2017-01-29 DIAGNOSIS — D485 Neoplasm of uncertain behavior of skin: Secondary | ICD-10-CM | POA: Diagnosis not present

## 2017-01-29 DIAGNOSIS — C44311 Basal cell carcinoma of skin of nose: Secondary | ICD-10-CM | POA: Diagnosis not present

## 2017-02-01 DIAGNOSIS — I2510 Atherosclerotic heart disease of native coronary artery without angina pectoris: Secondary | ICD-10-CM | POA: Diagnosis not present

## 2017-02-01 DIAGNOSIS — Z1389 Encounter for screening for other disorder: Secondary | ICD-10-CM | POA: Diagnosis not present

## 2017-02-01 DIAGNOSIS — I6523 Occlusion and stenosis of bilateral carotid arteries: Secondary | ICD-10-CM | POA: Diagnosis not present

## 2017-02-01 DIAGNOSIS — Z23 Encounter for immunization: Secondary | ICD-10-CM | POA: Diagnosis not present

## 2017-02-01 DIAGNOSIS — I252 Old myocardial infarction: Secondary | ICD-10-CM | POA: Diagnosis not present

## 2017-02-01 DIAGNOSIS — N059 Unspecified nephritic syndrome with unspecified morphologic changes: Secondary | ICD-10-CM | POA: Diagnosis not present

## 2017-02-01 DIAGNOSIS — N183 Chronic kidney disease, stage 3 (moderate): Secondary | ICD-10-CM | POA: Diagnosis not present

## 2017-02-01 DIAGNOSIS — I10 Essential (primary) hypertension: Secondary | ICD-10-CM | POA: Diagnosis not present

## 2017-02-01 DIAGNOSIS — Z0000 Encounter for general adult medical examination without abnormal findings: Secondary | ICD-10-CM | POA: Diagnosis not present

## 2017-02-01 DIAGNOSIS — M810 Age-related osteoporosis without current pathological fracture: Secondary | ICD-10-CM | POA: Diagnosis not present

## 2017-02-01 DIAGNOSIS — E039 Hypothyroidism, unspecified: Secondary | ICD-10-CM | POA: Diagnosis not present

## 2017-02-01 DIAGNOSIS — E7800 Pure hypercholesterolemia, unspecified: Secondary | ICD-10-CM | POA: Diagnosis not present

## 2017-02-04 ENCOUNTER — Emergency Department (HOSPITAL_COMMUNITY): Payer: Medicare Other

## 2017-02-04 ENCOUNTER — Inpatient Hospital Stay (HOSPITAL_COMMUNITY)
Admission: EM | Admit: 2017-02-04 | Discharge: 2017-02-08 | DRG: 418 | Disposition: A | Discharge status: Home / Self Care | Payer: Medicare Other | Attending: Internal Medicine | Admitting: Internal Medicine

## 2017-02-04 ENCOUNTER — Encounter (HOSPITAL_COMMUNITY): Payer: Self-pay | Admitting: Emergency Medicine

## 2017-02-04 DIAGNOSIS — I129 Hypertensive chronic kidney disease with stage 1 through stage 4 chronic kidney disease, or unspecified chronic kidney disease: Secondary | ICD-10-CM | POA: Diagnosis present

## 2017-02-04 DIAGNOSIS — Z66 Do not resuscitate: Secondary | ICD-10-CM | POA: Diagnosis present

## 2017-02-04 DIAGNOSIS — R600 Localized edema: Secondary | ICD-10-CM | POA: Diagnosis present

## 2017-02-04 DIAGNOSIS — Z955 Presence of coronary angioplasty implant and graft: Secondary | ICD-10-CM

## 2017-02-04 DIAGNOSIS — I2510 Atherosclerotic heart disease of native coronary artery without angina pectoris: Secondary | ICD-10-CM | POA: Diagnosis present

## 2017-02-04 DIAGNOSIS — K8065 Calculus of gallbladder and bile duct with chronic cholecystitis with obstruction: Secondary | ICD-10-CM | POA: Diagnosis present

## 2017-02-04 DIAGNOSIS — Z7982 Long term (current) use of aspirin: Secondary | ICD-10-CM

## 2017-02-04 DIAGNOSIS — Z79899 Other long term (current) drug therapy: Secondary | ICD-10-CM

## 2017-02-04 DIAGNOSIS — D631 Anemia in chronic kidney disease: Secondary | ICD-10-CM | POA: Diagnosis present

## 2017-02-04 DIAGNOSIS — Z8249 Family history of ischemic heart disease and other diseases of the circulatory system: Secondary | ICD-10-CM | POA: Diagnosis not present

## 2017-02-04 DIAGNOSIS — E785 Hyperlipidemia, unspecified: Secondary | ICD-10-CM | POA: Diagnosis present

## 2017-02-04 DIAGNOSIS — K8020 Calculus of gallbladder without cholecystitis without obstruction: Secondary | ICD-10-CM | POA: Diagnosis not present

## 2017-02-04 DIAGNOSIS — D638 Anemia in other chronic diseases classified elsewhere: Secondary | ICD-10-CM

## 2017-02-04 DIAGNOSIS — E039 Hypothyroidism, unspecified: Secondary | ICD-10-CM | POA: Diagnosis present

## 2017-02-04 DIAGNOSIS — Z885 Allergy status to narcotic agent status: Secondary | ICD-10-CM | POA: Diagnosis not present

## 2017-02-04 DIAGNOSIS — N183 Chronic kidney disease, stage 3 unspecified: Secondary | ICD-10-CM | POA: Diagnosis present

## 2017-02-04 DIAGNOSIS — R933 Abnormal findings on diagnostic imaging of other parts of digestive tract: Secondary | ICD-10-CM | POA: Diagnosis not present

## 2017-02-04 DIAGNOSIS — Z87891 Personal history of nicotine dependence: Secondary | ICD-10-CM | POA: Diagnosis not present

## 2017-02-04 DIAGNOSIS — N059 Unspecified nephritic syndrome with unspecified morphologic changes: Secondary | ICD-10-CM | POA: Diagnosis present

## 2017-02-04 DIAGNOSIS — K760 Fatty (change of) liver, not elsewhere classified: Secondary | ICD-10-CM | POA: Diagnosis not present

## 2017-02-04 DIAGNOSIS — Z419 Encounter for procedure for purposes other than remedying health state, unspecified: Secondary | ICD-10-CM | POA: Diagnosis not present

## 2017-02-04 DIAGNOSIS — Z833 Family history of diabetes mellitus: Secondary | ICD-10-CM

## 2017-02-04 DIAGNOSIS — M810 Age-related osteoporosis without current pathological fracture: Secondary | ICD-10-CM | POA: Diagnosis present

## 2017-02-04 DIAGNOSIS — R0602 Shortness of breath: Secondary | ICD-10-CM | POA: Diagnosis not present

## 2017-02-04 DIAGNOSIS — K838 Other specified diseases of biliary tract: Secondary | ICD-10-CM | POA: Diagnosis not present

## 2017-02-04 DIAGNOSIS — K851 Biliary acute pancreatitis without necrosis or infection: Secondary | ICD-10-CM | POA: Diagnosis not present

## 2017-02-04 DIAGNOSIS — K8010 Calculus of gallbladder with chronic cholecystitis without obstruction: Secondary | ICD-10-CM | POA: Diagnosis not present

## 2017-02-04 DIAGNOSIS — I252 Old myocardial infarction: Secondary | ICD-10-CM | POA: Diagnosis not present

## 2017-02-04 DIAGNOSIS — R109 Unspecified abdominal pain: Secondary | ICD-10-CM | POA: Diagnosis not present

## 2017-02-04 DIAGNOSIS — I10 Essential (primary) hypertension: Secondary | ICD-10-CM | POA: Diagnosis not present

## 2017-02-04 DIAGNOSIS — R079 Chest pain, unspecified: Secondary | ICD-10-CM | POA: Diagnosis not present

## 2017-02-04 HISTORY — DX: Anemia in other chronic diseases classified elsewhere: D63.8

## 2017-02-04 HISTORY — DX: Essential (primary) hypertension: I10

## 2017-02-04 LAB — DIFFERENTIAL
Basophils Absolute: 0 10*3/uL (ref 0.0–0.1)
Basophils Relative: 0 %
Eosinophils Absolute: 0 10*3/uL (ref 0.0–0.7)
Eosinophils Relative: 1 %
LYMPHS PCT: 4 %
Lymphs Abs: 0.3 10*3/uL — ABNORMAL LOW (ref 0.7–4.0)
MONO ABS: 0.8 10*3/uL (ref 0.1–1.0)
Monocytes Relative: 10 %
NEUTROS ABS: 6.6 10*3/uL (ref 1.7–7.7)
Neutrophils Relative %: 85 %

## 2017-02-04 LAB — URINALYSIS, ROUTINE W REFLEX MICROSCOPIC
Bacteria, UA: NONE SEEN
Bilirubin Urine: NEGATIVE
GLUCOSE, UA: NEGATIVE mg/dL
Hgb urine dipstick: NEGATIVE
Ketones, ur: NEGATIVE mg/dL
Leukocytes, UA: NEGATIVE
Nitrite: NEGATIVE
PROTEIN: 100 mg/dL — AB
SPECIFIC GRAVITY, URINE: 1.008 (ref 1.005–1.030)
pH: 6 (ref 5.0–8.0)

## 2017-02-04 LAB — CBC
HEMATOCRIT: 27 % — AB (ref 36.0–46.0)
HEMOGLOBIN: 9.1 g/dL — AB (ref 12.0–15.0)
MCH: 30.1 pg (ref 26.0–34.0)
MCHC: 33.7 g/dL (ref 30.0–36.0)
MCV: 89.4 fL (ref 78.0–100.0)
Platelets: 226 10*3/uL (ref 150–400)
RBC: 3.02 MIL/uL — ABNORMAL LOW (ref 3.87–5.11)
RDW: 13.2 % (ref 11.5–15.5)
WBC: 7.8 10*3/uL (ref 4.0–10.5)

## 2017-02-04 LAB — COMPREHENSIVE METABOLIC PANEL
ALT: 429 U/L — ABNORMAL HIGH (ref 14–54)
ANION GAP: 9 (ref 5–15)
AST: 627 U/L — ABNORMAL HIGH (ref 15–41)
Albumin: 3.1 g/dL — ABNORMAL LOW (ref 3.5–5.0)
Alkaline Phosphatase: 155 U/L — ABNORMAL HIGH (ref 38–126)
BILIRUBIN TOTAL: 0.9 mg/dL (ref 0.3–1.2)
BUN: 28 mg/dL — ABNORMAL HIGH (ref 6–20)
CO2: 24 mmol/L (ref 22–32)
Calcium: 9.5 mg/dL (ref 8.9–10.3)
Chloride: 102 mmol/L (ref 101–111)
Creatinine, Ser: 1.48 mg/dL — ABNORMAL HIGH (ref 0.44–1.00)
GFR, EST AFRICAN AMERICAN: 37 mL/min — AB (ref >60)
GFR, EST NON AFRICAN AMERICAN: 32 mL/min — AB (ref >60)
Glucose, Bld: 108 mg/dL — ABNORMAL HIGH (ref 65–99)
POTASSIUM: 3.9 mmol/L (ref 3.5–5.1)
Sodium: 135 mmol/L (ref 135–145)
TOTAL PROTEIN: 6 g/dL — AB (ref 6.5–8.1)

## 2017-02-04 LAB — I-STAT TROPONIN, ED: TROPONIN I, POC: 0 ng/mL (ref 0.00–0.08)

## 2017-02-04 LAB — LIPASE, BLOOD: Lipase: 1469 U/L — ABNORMAL HIGH (ref 11–51)

## 2017-02-04 MED ORDER — EZETIMIBE 10 MG PO TABS
10.0000 mg | ORAL_TABLET | Freq: Every day | ORAL | Status: DC
  Filled 2017-02-04: qty 1

## 2017-02-04 MED ORDER — ONDANSETRON HCL 4 MG/2ML IJ SOLN: 4.0000 mg | Freq: Four times a day (QID) | INTRAMUSCULAR | Status: DC | PRN

## 2017-02-04 MED ORDER — PANTOPRAZOLE SODIUM 40 MG PO TBEC
40.0000 mg | DELAYED_RELEASE_TABLET | Freq: Once | ORAL | Status: AC
  Administered 2017-02-04: 40 mg via ORAL
  Filled 2017-02-04: qty 1

## 2017-02-04 MED ORDER — GI COCKTAIL ~~LOC~~
30.0000 mL | Freq: Once | ORAL | Status: AC
  Administered 2017-02-04: 30 mL via ORAL
  Filled 2017-02-04: qty 30

## 2017-02-04 MED ORDER — LISINOPRIL 20 MG PO TABS
40.0000 mg | ORAL_TABLET | Freq: Two times a day (BID) | ORAL | Status: DC
  Filled 2017-02-04: qty 2

## 2017-02-04 MED ORDER — ATORVASTATIN CALCIUM 40 MG PO TABS: 80.0000 mg | ORAL_TABLET | Freq: Every day | ORAL | Status: DC

## 2017-02-04 MED ORDER — MORPHINE SULFATE (PF) 4 MG/ML IV SOLN: 2.0000 mg | INTRAVENOUS | Status: DC | PRN

## 2017-02-04 MED ORDER — ONDANSETRON HCL 4 MG PO TABS: 4.0000 mg | ORAL_TABLET | Freq: Four times a day (QID) | ORAL | Status: DC | PRN

## 2017-02-04 MED ORDER — MORPHINE SULFATE (PF) 4 MG/ML IV SOLN
4.0000 mg | Freq: Once | INTRAVENOUS | Status: AC
  Administered 2017-02-04: 4 mg via INTRAVENOUS
  Filled 2017-02-04: qty 1

## 2017-02-04 MED ORDER — FERROUS FUMARATE 324 (106 FE) MG PO TABS
106.0000 mg | ORAL_TABLET | Freq: Every day | ORAL | Status: DC
  Administered 2017-02-05 – 2017-02-08 (×3): 106 mg via ORAL
  Filled 2017-02-04 (×4): qty 1

## 2017-02-04 MED ORDER — ACETAMINOPHEN 325 MG PO TABS
650.0000 mg | ORAL_TABLET | Freq: Four times a day (QID) | ORAL | Status: DC | PRN
  Administered 2017-02-05 – 2017-02-07 (×4): 650 mg via ORAL
  Filled 2017-02-04 (×4): qty 2

## 2017-02-04 MED ORDER — LEVOTHYROXINE SODIUM 88 MCG PO TABS
88.0000 ug | ORAL_TABLET | Freq: Every day | ORAL | Status: DC
  Administered 2017-02-05 – 2017-02-08 (×3): 88 ug via ORAL
  Filled 2017-02-04 (×3): qty 1

## 2017-02-04 MED ORDER — SODIUM BICARBONATE 650 MG PO TABS
650.0000 mg | ORAL_TABLET | Freq: Two times a day (BID) | ORAL | Status: DC
  Administered 2017-02-04 – 2017-02-08 (×7): 650 mg via ORAL
  Filled 2017-02-04 (×7): qty 1

## 2017-02-04 MED ORDER — DILTIAZEM HCL ER COATED BEADS 240 MG PO CP24
240.0000 mg | ORAL_CAPSULE | Freq: Every day | ORAL | Status: DC
Start: 2017-02-05 — End: 2017-02-08
  Administered 2017-02-05 – 2017-02-08 (×3): 240 mg via ORAL
  Filled 2017-02-04 (×3): qty 1

## 2017-02-04 MED ORDER — ACETAMINOPHEN 650 MG RE SUPP: 650.0000 mg | Freq: Four times a day (QID) | RECTAL | Status: DC | PRN

## 2017-02-04 MED ORDER — ASPIRIN 81 MG PO CHEW: 81.0000 mg | CHEWABLE_TABLET | Freq: Every day | ORAL | Status: DC

## 2017-02-04 MED ORDER — NIACIN 500 MG PO TABS
500.0000 mg | ORAL_TABLET | Freq: Two times a day (BID) | ORAL | Status: DC
  Filled 2017-02-04: qty 1

## 2017-02-04 MED ORDER — SODIUM CHLORIDE 0.9 % IV BOLUS (SEPSIS)
500.0000 mL | Freq: Once | INTRAVENOUS | Status: AC
  Administered 2017-02-04: 500 mL via INTRAVENOUS

## 2017-02-04 MED ORDER — LACTATED RINGERS IV SOLN
INTRAVENOUS | Status: DC
  Administered 2017-02-04 – 2017-02-06 (×4): via INTRAVENOUS

## 2017-02-04 MED ORDER — ONDANSETRON HCL 4 MG/2ML IJ SOLN
4.0000 mg | Freq: Once | INTRAMUSCULAR | Status: AC
  Administered 2017-02-04: 4 mg via INTRAVENOUS
  Filled 2017-02-04: qty 2

## 2017-02-04 MED ORDER — LISINOPRIL 20 MG PO TABS
40.0000 mg | ORAL_TABLET | Freq: Every day | ORAL | Status: DC
  Administered 2017-02-05 – 2017-02-08 (×3): 40 mg via ORAL
  Filled 2017-02-04 (×3): qty 2

## 2017-02-04 NOTE — H&P (Signed)
History and Physical    Wendy Walters PNT:[id number] DOB: Dec 10, 1932 DOA: 02/04/2017  PCP: Leighton Ruff, MD Consultants:  Radford Pax - cardiology; [facility] - nephrology; Elba Barman - vascular Patient coming from:  Home - lives alone but in a duplex next door to her daughter; Donald Prose: daughter, [phone]  Chief Complaint: abdominal pain  HPI: Wendy Walters is a 83 y.o. female with medical history significant of hypothyroidism; HTN; HLD; fibrillary glomerulonephritis; CAD; stage 3 CKD; and anemia presenting with abdominal pain.  She "was having stomach pain and it was getting really bad".  Abdominal pain started about 1230 last night.  +N/V x 2 overnight.  Last BM was this afternoon, had multiple regular BMs yesterday.  No fever.  No sick contacts.  Still has her gallbladder.     ED Course:  Lipase 1469, LFTs markedly elevated, negative troponin/EKG/CXR.  Hgb stable.  IVF, Zofran, morphine given.  US shows cholelithiasis without obvious cholecystitis.  Mildly prominent CBD.  Dr. Carlean Purl to see in AM.  Review of Systems: As per HPI; otherwise review of systems reviewed and negative.   Ambulatory Status:  Ambulates without assistance  Past Medical History:  Diagnosis Date  . Anemia   . Bradycardia 08/22/2015  . Carotid artery occlusion   . Chronic kidney disease    stage 3  . Coronary artery disease    s/p PCI 2001 of lad in setting of AWMI-rotational atherectomy with BMS -Dr Radford Pax  . Edema extremities 08/21/2016  . Fibrillary glomerulonephritis    w nephrotic range protenuria, following w neurology, managed with an ACE-I and ARB  . H/O Doppler ultrasound 01/2003   ,39% bilateral carotid stenosis.  . Hyperlipidemia   . Hypertension   . Hypothyroidism   . Osteoporosis    s/p actonel for about 7-8 years    Past Surgical History:  Procedure Laterality Date  . ANGIOPLASTY  2001  . CARDIAC CATHETERIZATION  2005   patent LAD stent  . EYE SURGERY Bilateral 2012   Cataract  .  TONSILLECTOMY  1942    Social History   Social History  . Marital status: Widowed    Spouse name: N/A  . Number of children: N/A  . Years of education: N/A   Occupational History  . retired    Social History Main Topics  . Smoking status: Former Smoker    Quit date: 06/08/1970  . Smokeless tobacco: Never Used     Comment: quit in 1972  . Alcohol use No     Comment: quit in her 40s  . Drug use: No  . Sexual activity: Not on file   Other Topics Concern  . Not on file   Social History Narrative  . No narrative on file    Allergies  Allergen Reactions  . Codeine Nausea And Vomiting and Nausea Only    Family History  Problem Relation Age of Onset  . Hypertension Mother   . Cancer - Prostate Father   . Cancer Father        Prostate  . Diabetes Daughter   . Hypertension Daughter     Prior to Admission medications   Medication Sig Start Date End Date Taking? Authorizing Provider  Ascorbic Acid (VITAMIN C) 1000 MG tablet Take 1,000 mg by mouth daily.   Yes [provider]  aspirin 81 MG tablet Take 81 mg by mouth daily.   Yes [provider]  atorvastatin (LIPITOR) 80 MG tablet Take 1 tablet (80 mg total) by mouth daily. 09/18/16  Yes Sueanne Margarita, MD  B Complex Vitamins (VITAMIN B COMPLEX PO) Take 1 tablet by mouth daily.    Yes [provider]  Calcium Carbonate-Vit D-Min (CALCIUM 1200 PO) Take 1 tablet by mouth daily.   Yes [provider]  Coenzyme Q10 (COQ10) 200 MG CAPS Take 1 capsule by mouth daily.    Yes [provider]  diltiazem (CARDIZEM CD) 240 MG 24 hr capsule Take 240 mg by mouth daily. 08/08/16  Yes [provider]  ezetimibe (ZETIA) 10 MG tablet Take 1 tablet (10 mg total) by mouth daily. 02/20/16  Yes Turner, Eber Hong, MD  ferrous fumarate (HEMOCYTE - 106 MG FE) 325 (106 FE) MG TABS tablet Take 1 tablet by mouth daily.   Yes [provider]  levothyroxine (SYNTHROID, LEVOTHROID) 88 MCG tablet  Take 88 mcg by mouth daily before breakfast.   Yes [provider]  lisinopril (PRINIVIL,ZESTRIL) 40 MG tablet Take 40 mg by mouth 2 (two) times daily.  12/28/14  Yes [provider]  Multiple Vitamin (MULTIVITAMIN) tablet Take 1 tablet by mouth daily.   Yes [provider]  niacin 500 MG tablet Take 500 mg by mouth 2 (two) times daily with a meal.   Yes [provider]  Omega-3 Fatty Acids (OMEGA 3 PO) Take 1 capsule by mouth 2 (two) times daily.   Yes [provider]  Probiotic Product (PROBIOTIC PO) Take 1 capsule by mouth every 3 (three) days.   Yes [provider]  sodium bicarbonate 650 MG tablet Take 650 mg by mouth 2 (two) times daily.   Yes [provider]  torsemide (DEMADEX) 20 MG tablet Take 20 mg by mouth daily.    Yes [provider]  zinc gluconate 50 MG tablet Take 50 mg by mouth daily.   Yes [provider]  alendronate (FOSAMAX) 70 MG tablet Take 70 mg by mouth once a week. Take with a full glass of water on an empty stomach.    [provider]    Physical Exam: Vitals:   02/04/17 2030 02/04/17 2100 02/04/17 2258 02/04/17 2259  BP: 140/70 140/74  (!) 145/74  Pulse: 77 79  79  Resp: 15 19  20   Temp:    98.6 F (37 C)  TempSrc:    Oral  SpO2: (!) 89% 92%  90%  Weight:      Height:   5\' 4"  (1.626 m)      General: Appears calm and comfortable and is NAD Eyes:  PERRL, EOMI, normal lids, iris ENT:  grossly normal hearing, lips & tongue, mmm; poor dentition Neck:  no LAD, masses or thyromegaly; no carotid bruits Cardiovascular:  RRR, 3/6 systolic murmur (chronic), no r/g.  Respiratory:   CTA bilaterally with no wheezes/rales/rhonchi.  Normal respiratory effort. Abdomen:  soft, tender in midepigastric region and otherwise NT, ND, NABS Skin:  no rash or induration seen on limited exam Musculoskeletal:  grossly normal tone BUE/BLE, good ROM, no bony abnormality Lower extremity: Chronic  1+ LE edema that is painful to touch (now and chronically). Psychiatric:  grossly normal mood and affect, speech fluent and appropriate, AOx3 Neurologic:  CN 2-12 grossly intact, moves all extremities in coordinated fashion, sensation intact    Radiological Exams on Admission: Dg Chest 2 View  Result Date: 02/04/2017 CLINICAL DATA:  Abdominal pain for 2 days. Nausea, vomiting. No chest pain or shortness of breath. EXAM: CHEST  2 VIEW COMPARISON:  None. FINDINGS: The heart  size and mediastinal contours are within normal limits. Both lungs are clear. The visualized skeletal structures are unremarkable. IMPRESSION: No active cardiopulmonary disease. Electronically Signed   By: Kathreen Devoid   On: 02/04/2017 19:14   US Abdomen Limited  Result Date: 02/04/2017 CLINICAL DATA:  Initial evaluation for elevated LFTs, abdominal pain. EXAM: ULTRASOUND ABDOMEN LIMITED RIGHT UPPER QUADRANT COMPARISON:  None. FINDINGS: Gallbladder: Layering echogenic material within the gallbladder lumen suspicious for small stones. Gallbladder wall measure within normal limits at 2.4 mm. No free pericholecystic fluid. Sonographic Murphy sign not assessed on this exam. Common bile duct: Prominent measuring up to 9 mm, but equivocal in size for patient age. Liver: No focal lesion identified. Diffusely increased echogenicity, suggestive of steatosis. Portal vein is patent on color Doppler imaging with normal direction of blood flow towards the liver. IMPRESSION: 1. Cholelithiasis without additional sonographic features to suggest acute cholecystitis. 2. Mildly prominent common bile duct measuring up to 9 mm, equivocal/upper limits normal for patient age. 3. Hepatic steatosis. Electronically Signed   By: Jeannine Boga M.D.   On: 02/04/2017 20:36    EKG: Independently reviewed.  NSR with rate 81; nonspecific ST changes with no evidence of acute ischemia   Labs on Admission: I have personally reviewed the available labs and  imaging studies at the time of the admission.  Pertinent labs:   Glucose 108 BUN 28/Creatinine 1.48/GFR 32; improved from baseline AP 155 Albumin 3.1; decreasing steadily since 2016 Lipase 1469 AST 627/ALT 429; previously WNL 12/17 Hgb 9.1 WBC 7.8 UA: 100 protein, otherwise unremarkable  Assessment/Plan Principal Problem:   Gallstone pancreatitis Active Problems:   Essential hypertension, benign   CKD (chronic kidney disease) stage 3, GFR 30-59 ml/min (HCC)   Anemia of chronic disease   Gallstone pancreatitis -Patient without prior h/o pancreatitis presenting with abdominal pain, elevated lipase, elevated LFTs -Her LDH is pending, but assuming it is <300, her Ranson's score is 2 with a mortality risk of 0.9%. -Will admit, as she is likely to remain in the hospital for several days to complete her evaluation and treatment and allow the pancreatitis time to improve prior to surgery -Strict NPO for now -Aggressive IVF hydration at least for the first 12 hours with LR at 150 cc/hr -Pain control with morphine 2 mg q2h prn.   -Nausea control with Zofran -Will admit, as she is likely to remain in the hospital through the weekend to allow the inflammation to cool -Strict NPO for now -Aggressive IVF hydration at least for the first 12 hours with LR at 200 cc/hr -Pain control with morphine 2 mg q1h prn -Nausea control with Zofran -Surgery will need to be consulted; it seems likely that they will wait at least a couple of days before attempting cholecystectomy to allow the LFTs to improve -GI will see patient in the AM and will likely need to perform ERCP; this may also need to be delayed for one to several days -Based on pre-morbid condition, would anticipate generally good surgical outcome and discharge by the weekend or early next week  HTN -Continue Cardizem and Lisinopril  CKD -Patient with reported h/o fibrillary glomerulonephritis resulting in nephrotic range proteinuria -She  is on very high-dose Lisinopril -Will decrease Lisinopril dose to 40 mg daily and start in the AM (hold tonight's dose)  Anemia -Reportedly stable by ED PA note although I see no evidence of prior testing in Epic or in Care Everywhere -Will follow with daily CBC for now  Hypothyroidism -Check  TSH -Continue Synthroid at current dose for now   DVT prophylaxis:  SCDs for now Code Status:  DNR - confirmed with patient/family Family Communication: Daughter present throughout evaluation  Disposition Plan:  Home once clinically improved Consults called: GI; will also need surgery  Admission status: Admit - It is my clinical opinion that admission to INPATIENT is reasonable and necessary because this patient will require at least 2 midnights in the hospital to treat this condition based on the medical complexity of the problems presented.  Given the aforementioned information, the predictability of an adverse outcome is felt to be significant.     Karmen Bongo MD [facility]  If note is complete, please contact covering daytime or nighttime physician. [email or web address] Password [email or web address]  02/04/2017, 11:03 PM

## 2017-02-04 NOTE — ED Notes (Signed)
Per MD order U/A needed. Pt states unable to use restroom at this time. Pt provided labeled specimen cup; advised to call for assistance OOB PRN when ready to use restroom and collect specimen. Pt confirmed understanding. [employer]

## 2017-02-04 NOTE — ED Triage Notes (Signed)
Per [facility] patient from home c/o epigastric pain since midnight. Patient had n/v earlier this morning and reports "know I am sick since I haven't vomited since 2016".  Patient reports eating chicken enchilada casserole yesterday and not sure if is what caused her upset stomach.  Patient reports that she had saltine crackers today around noon which have stayed down. Patient reports having formed BMs every time she has had to use restroom today.

## 2017-02-04 NOTE — ED Provider Notes (Signed)
[facility] DEPT Provider Note   CSN: [id number] Arrival date & time: 02/04/17  1709     History   Chief Complaint Chief Complaint  Patient presents with  . Abdominal Pain  . Emesis    HPI Wendy Walters is a 83 y.o. female presenting with epigastric pressure.  Patient states that she started to have epigastric pressure around 12:30 last night. The pressure is intermittent, lasting 30-45 minutes. She cannot reproduce the pressured nor make it go away. It is not related to food intake. Patient states that the pain was becoming more constant today. This afternoon, she had an episode of pain associated with diaphoresis, nausea, and feeling of syncope. She reports vomiting twice, once this morning with this afternoon. It is nonbloody and nonbilious. She denies diarrhea, but states she's had more frequent, normally formed, bowel movements. She denies fever, chills, chest pain, shortness of breath, lower abdominal pain, urinary symptoms, or leg pain or swelling. Currently she reports no pain, nausea, or symptoms. She reports history of MI in 2001 with stent placement. She states this feels very different. She denies history of abdominal surgeries. She denies sick contacts, recent travel, or abnormal foods.   HPI  Past Medical History:  Diagnosis Date  . Anemia   . Bradycardia 08/22/2015  . Carotid artery occlusion   . Chronic kidney disease    stage 3  . Coronary artery disease    s/p PCI 2001 of lad in setting of AWMI-rotational atherectomy with BMS -Dr Radford Pax  . Edema extremities 08/21/2016  . Fibrillary glomerulonephritis    w nephrotic range protenuria, following w neurology, managed with an ACE-I and ARB  . H/O Doppler ultrasound 01/2003   ,39% bilateral carotid stenosis.  . Hyperlipidemia   . Hypertension   . Hypothyroidism   . Osteoporosis    s/p actonel for about 7-8 years    Patient Active Problem List   Diagnosis Date Noted  . Gallstone pancreatitis 02/04/2017  .  Edema extremities 08/21/2016  . Bradycardia 08/22/2015  . Carotid artery disease ([place]) 03/02/2015  . Coronary atherosclerosis of native coronary artery 07/30/2013  . Pure hypercholesterolemia 07/30/2013  . Essential hypertension, benign 07/30/2013    Past Surgical History:  Procedure Laterality Date  . ANGIOPLASTY  2001  . CARDIAC CATHETERIZATION  2005   patent LAD stent  . EYE SURGERY Bilateral 2012   Cataract  . TONSILLECTOMY  1942    OB History    No data available       Home Medications    Prior to Admission medications   Medication Sig Start Date End Date Taking? Authorizing Provider  Ascorbic Acid (VITAMIN C) 1000 MG tablet Take 1,000 mg by mouth daily.   Yes [provider]  aspirin 81 MG tablet Take 81 mg by mouth daily.   Yes [provider]  atorvastatin (LIPITOR) 80 MG tablet Take 1 tablet (80 mg total) by mouth daily. 09/18/16  Yes Turner, Eber Hong, MD  B Complex Vitamins (VITAMIN B COMPLEX PO) Take 1 tablet by mouth daily.    Yes [provider]  Calcium Carbonate-Vit D-Min (CALCIUM 1200 PO) Take 1 tablet by mouth daily.   Yes [provider]  Coenzyme Q10 (COQ10) 200 MG CAPS Take 1 capsule by mouth daily.    Yes [provider]  diltiazem (CARDIZEM CD) 240 MG 24 hr capsule Take 240 mg by mouth daily. 08/08/16  Yes [provider]  ezetimibe (ZETIA) 10 MG tablet Take 1 tablet (10  mg total) by mouth daily. 02/20/16  Yes Turner, Eber Hong, MD  ferrous fumarate (HEMOCYTE - 106 MG FE) 325 (106 FE) MG TABS tablet Take 1 tablet by mouth daily.   Yes [provider]  levothyroxine (SYNTHROID, LEVOTHROID) 88 MCG tablet Take 88 mcg by mouth daily before breakfast.   Yes [provider]  lisinopril (PRINIVIL,ZESTRIL) 40 MG tablet Take 40 mg by mouth 2 (two) times daily.  12/28/14  Yes [provider]  Multiple Vitamin (MULTIVITAMIN) tablet Take 1 tablet by mouth daily.   Yes [provider]    niacin 500 MG tablet Take 500 mg by mouth 2 (two) times daily with a meal.   Yes [provider]  Omega-3 Fatty Acids (OMEGA 3 PO) Take 1 capsule by mouth 2 (two) times daily.   Yes [provider]  Probiotic Product (PROBIOTIC PO) Take 1 capsule by mouth every 3 (three) days.   Yes [provider]  sodium bicarbonate 650 MG tablet Take 650 mg by mouth 2 (two) times daily.   Yes [provider]  torsemide (DEMADEX) 20 MG tablet Take 20 mg by mouth daily.    Yes [provider]  zinc gluconate 50 MG tablet Take 50 mg by mouth daily.   Yes [provider]  alendronate (FOSAMAX) 70 MG tablet Take 70 mg by mouth once a week. Take with a full glass of water on an empty stomach.    [provider]    Family History Family History  Problem Relation Age of Onset  . Hypertension Mother   . Cancer - Prostate Father   . Cancer Father        Prostate  . Diabetes Daughter   . Hypertension Daughter     Social History Social History  Substance Use Topics  . Smoking status: Former Smoker    Quit date: 06/08/1970  . Smokeless tobacco: Never Used     Comment: quit in 1972  . Alcohol use No     Comment: quit in her 40s     Allergies   Codeine   Review of Systems Review of Systems  Constitutional: Positive for diaphoresis (resolved).  Gastrointestinal: Positive for abdominal pain (epigastric, resolved), nausea (resolved) and vomiting (resolved).  All other systems reviewed and are negative.    Physical Exam Updated Vital Signs BP 140/74   Pulse 79   Temp 99.6 F (37.6 C) (Rectal)   Resp 19   Ht 5\' 4"  (1.626 m)   Wt 59 kg (130 lb)   SpO2 92%   BMI 22.31 kg/m   Physical Exam  Constitutional: She is oriented to person, place, and time. She appears well-developed and well-nourished. No distress.  HENT:  Head: Normocephalic and atraumatic.  Eyes: Pupils are equal, round, and reactive to light. Conjunctivae and EOM are  normal.  Neck: Normal range of motion.  Cardiovascular: Normal rate, regular rhythm and intact distal pulses.   Pulmonary/Chest: Effort normal and breath sounds normal. No respiratory distress. She has no decreased breath sounds. She has no wheezes. She has no rales.  Abdominal: Soft. Normal appearance and bowel sounds are normal. She exhibits no distension. There is tenderness in the epigastric area. There is no rigidity, no rebound, no guarding, no tenderness at McBurney's point and negative Murphy's sign.  Minimal tenderness to palpation of epigastric area. No tenderness elsewhere. No rigidity or guarding. Normoactive bowel sounds 4.  Musculoskeletal: Normal range of motion. She exhibits no edema or tenderness.  Neurological: She is alert and oriented to person, place, and time.  Skin: Skin is warm and dry.  Psychiatric: She has a normal mood and affect.  Nursing note and vitals reviewed.    ED Treatments / Results  Labs (all labs ordered are listed, but only abnormal results are displayed) Labs Reviewed  CBC - Abnormal; Notable for the following:       Result Value   RBC 3.02 (*)    Hemoglobin 9.1 (*)    HCT 27.0 (*)    All other components within normal limits  COMPREHENSIVE METABOLIC PANEL - Abnormal; Notable for the following:    Glucose, Bld 108 (*)    BUN 28 (*)    Creatinine, Ser 1.48 (*)    Total Protein 6.0 (*)    Albumin 3.1 (*)    AST 627 (*)    ALT 429 (*)    Alkaline Phosphatase 155 (*)    GFR calc non Af Amer 32 (*)    GFR calc Af Amer 37 (*)    All other components within normal limits  LIPASE, BLOOD - Abnormal; Notable for the following:    Lipase 1,469 (*)    All other components within normal limits  URINALYSIS, ROUTINE W REFLEX MICROSCOPIC - Abnormal; Notable for the following:    Protein, ur 100 (*)    Squamous Epithelial / LPF 0-5 (*)    All other components within normal limits  DIFFERENTIAL - Abnormal; Notable for the following:    Lymphs Abs 0.3  (*)    All other components within normal limits  I-STAT TROPONIN, ED    EKG  EKG Interpretation  Date/Time:  Monday February 04 2017 17:24:26 EDT Ventricular Rate:  81 PR Interval:    QRS Duration: 102 QT Interval:  419 QTC Calculation: 487 R Axis:   -33 Text Interpretation:  Normal sinus rhythm Left axis deviation Anteroseptal infarct, age indeterminate No old tracing to compare Confirmed by Delora Fuel ([id number]) on 02/04/2017 5:32:07 PM       Radiology Dg Chest 2 View  Result Date: 02/04/2017 CLINICAL DATA:  Abdominal pain for 2 days. Nausea, vomiting. No chest pain or shortness of breath. EXAM: CHEST  2 VIEW COMPARISON:  None. FINDINGS: The heart size and mediastinal contours are within normal limits. Both lungs are clear. The visualized skeletal structures are unremarkable. IMPRESSION: No active cardiopulmonary disease. Electronically Signed   By: Kathreen Devoid   On: 02/04/2017 19:14   US Abdomen Limited  Result Date: 02/04/2017 CLINICAL DATA:  Initial evaluation for elevated LFTs, abdominal pain. EXAM: ULTRASOUND ABDOMEN LIMITED RIGHT UPPER QUADRANT COMPARISON:  None. FINDINGS: Gallbladder: Layering echogenic material within the gallbladder lumen suspicious for small stones. Gallbladder wall measure within normal limits at 2.4 mm. No free pericholecystic fluid. Sonographic Murphy sign not assessed on this exam. Common bile duct: Prominent measuring up to 9 mm, but equivocal in size for patient age. Liver: No focal lesion identified. Diffusely increased echogenicity, suggestive of steatosis. Portal vein is patent on color Doppler imaging with normal direction of blood flow towards the liver. IMPRESSION: 1. Cholelithiasis without additional sonographic features to suggest acute cholecystitis. 2. Mildly prominent common bile duct measuring up to 9 mm, equivocal/upper limits normal for patient age. 3. Hepatic steatosis. Electronically Signed   By: Jeannine Boga M.D.   On: 02/04/2017  20:36    Procedures Procedures (including critical care time)  Medications Ordered in ED Medications  gi cocktail (Maalox,Lidocaine,Donnatal) (30 mLs Oral Given 02/04/17  1822)  pantoprazole (PROTONIX) EC tablet 40 mg (40 mg Oral Given 02/04/17 1822)  sodium chloride 0.9 % bolus 500 mL (0 mLs Intravenous Stopped 02/04/17 2104)  ondansetron (ZOFRAN) injection 4 mg (4 mg Intravenous Given 02/04/17 1911)  morphine 4 MG/ML injection 4 mg (4 mg Intravenous Given 02/04/17 1912)     Initial Impression / Assessment and Plan / ED Course  I have reviewed the triage vital signs and the nursing notes.  Pertinent labs & imaging results that were available during my care of the patient were reviewed by me and considered in my medical decision making (see chart for details).     Patient presenting with one-day history of intermittent epigastric pressure and vomiting. Currently denying any symptoms. Symptoms are not related oral intake. Physical exam shows patient with mild tenderness in epigastrium, otherwise reassuring. She is afebrile, not tachycardic, blood pressure stable. Will order basic abdominal labs, troponin, EKG, chest x-ray, and UA. Case discussed with attending, Dr. Roxanne Mins evaluated the patient.  Lipase elevated at 1469. LFTs elevated as compared to 9 months ago. Troponin negative. EKG reassuring. Chest x-ray negative. UA negative for infection. Hemoglobin stable per chart review. Patient reporting return of nausea and pain, will give bolus of fluids, Zofran and morphine for symptom control. Will order right upper quadrant ultrasound.   Ultrasound shows cholelithiasis without obvious cholecystitis at this time. Bile prominent common bile duct at 9 mm. Patient states that she has never had a GI doctor, never had a colonoscopy or an EGD. Will consult with on call GI.  Discussed case with Dr. Carlean Purl from GI, who will consult with pt while she is in the hospital. Will discuss case with hosptialist  for admission.   Case discussed with Dr. Lorin Mercy, and pt to be admitted under hospitalist services.     Final Clinical Impressions(s) / ED Diagnoses   Final diagnoses:  Acute biliary pancreatitis, unspecified complication status    New Prescriptions New Prescriptions   No medications on file     Franchot Heidelberg, Hershal Coria 01/77/93 9030    Delora Fuel, MD 01/27/29 2328

## 2017-02-04 NOTE — ED Notes (Signed)
Bed: WA17 Expected date:  Expected time:  Means of arrival:  Comments: Ems abd pain

## 2017-02-04 NOTE — ED Notes (Signed)
ED Provider at bedside. 

## 2017-02-05 LAB — COMPREHENSIVE METABOLIC PANEL
ALBUMIN: 2.7 g/dL — AB (ref 3.5–5.0)
ALT: 1426 U/L — ABNORMAL HIGH (ref 14–54)
ANION GAP: 7 (ref 5–15)
AST: 1650 U/L — ABNORMAL HIGH (ref 15–41)
Alkaline Phosphatase: 164 U/L — ABNORMAL HIGH (ref 38–126)
BILIRUBIN TOTAL: 0.5 mg/dL (ref 0.3–1.2)
BUN: 26 mg/dL — ABNORMAL HIGH (ref 6–20)
CO2: 26 mmol/L (ref 22–32)
Calcium: 8.8 mg/dL — ABNORMAL LOW (ref 8.9–10.3)
Chloride: 104 mmol/L (ref 101–111)
Creatinine, Ser: 1.46 mg/dL — ABNORMAL HIGH (ref 0.44–1.00)
GFR calc non Af Amer: 33 mL/min — ABNORMAL LOW (ref >60)
GFR, EST AFRICAN AMERICAN: 38 mL/min — AB (ref >60)
GLUCOSE: 93 mg/dL (ref 65–99)
POTASSIUM: 4.3 mmol/L (ref 3.5–5.1)
SODIUM: 137 mmol/L (ref 135–145)
TOTAL PROTEIN: 5.3 g/dL — AB (ref 6.5–8.1)

## 2017-02-05 LAB — CBC
HEMATOCRIT: 23.6 % — AB (ref 36.0–46.0)
HEMOGLOBIN: 8.1 g/dL — AB (ref 12.0–15.0)
MCH: 30.7 pg (ref 26.0–34.0)
MCHC: 34.3 g/dL (ref 30.0–36.0)
MCV: 89.4 fL (ref 78.0–100.0)
Platelets: 192 10*3/uL (ref 150–400)
RBC: 2.64 MIL/uL — AB (ref 3.87–5.11)
RDW: 13.4 % (ref 11.5–15.5)
WBC: 4.1 10*3/uL (ref 4.0–10.5)

## 2017-02-05 LAB — LACTATE DEHYDROGENASE: LDH: 2183 U/L — ABNORMAL HIGH (ref 98–192)

## 2017-02-05 LAB — TSH: TSH: 2.052 u[IU]/mL (ref 0.350–4.500)

## 2017-02-05 NOTE — Care Management Note (Signed)
Case Management Note  Patient Details  Name: Arista Kettlewell MRN: [id number] Date of Birth: 1932/06/03  Subjective/Objective:                  pancreatitis  Action/Plan:  Patient lives alone Date:  February 05, 2017 Chart reviewed for concurrent status and case management needs.  Will continue to follow patient progress.  Discharge Planning: following for needs  Expected discharge date: February 08, 2017  Velva Harman, BSN, [place], [place]   Expected Discharge Date:                  Expected Discharge Plan:  Home/Self Care  In-House Referral:     Discharge planning Services  CM Consult  Post Acute Care Choice:    Choice offered to:     DME Arranged:    DME Agency:     HH Arranged:    HH Agency:     Status of Service:  In process, will continue to follow  If discussed at Long Length of Stay Meetings, dates discussed:    Additional Comments:  Leeroy Cha, RN 02/05/2017, 9:09 AM

## 2017-02-05 NOTE — Consult Note (Signed)
[facility] Consultation Note  Referring Provider: Dr. Dessa Phi Primary Care Physician:  Leighton Ruff, MD  Reason for Consultation:  pancreatitis  HPI: Wendy Walters is a 83 y.o. female admitted with intermittent two-day history of severe epigastric and right upper quadrant abdominal pain.  Evaluation showed elevated liver enzymes and elevated lipase.  Ultrasound showed gallstones, CBD 109mm, no cholecystitis, no CBD stone seen.  Pain is now pain free.  No prior history pancreatitis.  No alcohol.  No fevers, jaundice, recent weight loss.   Past Medical History:  Diagnosis Date  . Anemia   . Bradycardia 08/22/2015  . Carotid artery occlusion   . Chronic kidney disease    stage 3  . Coronary artery disease    s/p PCI 2001 of lad in setting of AWMI-rotational atherectomy with BMS -Dr Radford Pax  . Edema extremities 08/21/2016  . Fibrillary glomerulonephritis    w nephrotic range protenuria, following w neurology, managed with an ACE-I and ARB  . H/O Doppler ultrasound 01/2003   ,39% bilateral carotid stenosis.  . Hyperlipidemia   . Hypertension   . Hypothyroidism   . Osteoporosis    s/p actonel for about 7-8 years    Past Surgical History:  Procedure Laterality Date  . ANGIOPLASTY  2001  . CARDIAC CATHETERIZATION  2005   patent LAD stent  . EYE SURGERY Bilateral 2012   Cataract  . TONSILLECTOMY  1942    Prior to Admission medications   Medication Sig Start Date End Date Taking? Authorizing Provider  Ascorbic Acid (VITAMIN C) 1000 MG tablet Take 1,000 mg by mouth daily.   Yes [provider]  aspirin 81 MG tablet Take 81 mg by mouth daily.   Yes [provider]  atorvastatin (LIPITOR) 80 MG tablet Take 1 tablet (80 mg total) by mouth daily. 09/18/16  Yes Turner, Eber Hong, MD  B Complex Vitamins (VITAMIN B COMPLEX PO) Take 1 tablet by mouth daily.    Yes [provider]  Calcium Carbonate-Vit D-Min (CALCIUM 1200 PO) Take 1 tablet by  mouth daily.   Yes [provider]  Coenzyme Q10 (COQ10) 200 MG CAPS Take 1 capsule by mouth daily.    Yes [provider]  diltiazem (CARDIZEM CD) 240 MG 24 hr capsule Take 240 mg by mouth daily. 08/08/16  Yes [provider]  ezetimibe (ZETIA) 10 MG tablet Take 1 tablet (10 mg total) by mouth daily. 02/20/16  Yes Turner, Eber Hong, MD  ferrous fumarate (HEMOCYTE - 106 MG FE) 325 (106 FE) MG TABS tablet Take 1 tablet by mouth daily.   Yes [provider]  levothyroxine (SYNTHROID, LEVOTHROID) 88 MCG tablet Take 88 mcg by mouth daily before breakfast.   Yes [provider]  lisinopril (PRINIVIL,ZESTRIL) 40 MG tablet Take 40 mg by mouth 2 (two) times daily.  12/28/14  Yes [provider]  Multiple Vitamin (MULTIVITAMIN) tablet Take 1 tablet by mouth daily.   Yes [provider]  niacin 500 MG tablet Take 500 mg by mouth 2 (two) times daily with a meal.   Yes [provider]  Omega-3 Fatty Acids (OMEGA 3 PO) Take 1 capsule by mouth 2 (two) times daily.   Yes [provider]  Probiotic Product (PROBIOTIC PO) Take 1 capsule by mouth every 3 (three) days.   Yes [provider]  sodium bicarbonate 650 MG tablet Take 650 mg by mouth 2 (two) times daily.   Yes [provider]  torsemide (DEMADEX) 20  MG tablet Take 20 mg by mouth daily.    Yes [provider]  zinc gluconate 50 MG tablet Take 50 mg by mouth daily.   Yes [provider]  alendronate (FOSAMAX) 70 MG tablet Take 70 mg by mouth once a week. Take with a full glass of water on an empty stomach.    [provider]    Current Facility-Administered Medications  Medication Dose Route Frequency Provider Last Rate Last Dose  . acetaminophen (TYLENOL) tablet 650 mg  650 mg Oral Q6H PRN Karmen Bongo, MD   650 mg at 02/05/17 0741   Or  . acetaminophen (TYLENOL) suppository 650 mg  650 mg Rectal Q6H PRN Karmen Bongo, MD      .  diltiazem (CARDIZEM CD) 24 hr capsule 240 mg  240 mg Oral Daily Karmen Bongo, MD   240 mg at 02/05/17 1024  . Ferrous Fumarate (HEMOCYTE - 106 mg FE) tablet 106 mg of iron  106 mg of iron Oral Daily Karmen Bongo, MD   106 mg of iron at 02/05/17 1000  . lactated ringers infusion   Intravenous Continuous Dessa Phi Chahn-Yang, DO 75 mL/hr at 02/05/17 1040    . levothyroxine (SYNTHROID, LEVOTHROID) tablet 88 mcg  88 mcg Oral QAC breakfast Karmen Bongo, MD   88 mcg at 02/05/17 0741  . lisinopril (PRINIVIL,ZESTRIL) tablet 40 mg  40 mg Oral Daily Karmen Bongo, MD   40 mg at 02/05/17 1000  . morphine 4 MG/ML injection 2 mg  2 mg Intravenous Q2H PRN Karmen Bongo, MD      . ondansetron [facility]) tablet 4 mg  4 mg Oral Q6H PRN Karmen Bongo, MD       Or  . ondansetron [facility]) injection 4 mg  4 mg Intravenous Q6H PRN Karmen Bongo, MD      . sodium bicarbonate tablet 650 mg  650 mg Oral BID Karmen Bongo, MD   650 mg at 02/05/17 1000    Allergies as of 02/04/2017 - Review Complete 02/04/2017  Allergen Reaction Noted  . Codeine Nausea And Vomiting and Nausea Only 07/30/2013    Family History  Problem Relation Age of Onset  . Hypertension Mother   . Cancer - Prostate Father   . Cancer Father        Prostate  . Diabetes Daughter   . Hypertension Daughter     Social History   Social History  . Marital status: Widowed    Spouse name: N/A  . Number of children: N/A  . Years of education: N/A   Occupational History  . retired    Social History Main Topics  . Smoking status: Former Smoker    Quit date: 06/08/1970  . Smokeless tobacco: Never Used     Comment: quit in 1972  . Alcohol use No     Comment: quit in her 52s  . Drug use: No  . Sexual activity: Not on file   Other Topics Concern  . Not on file   Social History Narrative  . No narrative on file    Review of Systems: As per HPI, all others negative  Physical Exam: Vital signs in last 24  hours: Temp:  [98.1 F (36.7 C)-99.6 F (37.6 C)] 98.6 F (37 C) (10/02 0424) Pulse Rate:  [77-83] 77 (10/02 1029) Resp:  [15-24] 18 (10/02 0424) BP: (129-169)/(67-83) 169/67 (10/02 1029) SpO2:  [89 %-100 %] 96 % (10/02 0424) Weight:  [130 lb (59 kg)] 130 lb (59 kg) (  10/01 1719) Last BM Date: 02/04/17 General:   Alert, somewhat chronically thin-appearing, pleasant and cooperative in NAD Head:  Normocephalic and atraumatic. Eyes:  Sclera clear, no icterus.   Conjunctiva pink. Ears:  Normal auditory acuity. Nose:  No deformity, discharge,  or lesions. Mouth:  Poor dentition, No deformity or lesions.  Oropharynx pink & moist. Abdomen:  Soft, nontender and nondistended. No masses, hepatosplenomegaly or hernias noted. Normal bowel sounds, without guarding, and without rebound.     Msk:  Symmetrical without gross deformities. Normal posture. Pulses:  Normal pulses noted. Extremities:  Without clubbing or edema. Neurologic:  Alert and  oriented x4;  grossly normal neurologically. Skin:  Frail and fragile, otherwise intact without significant lesions or rashes. Psych:  Alert and cooperative. Normal mood and affect.   Lab Results:  Recent Labs  02/04/17 1745 02/05/17 0600  WBC 7.8 4.1  HGB 9.1* 8.1*  HCT 27.0* 23.6*  PLT 226 192   BMET  Recent Labs  02/04/17 1745 02/05/17 0600  NA 135 137  K 3.9 4.3  CL 102 104  CO2 24 26  GLUCOSE 108* 93  BUN 28* 26*  CREATININE 1.48* 1.46*  CALCIUM 9.5 8.8*   LFT  Recent Labs  02/05/17 0600  PROT 5.3*  ALBUMIN 2.7*  AST 1,650*  ALT 1,426*  ALKPHOS 164*  BILITOT 0.5   PT/INR No results for input(s): LABPROT, INR in the last 72 hours.  Studies/Results: Dg Chest 2 View  Result Date: 02/04/2017 CLINICAL DATA:  Abdominal pain for 2 days. Nausea, vomiting. No chest pain or shortness of breath. EXAM: CHEST  2 VIEW COMPARISON:  None. FINDINGS: The heart size and mediastinal contours are within normal limits. Both lungs are  clear. The visualized skeletal structures are unremarkable. IMPRESSION: No active cardiopulmonary disease. Electronically Signed   By: Kathreen Devoid   On: 02/04/2017 19:14   US Abdomen Limited  Result Date: 02/04/2017 CLINICAL DATA:  Initial evaluation for elevated LFTs, abdominal pain. EXAM: ULTRASOUND ABDOMEN LIMITED RIGHT UPPER QUADRANT COMPARISON:  None. FINDINGS: Gallbladder: Layering echogenic material within the gallbladder lumen suspicious for small stones. Gallbladder wall measure within normal limits at 2.4 mm. No free pericholecystic fluid. Sonographic Murphy sign not assessed on this exam. Common bile duct: Prominent measuring up to 9 mm, but equivocal in size for patient age. Liver: No focal lesion identified. Diffusely increased echogenicity, suggestive of steatosis. Portal vein is patent on color Doppler imaging with normal direction of blood flow towards the liver. IMPRESSION: 1. Cholelithiasis without additional sonographic features to suggest acute cholecystitis. 2. Mildly prominent common bile duct measuring up to 9 mm, equivocal/upper limits normal for patient age. 3. Hepatic steatosis. Electronically Signed   By: Jeannine Boga M.D.   On: 02/04/2017 20:36   Impression:  1.  Gallstone pancreatitis. 2.  Abdominal pain, from #1 above, resolved.  CBD stone passed versus ball valving? 3.  Elevated LFTs (hepatocellular), increasing, likely related to choledocholithiasis.  Plan:  1.  Clear liquid diet OK. 2.  IV fluids. 3.  Repeat LFTs in AM; if not significantly improved, consider MRI/MRCP without contrast to assess for choledocholithiasis; if LFTs have significantly improved, consider surgical consult for consideration of cholecystectomy + IOC. 4.  Will follow.   LOS: 1 day   Mayleen Borrero M  02/05/2017, 12:49 PM  Cell [phone] If no answer or after 5 PM call [phone]

## 2017-02-05 NOTE — Progress Notes (Signed)
PROGRESS NOTE    Wendy Walters  FXT:[id number] DOB: [birth year] DOA: 02/04/2017 PCP: Leighton Ruff, MD     Brief Narrative:  Wendy Walters is a 83 y.o. female with medical history significant of hypothyroidism, HTN, HLD, fibrillary glomerulonephritis, CAD, stage 3 CKD, and anemia presenting with abdominal pain. She admitted to severe epigastric and right upper quadrant abdominal pain with nausea and vomiting. Presented to the emergency department where workup revealed elevated lipase, elevated LFT. Right upper quadrant ultrasound showed cholelithiasis without obvious cholecystitis. Also revealed dilated common bile duct. Patient was admitted for further evaluation as well as GI consultation.   Assessment & Plan:   Principal Problem:   Gallstone pancreatitis Active Problems:   Essential hypertension, benign   CKD (chronic kidney disease) stage 3, GFR 30-59 ml/min (HCC)   Anemia of chronic disease   Gallstone pancreatitis -Continue nothing by mouth, IV fluids -GI consulted (patient is unassigned, consulted [facility] who was covering [facility]) -Continue pain control, antiemetic  Essential hypertension -Continue Cardizem, lisinopril  Chronic kidney disease stage III -Baseline creatinine 1.7 -History of fibrillary glomerulonephritis -Continue bicarbonate -Stable   Hypothyroidism -Continue Synthroid  CAD s/p PCI 2001 -Hold aspirin pending possible procedure  -Hold statin in setting of elevated LFT   Chronic LE edema -Hold torsemide in setting of volume resuscitation during pancreatitis    DVT prophylaxis: SCD pending possible ERCP Code Status: DO NOT RESUSCITATE Family Communication: Daughter at bedside Disposition Plan: Pending improvement   Consultants:   [facility]  Procedures:   None  Antimicrobials:  Anti-infectives    None        Subjective: Patient states that she received morphine this morning and is currently pain-free. She denies any further nausea  or vomiting episodes. Had a bowel movement yesterday, which appeared normal. No other complaints such as fevers or chills, chest pain or shortness of breath.  Objective: Vitals:   02/04/17 2258 02/04/17 2259 02/05/17 0424 02/05/17 1029  BP:  (!) 145/74 (!) 158/83 (!) 169/67  Pulse:  79 79 77  Resp:  20 18   Temp:  98.6 F (37 C) 98.6 F (37 C)   TempSrc:  Oral Oral   SpO2:  90% 96%   Weight:      Height: 5\' 4"  (1.626 m)       Intake/Output Summary (Last 24 hours) at 02/05/17 1155 Last data filed at 02/05/17 1100  Gross per 24 hour  Intake            977.5 ml  Output                0 ml  Net            977.5 ml   Filed Weights   02/04/17 1719  Weight: 59 kg (130 lb)    Examination:  General exam: Appears calm and comfortable  Respiratory system: Clear to auscultation. Respiratory effort normal. Cardiovascular system: S1 & S2 heard, RRR. No JVD, murmurs, rubs, gallops or clicks. No pedal edema. Gastrointestinal system: Abdomen is nondistended, soft and nontender to palpation (received morphine this AM). No organomegaly or masses felt. Normal bowel sounds heard. Central nervous system: Alert and oriented. No focal neurological deficits. Extremities: Symmetric 5 x 5 power. Skin: No rashes, lesions or ulcers Psychiatry: Judgement and insight appear normal. Mood & affect appropriate.   Data Reviewed: I have personally reviewed following labs and imaging studies  CBC:  Recent Labs Lab 02/04/17 1745 02/05/17 0600  WBC 7.8 4.1  NEUTROABS  6.6  --   HGB 9.1* 8.1*  HCT 27.0* 23.6*  MCV 89.4 89.4  PLT 226 086   Basic Metabolic Panel:  Recent Labs Lab 02/04/17 1745 02/05/17 0600  NA 135 137  K 3.9 4.3  CL 102 104  CO2 24 26  GLUCOSE 108* 93  BUN 28* 26*  CREATININE 1.48* 1.46*  CALCIUM 9.5 8.8*   GFR: Estimated Creatinine Clearance: 26.1 mL/min (A) (by C-G formula based on SCr of 1.46 mg/dL (H)). Liver Function Tests:  Recent Labs Lab 02/04/17 1745  02/05/17 0600  AST 627* 1,650*  ALT 429* 1,426*  ALKPHOS 155* 164*  BILITOT 0.9 0.5  PROT 6.0* 5.3*  ALBUMIN 3.1* 2.7*    Recent Labs Lab 02/04/17 1745  LIPASE 1,469*   No results for input(s): AMMONIA in the last 168 hours. Coagulation Profile: No results for input(s): INR, PROTIME in the last 168 hours. Cardiac Enzymes: No results for input(s): CKTOTAL, CKMB, CKMBINDEX, TROPONINI in the last 168 hours. BNP (last 3 results) No results for input(s): PROBNP in the last 8760 hours. HbA1C: No results for input(s): HGBA1C in the last 72 hours. CBG: No results for input(s): GLUCAP in the last 168 hours. Lipid Profile: No results for input(s): CHOL, HDL, LDLCALC, TRIG, CHOLHDL, LDLDIRECT in the last 72 hours. Thyroid Function Tests:  Recent Labs  02/05/17 0026  TSH 2.052   Anemia Panel: No results for input(s): VITAMINB12, FOLATE, FERRITIN, TIBC, IRON, RETICCTPCT in the last 72 hours. Sepsis Labs: No results for input(s): PROCALCITON, LATICACIDVEN in the last 168 hours.  No results found for this or any previous visit (from the past 240 hour(s)).     Radiology Studies: Dg Chest 2 View  Result Date: 02/04/2017 CLINICAL DATA:  Abdominal pain for 2 days. Nausea, vomiting. No chest pain or shortness of breath. EXAM: CHEST  2 VIEW COMPARISON:  None. FINDINGS: The heart size and mediastinal contours are within normal limits. Both lungs are clear. The visualized skeletal structures are unremarkable. IMPRESSION: No active cardiopulmonary disease. Electronically Signed   By: Kathreen Devoid   On: 02/04/2017 19:14   US Abdomen Limited  Result Date: 02/04/2017 CLINICAL DATA:  Initial evaluation for elevated LFTs, abdominal pain. EXAM: ULTRASOUND ABDOMEN LIMITED RIGHT UPPER QUADRANT COMPARISON:  None. FINDINGS: Gallbladder: Layering echogenic material within the gallbladder lumen suspicious for small stones. Gallbladder wall measure within normal limits at 2.4 mm. No free  pericholecystic fluid. Sonographic Murphy sign not assessed on this exam. Common bile duct: Prominent measuring up to 9 mm, but equivocal in size for patient age. Liver: No focal lesion identified. Diffusely increased echogenicity, suggestive of steatosis. Portal vein is patent on color Doppler imaging with normal direction of blood flow towards the liver. IMPRESSION: 1. Cholelithiasis without additional sonographic features to suggest acute cholecystitis. 2. Mildly prominent common bile duct measuring up to 9 mm, equivocal/upper limits normal for patient age. 3. Hepatic steatosis. Electronically Signed   By: Jeannine Boga M.D.   On: 02/04/2017 20:36      Scheduled Meds: . diltiazem  240 mg Oral Daily  . Ferrous Fumarate  106 mg of iron Oral Daily  . levothyroxine  88 mcg Oral QAC breakfast  . lisinopril  40 mg Oral Daily  . sodium bicarbonate  650 mg Oral BID   Continuous Infusions: . lactated ringers 75 mL/hr at 02/05/17 1040     LOS: 1 day    Time spent: 40 minutes   Dessa Phi, DO [facility] [email or web address] Password  [email or web address] 02/05/2017, 11:55 AM

## 2017-02-06 LAB — CBC WITH DIFFERENTIAL/PLATELET
Basophils Absolute: 0 10*3/uL (ref 0.0–0.1)
Basophils Relative: 0 %
EOS ABS: 0.1 10*3/uL (ref 0.0–0.7)
Eosinophils Relative: 2 %
HEMATOCRIT: 23.9 % — AB (ref 36.0–46.0)
HEMOGLOBIN: 8.2 g/dL — AB (ref 12.0–15.0)
LYMPHS ABS: 0.6 10*3/uL — AB (ref 0.7–4.0)
Lymphocytes Relative: 8 %
MCH: 30.6 pg (ref 26.0–34.0)
MCHC: 34.3 g/dL (ref 30.0–36.0)
MCV: 89.2 fL (ref 78.0–100.0)
MONOS PCT: 11 %
Monocytes Absolute: 0.8 10*3/uL (ref 0.1–1.0)
Neutro Abs: 6 10*3/uL (ref 1.7–7.7)
Neutrophils Relative %: 79 %
Platelets: 183 10*3/uL (ref 150–400)
RBC: 2.68 MIL/uL — ABNORMAL LOW (ref 3.87–5.11)
RDW: 13.6 % (ref 11.5–15.5)
WBC: 7.5 10*3/uL (ref 4.0–10.5)

## 2017-02-06 LAB — COMPREHENSIVE METABOLIC PANEL
ALK PHOS: 166 U/L — AB (ref 38–126)
ALT: 837 U/L — ABNORMAL HIGH (ref 14–54)
ANION GAP: 7 (ref 5–15)
AST: 574 U/L — ABNORMAL HIGH (ref 15–41)
Albumin: 2.4 g/dL — ABNORMAL LOW (ref 3.5–5.0)
BILIRUBIN TOTAL: 0.6 mg/dL (ref 0.3–1.2)
BUN: 25 mg/dL — ABNORMAL HIGH (ref 6–20)
CALCIUM: 8.4 mg/dL — AB (ref 8.9–10.3)
CO2: 26 mmol/L (ref 22–32)
Chloride: 104 mmol/L (ref 101–111)
Creatinine, Ser: 1.49 mg/dL — ABNORMAL HIGH (ref 0.44–1.00)
GFR calc Af Amer: 37 mL/min — ABNORMAL LOW (ref >60)
GFR calc non Af Amer: 32 mL/min — ABNORMAL LOW (ref >60)
Glucose, Bld: 92 mg/dL (ref 65–99)
POTASSIUM: 3.8 mmol/L (ref 3.5–5.1)
SODIUM: 137 mmol/L (ref 135–145)
TOTAL PROTEIN: 5.1 g/dL — AB (ref 6.5–8.1)

## 2017-02-06 LAB — MAGNESIUM: MAGNESIUM: 1.5 mg/dL — AB (ref 1.7–2.4)

## 2017-02-06 MED ORDER — MAGNESIUM SULFATE 2 GM/50ML IV SOLN
2.0000 g | Freq: Once | INTRAVENOUS | Status: AC
  Administered 2017-02-06: 2 g via INTRAVENOUS
  Filled 2017-02-06: qty 50

## 2017-02-06 MED ORDER — POTASSIUM CHLORIDE 10 MEQ/100ML IV SOLN
10.0000 meq | INTRAVENOUS | Status: AC
  Administered 2017-02-06 (×3): 10 meq via INTRAVENOUS
  Filled 2017-02-06 (×3): qty 100

## 2017-02-06 NOTE — Progress Notes (Signed)
Subjective: No abdominal pain. Tolerating liquid diet.  Objective: Vital signs in last 24 hours: Temp:  [98.6 F (37 C)-100.9 F (38.3 C)] 98.6 F (37 C) (10/03 0507) Pulse Rate:  [75-93] 75 (10/03 0949) Resp:  [15-16] 16 (10/03 0507) BP: (140-166)/(60-82) 142/73 (10/03 0949) SpO2:  [88 %-95 %] 95 % (10/03 0507) Weight change:  Last BM Date: 02/04/17  PE: GEN:  Elderly, NAD  Lab Results: CBC    Component Value Date/Time   WBC 7.5 02/06/2017 0552   RBC 2.68 (L) 02/06/2017 0552   HGB 8.2 (L) 02/06/2017 0552   HCT 23.9 (L) 02/06/2017 0552   PLT 183 02/06/2017 0552   MCV 89.2 02/06/2017 0552   MCH 30.6 02/06/2017 0552   MCHC 34.3 02/06/2017 0552   RDW 13.6 02/06/2017 0552   LYMPHSABS 0.6 (L) 02/06/2017 0552   MONOABS 0.8 02/06/2017 0552   EOSABS 0.1 02/06/2017 0552   BASOSABS 0.0 02/06/2017 0552   = CMP     Component Value Date/Time   NA 137 02/06/2017 0552   NA 141 08/21/2016 1046   K 3.8 02/06/2017 0552   CL 104 02/06/2017 0552   CO2 26 02/06/2017 0552   GLUCOSE 92 02/06/2017 0552   BUN 25 (H) 02/06/2017 0552   BUN 49 (H) 08/21/2016 1046   CREATININE 1.49 (H) 02/06/2017 0552   CREATININE 1.72 (H) 08/22/2015 1122   CALCIUM 8.4 (L) 02/06/2017 0552   PROT 5.1 (L) 02/06/2017 0552   ALBUMIN 2.4 (L) 02/06/2017 0552   AST 574 (H) 02/06/2017 0552   ALT 837 (H) 02/06/2017 0552   ALKPHOS 166 (H) 02/06/2017 0552   BILITOT 0.6 02/06/2017 0552   GFRNONAA 32 (L) 02/06/2017 0552   GFRAA 37 (L) 02/06/2017 0552    Assessment:  1.  Gallstone pancreatitis. 2.  Abdominal pain,resolved.  Possible passed CBD stone. 3.  Elevated LFTs (hepatocellular), decreasing, possible passed CBD stone  Plan:  1.  Surgical consult for consideration of cholecystectomy. 2.  Given decrease LFTs, will defer decision of MRCP versus intraoperative cholangiogram to surgical team. 3.  Will follow.    Landry Dyke 02/06/2017, 11:16 AM   Cell [phone] If no answer or after 5  PM call [phone]

## 2017-02-06 NOTE — Consult Note (Signed)
Reason for Consult:  Nausea and vomiting with gallstone pancreatitis Referring Physician:  Dr.A Reesa Chew CC:  Nausea and vomiting PCP:  Leighton Ruff, MD   Wendy Walters is an 83 y.o. female.   HPI: Pt presents with 2 days history of abdominal pain, nausea and vomiting.  Onset after eating a enchilada casserole.  She started having pain on 10/1, and then she started having nausea and vomiting.  Pain was across the entire upper abdomen.  Nothing made it better.  Work up in the ED shows she was afebrile and VSS.  On 02/04/17:Creatinine is 1.48, BUN 28, lipase 1469, AST 627,ALT429, alk phos 155, T bil 0.9.  AST up to 1650, ALT 1426, bilirubin 0.5 on 02/05/17.  Today bilirubin is stable at 0.6 AST and ALT are better.  Mag 1.5.  WBC is 7.5 today.  Admit ultrasound shows: Layering echogenic material within the gallbladder lumen suspicious for small stones. Gallbladder wall measure within normal limits at 2.4 mm. No free pericholecystic fluid. Sonographic Murphy sign not assessed on this exam. Common bile duct:  Prominent measuring up to 9 mm, but equivocal in size for patient age.She is being seen by GI and they will decide on possible MRCP,  There is some comfort with LFT's trending down.  Her prior Creatinines are up to 1.75, so she is most likely at her baseline currently.      Past Medical History:  Diagnosis Date  . Anemia   . Bradycardia 08/22/2015  . Carotid artery occlusion   . Chronic kidney disease    stage 3  . Coronary artery disease    s/p PCI 2001 of lad in setting of AWMI-rotational atherectomy with BMS -Dr Radford Pax  . Edema extremities 08/21/2016  . Fibrillary glomerulonephritis    w nephrotic range protenuria, following w neurology, managed with an ACE-I and ARB  . H/O Doppler ultrasound 01/2003   ,39% bilateral carotid stenosis.  . Hyperlipidemia   . Hypertension   . Hypothyroidism   . Osteoporosis    s/p actonel for about 7-8 years    Past Surgical History:  Procedure  Laterality Date  . ANGIOPLASTY  2001  . CARDIAC CATHETERIZATION  2005   patent LAD stent  . EYE SURGERY Bilateral 2012   Cataract  . TONSILLECTOMY  1942    Family History  Problem Relation Age of Onset  . Hypertension Mother   . Cancer - Prostate Father   . Cancer Father        Prostate  . Diabetes Daughter   . Hypertension Daughter     Social History:  reports that she quit smoking about 46 years ago. She has never used smokeless tobacco. She reports that she does not drink alcohol or use drugs.  Allergies:  Allergies  Allergen Reactions  . Codeine Nausea And Vomiting and Nausea Only    Medications:  Prior to Admission:  Prescriptions Prior to Admission  Medication Sig Dispense Refill Last Dose  . Ascorbic Acid (VITAMIN C) 1000 MG tablet Take 1,000 mg by mouth daily.   02/04/2017 at Unknown time  . aspirin 81 MG tablet Take 81 mg by mouth daily.   02/04/2017 at Unknown time  . atorvastatin (LIPITOR) 80 MG tablet Take 1 tablet (80 mg total) by mouth daily. 90 tablet 3 02/03/2017 at Unknown time  . B Complex Vitamins (VITAMIN B COMPLEX PO) Take 1 tablet by mouth daily.    02/04/2017 at Unknown time  . Calcium Carbonate-Vit D-Min (CALCIUM 1200 PO) Take  1 tablet by mouth daily.   02/04/2017 at Unknown time  . Coenzyme Q10 (COQ10) 200 MG CAPS Take 1 capsule by mouth daily.    02/04/2017 at Unknown time  . diltiazem (CARDIZEM CD) 240 MG 24 hr capsule Take 240 mg by mouth daily.   02/03/2017 at 2200  . ezetimibe (ZETIA) 10 MG tablet Take 1 tablet (10 mg total) by mouth daily. 90 tablet 3 02/03/2017 at Unknown time  . ferrous fumarate (HEMOCYTE - 106 MG FE) 325 (106 FE) MG TABS tablet Take 1 tablet by mouth daily.   02/04/2017 at Unknown time  . levothyroxine (SYNTHROID, LEVOTHROID) 88 MCG tablet Take 88 mcg by mouth daily before breakfast.   02/04/2017 at Unknown time  . lisinopril (PRINIVIL,ZESTRIL) 40 MG tablet Take 40 mg by mouth 2 (two) times daily.    02/04/2017 at Unknown time  .  Multiple Vitamin (MULTIVITAMIN) tablet Take 1 tablet by mouth daily.   02/04/2017 at Unknown time  . niacin 500 MG tablet Take 500 mg by mouth 2 (two) times daily with a meal.   02/04/2017 at Unknown time  . Omega-3 Fatty Acids (OMEGA 3 PO) Take 1 capsule by mouth 2 (two) times daily.   02/04/2017 at Unknown time  . Probiotic Product (PROBIOTIC PO) Take 1 capsule by mouth every 3 (three) days.   02/03/2017 at Unknown time  . sodium bicarbonate 650 MG tablet Take 650 mg by mouth 2 (two) times daily.   02/04/2017 at Unknown time  . torsemide (DEMADEX) 20 MG tablet Take 20 mg by mouth daily.    02/04/2017 at Unknown time  . zinc gluconate 50 MG tablet Take 50 mg by mouth daily.   02/03/2017 at Unknown time  . alendronate (FOSAMAX) 70 MG tablet Take 70 mg by mouth once a week. Take with a full glass of water on an empty stomach.   02/02/2017 at unknown time   Scheduled: . diltiazem  240 mg Oral Daily  . Ferrous Fumarate  106 mg of iron Oral Daily  . levothyroxine  88 mcg Oral QAC breakfast  . lisinopril  40 mg Oral Daily  . sodium bicarbonate  650 mg Oral BID   Continuous: . lactated ringers 75 mL/hr at 02/05/17 1352  . potassium chloride 10 mEq (02/06/17 1112)   Anti-infectives    None      Results for orders placed or performed during the hospital encounter of 02/04/17 (from the past 48 hour(s))  Urinalysis, Routine w reflex microscopic     Status: Abnormal   Collection Time: 02/04/17  5:36 PM  Result Value Ref Range   Color, Urine YELLOW YELLOW   APPearance CLEAR CLEAR   Specific Gravity, Urine 1.008 1.005 - 1.030   pH 6.0 5.0 - 8.0   Glucose, UA NEGATIVE NEGATIVE mg/dL   Hgb urine dipstick NEGATIVE NEGATIVE   Bilirubin Urine NEGATIVE NEGATIVE   Ketones, ur NEGATIVE NEGATIVE mg/dL   Protein, ur 100 (A) NEGATIVE mg/dL   Nitrite NEGATIVE NEGATIVE   Leukocytes, UA NEGATIVE NEGATIVE   RBC / HPF 0-5 0 - 5 RBC/hpf   WBC, UA 0-5 0 - 5 WBC/hpf   Bacteria, UA NONE SEEN NONE SEEN   Squamous  Epithelial / LPF 0-5 (A) NONE SEEN   Hyaline Casts, UA PRESENT   CBC     Status: Abnormal   Collection Time: 02/04/17  5:45 PM  Result Value Ref Range   WBC 7.8 4.0 - 10.5 K/uL   RBC 3.02 (L)  3.87 - 5.11 MIL/uL   Hemoglobin 9.1 (L) 12.0 - 15.0 g/dL   HCT 27.0 (L) 36.0 - 46.0 %   MCV 89.4 78.0 - 100.0 fL   MCH 30.1 26.0 - 34.0 pg   MCHC 33.7 30.0 - 36.0 g/dL   RDW 13.2 11.5 - 15.5 %   Platelets 226 150 - 400 K/uL  Comprehensive metabolic panel     Status: Abnormal   Collection Time: 02/04/17  5:45 PM  Result Value Ref Range   Sodium 135 135 - 145 mmol/L   Potassium 3.9 3.5 - 5.1 mmol/L   Chloride 102 101 - 111 mmol/L   CO2 24 22 - 32 mmol/L   Glucose, Bld 108 (H) 65 - 99 mg/dL   BUN 28 (H) 6 - 20 mg/dL   Creatinine, Ser 1.48 (H) 0.44 - 1.00 mg/dL   Calcium 9.5 8.9 - 10.3 mg/dL   Total Protein 6.0 (L) 6.5 - 8.1 g/dL   Albumin 3.1 (L) 3.5 - 5.0 g/dL   AST 627 (H) 15 - 41 U/L   ALT 429 (H) 14 - 54 U/L   Alkaline Phosphatase 155 (H) 38 - 126 U/L   Total Bilirubin 0.9 0.3 - 1.2 mg/dL   GFR calc non Af Amer 32 (L) >60 mL/min   GFR calc Af Amer 37 (L) >60 mL/min    Comment: (NOTE) The eGFR has been calculated using the CKD EPI equation. This calculation has not been validated in all clinical situations. eGFR's persistently <60 mL/min signify possible Chronic Kidney Disease.    Anion gap 9 5 - 15  Lipase, blood     Status: Abnormal   Collection Time: 02/04/17  5:45 PM  Result Value Ref Range   Lipase 1,469 (H) 11 - 51 U/L    Comment: RESULTS CONFIRMED BY MANUAL DILUTION  Differential     Status: Abnormal   Collection Time: 02/04/17  5:45 PM  Result Value Ref Range   Neutrophils Relative % 85 %   Neutro Abs 6.6 1.7 - 7.7 K/uL   Lymphocytes Relative 4 %   Lymphs Abs 0.3 (L) 0.7 - 4.0 K/uL   Monocytes Relative 10 %   Monocytes Absolute 0.8 0.1 - 1.0 K/uL   Eosinophils Relative 1 %   Eosinophils Absolute 0.0 0.0 - 0.7 K/uL   Basophils Relative 0 %   Basophils Absolute 0.0  0.0 - 0.1 K/uL  I-Stat Troponin, ED (not at [facility])     Status: None   Collection Time: 02/04/17  6:07 PM  Result Value Ref Range   Troponin i, poc 0.00 0.00 - 0.08 ng/mL   Comment 3            Comment: Due to the release kinetics of cTnI, a negative result within the first hours of the onset of symptoms does not rule out myocardial infarction with certainty. If myocardial infarction is still suspected, repeat the test at appropriate intervals.   Lactate dehydrogenase     Status: Abnormal   Collection Time: 02/05/17 12:26 AM  Result Value Ref Range   LDH 2,183 (H) 98 - 192 U/L  TSH     Status: None   Collection Time: 02/05/17 12:26 AM  Result Value Ref Range   TSH 2.052 0.350 - 4.500 uIU/mL    Comment: Performed by a 3rd Generation assay with a functional sensitivity of <=0.01 uIU/mL.  Comprehensive metabolic panel     Status: Abnormal   Collection Time: 02/05/17  6:00 AM  Result  Value Ref Range   Sodium 137 135 - 145 mmol/L   Potassium 4.3 3.5 - 5.1 mmol/L   Chloride 104 101 - 111 mmol/L   CO2 26 22 - 32 mmol/L   Glucose, Bld 93 65 - 99 mg/dL   BUN 26 (H) 6 - 20 mg/dL   Creatinine, Ser 1.46 (H) 0.44 - 1.00 mg/dL   Calcium 8.8 (L) 8.9 - 10.3 mg/dL   Total Protein 5.3 (L) 6.5 - 8.1 g/dL   Albumin 2.7 (L) 3.5 - 5.0 g/dL   AST 1,650 (H) 15 - 41 U/L   ALT 1,426 (H) 14 - 54 U/L   Alkaline Phosphatase 164 (H) 38 - 126 U/L   Total Bilirubin 0.5 0.3 - 1.2 mg/dL   GFR calc non Af Amer 33 (L) >60 mL/min   GFR calc Af Amer 38 (L) >60 mL/min    Comment: (NOTE) The eGFR has been calculated using the CKD EPI equation. This calculation has not been validated in all clinical situations. eGFR's persistently <60 mL/min signify possible Chronic Kidney Disease.    Anion gap 7 5 - 15  CBC     Status: Abnormal   Collection Time: 02/05/17  6:00 AM  Result Value Ref Range   WBC 4.1 4.0 - 10.5 K/uL   RBC 2.64 (L) 3.87 - 5.11 MIL/uL   Hemoglobin 8.1 (L) 12.0 - 15.0 g/dL   HCT 23.6 (L) 36.0 -  46.0 %   MCV 89.4 78.0 - 100.0 fL   MCH 30.7 26.0 - 34.0 pg   MCHC 34.3 30.0 - 36.0 g/dL   RDW 13.4 11.5 - 15.5 %   Platelets 192 150 - 400 K/uL  CBC with Differential/Platelet     Status: Abnormal   Collection Time: 02/06/17  5:52 AM  Result Value Ref Range   WBC 7.5 4.0 - 10.5 K/uL   RBC 2.68 (L) 3.87 - 5.11 MIL/uL   Hemoglobin 8.2 (L) 12.0 - 15.0 g/dL   HCT 23.9 (L) 36.0 - 46.0 %   MCV 89.2 78.0 - 100.0 fL   MCH 30.6 26.0 - 34.0 pg   MCHC 34.3 30.0 - 36.0 g/dL   RDW 13.6 11.5 - 15.5 %   Platelets 183 150 - 400 K/uL   Neutrophils Relative % 79 %   Neutro Abs 6.0 1.7 - 7.7 K/uL   Lymphocytes Relative 8 %   Lymphs Abs 0.6 (L) 0.7 - 4.0 K/uL   Monocytes Relative 11 %   Monocytes Absolute 0.8 0.1 - 1.0 K/uL   Eosinophils Relative 2 %   Eosinophils Absolute 0.1 0.0 - 0.7 K/uL   Basophils Relative 0 %   Basophils Absolute 0.0 0.0 - 0.1 K/uL  Comprehensive metabolic panel     Status: Abnormal   Collection Time: 02/06/17  5:52 AM  Result Value Ref Range   Sodium 137 135 - 145 mmol/L   Potassium 3.8 3.5 - 5.1 mmol/L   Chloride 104 101 - 111 mmol/L   CO2 26 22 - 32 mmol/L   Glucose, Bld 92 65 - 99 mg/dL   BUN 25 (H) 6 - 20 mg/dL   Creatinine, Ser 1.49 (H) 0.44 - 1.00 mg/dL   Calcium 8.4 (L) 8.9 - 10.3 mg/dL   Total Protein 5.1 (L) 6.5 - 8.1 g/dL   Albumin 2.4 (L) 3.5 - 5.0 g/dL   AST 574 (H) 15 - 41 U/L   ALT 837 (H) 14 - 54 U/L   Alkaline Phosphatase 166 (H) 38 -  126 U/L   Total Bilirubin 0.6 0.3 - 1.2 mg/dL   GFR calc non Af Amer 32 (L) >60 mL/min   GFR calc Af Amer 37 (L) >60 mL/min    Comment: (NOTE) The eGFR has been calculated using the CKD EPI equation. This calculation has not been validated in all clinical situations. eGFR's persistently <60 mL/min signify possible Chronic Kidney Disease.    Anion gap 7 5 - 15  Magnesium     Status: Abnormal   Collection Time: 02/06/17  5:52 AM  Result Value Ref Range   Magnesium 1.5 (L) 1.7 - 2.4 mg/dL    Dg Chest 2  View  Result Date: 02/04/2017 CLINICAL DATA:  Abdominal pain for 2 days. Nausea, vomiting. No chest pain or shortness of breath. EXAM: CHEST  2 VIEW COMPARISON:  None. FINDINGS: The heart size and mediastinal contours are within normal limits. Both lungs are clear. The visualized skeletal structures are unremarkable. IMPRESSION: No active cardiopulmonary disease. Electronically Signed   By: Kathreen Devoid   On: 02/04/2017 19:14   US Abdomen Limited  Result Date: 02/04/2017 CLINICAL DATA:  Initial evaluation for elevated LFTs, abdominal pain. EXAM: ULTRASOUND ABDOMEN LIMITED RIGHT UPPER QUADRANT COMPARISON:  None. FINDINGS: Gallbladder: Layering echogenic material within the gallbladder lumen suspicious for small stones. Gallbladder wall measure within normal limits at 2.4 mm. No free pericholecystic fluid. Sonographic Murphy sign not assessed on this exam. Common bile duct: Prominent measuring up to 9 mm, but equivocal in size for patient age. Liver: No focal lesion identified. Diffusely increased echogenicity, suggestive of steatosis. Portal vein is patent on color Doppler imaging with normal direction of blood flow towards the liver. IMPRESSION: 1. Cholelithiasis without additional sonographic features to suggest acute cholecystitis. 2. Mildly prominent common bile duct measuring up to 9 mm, equivocal/upper limits normal for patient age. 3. Hepatic steatosis. Electronically Signed   By: Jeannine Boga M.D.   On: 02/04/2017 20:36    Review of Systems  Constitutional: Negative.        She is on daily diuretic for her lower extremity edema.  HENT: Negative.   Eyes: Negative.   Respiratory: Negative.   Cardiovascular: Negative.   Gastrointestinal: Positive for abdominal pain, nausea and vomiting. Negative for blood in stool, constipation, diarrhea, heartburn and melena.  Genitourinary: Negative.   Musculoskeletal: Negative.   Skin: Negative.   Neurological: Negative.   Endo/Heme/Allergies:  Negative.   Psychiatric/Behavioral: Negative.    Blood pressure (!) 142/73, pulse 75, temperature 98.6 F (37 C), temperature source Oral, resp. rate 16, height 5' 4" (1.626 m), weight 59 kg (130 lb), SpO2 95 %. Physical Exam  Constitutional: She is oriented to person, place, and time. She appears well-developed and well-nourished. No distress.  HENT:  Head: Normocephalic and atraumatic.  Mouth/Throat: No oropharyngeal exudate.  Eyes: Right eye exhibits no discharge. Left eye exhibits no discharge. No scleral icterus.  Pupils are equal  Neck: Normal range of motion. Neck supple. No JVD present. No tracheal deviation present. No thyromegaly present.  Cardiovascular: Normal rate, regular rhythm, normal heart sounds and intact distal pulses.   No murmur heard. Respiratory: Effort normal and breath sounds normal. No respiratory distress. She has no wheezes. She has no rales. She exhibits no tenderness.  GI: Soft. Bowel sounds are normal. She exhibits no distension and no mass. There is tenderness (tenderness has resolved but it is across the entire upper abdomen when she came in.). There is no rebound and no guarding.  Musculoskeletal:  She exhibits edema (trace).  Lymphadenopathy:    She has no cervical adenopathy.  Neurological: She is alert and oriented to person, place, and time. No cranial nerve deficit.  Skin: Skin is warm and dry. No rash noted. She is not diaphoretic. No erythema. No pallor.  Psychiatric: She has a normal mood and affect. Her behavior is normal. Judgment and thought content normal.    Assessment/Plan: Gallstone pancreatitis with elevated LFT's - ? Choledocholithiasis Hypertension Stage III Kidney disease Hypothyroid CAD with PCI 2001 - no anticoagulants listed - no stress or Echo done in [place] Chronic lower extremity edema  Plan:  She will need to be medically cleared for surgery. If she needs cardiac clearance that should be started now.  If she is pain  free, and LFT's continue to improve we will most likely be able to do her tomorrow.  She saw Dr. Radford Pax here in [place] about 1 year ago, her MI and stent was in [place].   JENNINGS,WILLARD 02/06/2017, 11:01 AM

## 2017-02-06 NOTE — Progress Notes (Signed)
PROGRESS NOTE    Wendy Walters  BPZ:[id number] DOB: [birth year] DOA: 02/04/2017 PCP: Leighton Ruff, MD   Brief Narrative:  83 year old with a history of CAD, hyperlipidemia, hypertension, hypothyroidism, CK D stage III admitted for abdominal pain found to have gallstone pancreatitis.   Assessment & Plan:   Principal Problem:   Gallstone pancreatitis Active Problems:   Essential hypertension, benign   CKD (chronic kidney disease) stage 3, GFR 30-59 ml/min (HCC)   Anemia of chronic disease  Abdominal pain secondary to gallstone pancreatitis -Oral diet as tolerated today -LFTs appears to be improving, GI following -General surgery consulted this morning -Repeat lipase and CMP tomorrow -IV fluids-lactated Ringer's -Pain control  Essential hypertension -Continue Cardizem and lisinopril  CKD stage III -Secondary to fibrillary glomerulonephritis, creatinine is stable  Hypothyroidism -Continue Synthroid  CAD status post PCI in 2001 -Stable  Chronic lower extremity edema -Torsemide on hold given she is getting IV fluids in the setting of pancreatitis    DVT prophylaxis: SCDs Code Status: DO NOT RESUSCITATE Family Communication:  None at bedside Disposition Plan: Maintain inpatient stay  Consultants:   Gastroenterology-[facility]  General surgery  Procedures:   None  Antimicrobials:   None   Subjective: No acute events overnight. States she feels a little better this morning in terms of her pain. Denies nausea vomiting  Objective: Vitals:   02/05/17 1623 02/05/17 2139 02/06/17 0507 02/06/17 0949  BP: (!) 166/82 (!) 159/73 140/60 (!) 142/73  Pulse: 87 93 76 75  Resp: 15 16 16    Temp: 99.5 F (37.5 C) 100.2 F (37.9 C) 98.6 F (37 C)   TempSrc: Oral Oral Oral   SpO2: 93% 93% 95%   Weight:      Height:        Intake/Output Summary (Last 24 hours) at 02/06/17 1030 Last data filed at 02/06/17 1012  Gross per 24 hour  Intake             2935 ml   Output                0 ml  Net             2935 ml   Filed Weights   02/04/17 1719  Weight: 59 kg (130 lb)    Examination:  General exam: Appears calm and comfortable . Dry mucous membranes Respiratory system: Clear to auscultation. Respiratory effort normal. Cardiovascular system: S1 & S2 heard, RRR. No JVD, murmurs, rubs, gallops or clicks. No pedal edema. Gastrointestinal system: Abdomen is nondistended, soft and nontender. No organomegaly or masses felt. Normal bowel sounds heard. Central nervous system: Alert and oriented. No focal neurological deficits. Extremities: Symmetric 5 x 5 power. Skin: No rashes, lesions or ulcers Psychiatry: Judgement and insight appear normal. Mood & affect appropriate.     Data Reviewed:   CBC:  Recent Labs Lab 02/04/17 1745 02/05/17 0600 02/06/17 0552  WBC 7.8 4.1 7.5  NEUTROABS 6.6  --  6.0  HGB 9.1* 8.1* 8.2*  HCT 27.0* 23.6* 23.9*  MCV 89.4 89.4 89.2  PLT 226 192 242   Basic Metabolic Panel:  Recent Labs Lab 02/04/17 1745 02/05/17 0600 02/06/17 0552  NA 135 137 137  K 3.9 4.3 3.8  CL 102 104 104  CO2 24 26 26   GLUCOSE 108* 93 92  BUN 28* 26* 25*  CREATININE 1.48* 1.46* 1.49*  CALCIUM 9.5 8.8* 8.4*  MG  --   --  1.5*   GFR: Estimated Creatinine Clearance:  25.6 mL/min (A) (by C-G formula based on SCr of 1.49 mg/dL (H)). Liver Function Tests:  Recent Labs Lab 02/04/17 1745 02/05/17 0600 02/06/17 0552  AST 627* 1,650* 574*  ALT 429* 1,426* 837*  ALKPHOS 155* 164* 166*  BILITOT 0.9 0.5 0.6  PROT 6.0* 5.3* 5.1*  ALBUMIN 3.1* 2.7* 2.4*    Recent Labs Lab 02/04/17 1745  LIPASE 1,469*   No results for input(s): AMMONIA in the last 168 hours. Coagulation Profile: No results for input(s): INR, PROTIME in the last 168 hours. Cardiac Enzymes: No results for input(s): CKTOTAL, CKMB, CKMBINDEX, TROPONINI in the last 168 hours. BNP (last 3 results) No results for input(s): PROBNP in the last 8760  hours. HbA1C: No results for input(s): HGBA1C in the last 72 hours. CBG: No results for input(s): GLUCAP in the last 168 hours. Lipid Profile: No results for input(s): CHOL, HDL, LDLCALC, TRIG, CHOLHDL, LDLDIRECT in the last 72 hours. Thyroid Function Tests:  Recent Labs  02/05/17 0026  TSH 2.052   Anemia Panel: No results for input(s): VITAMINB12, FOLATE, FERRITIN, TIBC, IRON, RETICCTPCT in the last 72 hours. Sepsis Labs: No results for input(s): PROCALCITON, LATICACIDVEN in the last 168 hours.  No results found for this or any previous visit (from the past 240 hour(s)).       Radiology Studies: Dg Chest 2 View  Result Date: 02/04/2017 CLINICAL DATA:  Abdominal pain for 2 days. Nausea, vomiting. No chest pain or shortness of breath. EXAM: CHEST  2 VIEW COMPARISON:  None. FINDINGS: The heart size and mediastinal contours are within normal limits. Both lungs are clear. The visualized skeletal structures are unremarkable. IMPRESSION: No active cardiopulmonary disease. Electronically Signed   By: Kathreen Devoid   On: 02/04/2017 19:14   US Abdomen Limited  Result Date: 02/04/2017 CLINICAL DATA:  Initial evaluation for elevated LFTs, abdominal pain. EXAM: ULTRASOUND ABDOMEN LIMITED RIGHT UPPER QUADRANT COMPARISON:  None. FINDINGS: Gallbladder: Layering echogenic material within the gallbladder lumen suspicious for small stones. Gallbladder wall measure within normal limits at 2.4 mm. No free pericholecystic fluid. Sonographic Murphy sign not assessed on this exam. Common bile duct: Prominent measuring up to 9 mm, but equivocal in size for patient age. Liver: No focal lesion identified. Diffusely increased echogenicity, suggestive of steatosis. Portal vein is patent on color Doppler imaging with normal direction of blood flow towards the liver. IMPRESSION: 1. Cholelithiasis without additional sonographic features to suggest acute cholecystitis. 2. Mildly prominent common bile duct measuring  up to 9 mm, equivocal/upper limits normal for patient age. 3. Hepatic steatosis. Electronically Signed   By: Jeannine Boga M.D.   On: 02/04/2017 20:36        Scheduled Meds: . diltiazem  240 mg Oral Daily  . Ferrous Fumarate  106 mg of iron Oral Daily  . levothyroxine  88 mcg Oral QAC breakfast  . lisinopril  40 mg Oral Daily  . sodium bicarbonate  650 mg Oral BID   Continuous Infusions: . lactated ringers 75 mL/hr at 02/05/17 1352  . potassium chloride 10 mEq (02/06/17 1005)     LOS: 2 days    Time spent: 25 mins   Shamell Suarez Arsenio Loader, MD [facility] Pager [phone]   If 7PM-7AM, please contact night-coverage [email or web address] Password [email or web address] 02/06/2017, 10:30 AM

## 2017-02-07 ENCOUNTER — Encounter (HOSPITAL_COMMUNITY): Payer: Self-pay

## 2017-02-07 ENCOUNTER — Inpatient Hospital Stay (HOSPITAL_COMMUNITY): Payer: Medicare Other | Admitting: Anesthesiology

## 2017-02-07 ENCOUNTER — Encounter (HOSPITAL_COMMUNITY)
Admission: EM | Disposition: A | Discharge status: Home / Self Care | Payer: Self-pay | Source: Home / Self Care | Attending: Internal Medicine

## 2017-02-07 ENCOUNTER — Inpatient Hospital Stay (HOSPITAL_COMMUNITY): Payer: Medicare Other

## 2017-02-07 DIAGNOSIS — Z419 Encounter for procedure for purposes other than remedying health state, unspecified: Secondary | ICD-10-CM

## 2017-02-07 DIAGNOSIS — D638 Anemia in other chronic diseases classified elsewhere: Secondary | ICD-10-CM

## 2017-02-07 DIAGNOSIS — N183 Chronic kidney disease, stage 3 (moderate): Secondary | ICD-10-CM

## 2017-02-07 DIAGNOSIS — I10 Essential (primary) hypertension: Secondary | ICD-10-CM

## 2017-02-07 HISTORY — PX: CHOLECYSTECTOMY: SHX55

## 2017-02-07 LAB — CBC
HCT: 24.7 % — ABNORMAL LOW (ref 36.0–46.0)
HEMOGLOBIN: 8.4 g/dL — AB (ref 12.0–15.0)
MCH: 30.3 pg (ref 26.0–34.0)
MCHC: 34 g/dL (ref 30.0–36.0)
MCV: 89.2 fL (ref 78.0–100.0)
Platelets: 193 10*3/uL (ref 150–400)
RBC: 2.77 MIL/uL — AB (ref 3.87–5.11)
RDW: 13.4 % (ref 11.5–15.5)
WBC: 8.2 10*3/uL (ref 4.0–10.5)

## 2017-02-07 LAB — COMPREHENSIVE METABOLIC PANEL
ALBUMIN: 2.5 g/dL — AB (ref 3.5–5.0)
ALT: 570 U/L — AB (ref 14–54)
AST: 258 U/L — AB (ref 15–41)
Alkaline Phosphatase: 168 U/L — ABNORMAL HIGH (ref 38–126)
Anion gap: 7 (ref 5–15)
BUN: 24 mg/dL — AB (ref 6–20)
CHLORIDE: 103 mmol/L (ref 101–111)
CO2: 23 mmol/L (ref 22–32)
CREATININE: 1.4 mg/dL — AB (ref 0.44–1.00)
Calcium: 8.3 mg/dL — ABNORMAL LOW (ref 8.9–10.3)
GFR calc Af Amer: 40 mL/min — ABNORMAL LOW (ref >60)
GFR calc non Af Amer: 34 mL/min — ABNORMAL LOW (ref >60)
GLUCOSE: 90 mg/dL (ref 65–99)
POTASSIUM: 4.2 mmol/L (ref 3.5–5.1)
SODIUM: 133 mmol/L — AB (ref 135–145)
TOTAL PROTEIN: 5.2 g/dL — AB (ref 6.5–8.1)
Total Bilirubin: 0.7 mg/dL (ref 0.3–1.2)

## 2017-02-07 LAB — SURGICAL PCR SCREEN
MRSA, PCR: NEGATIVE
Staphylococcus aureus: NEGATIVE

## 2017-02-07 LAB — ABO/RH: ABO/RH(D): O POS

## 2017-02-07 LAB — LIPASE, BLOOD: LIPASE: 60 U/L — AB (ref 11–51)

## 2017-02-07 LAB — TYPE AND SCREEN
ABO/RH(D): O POS
ANTIBODY SCREEN: NEGATIVE

## 2017-02-07 SURGERY — LAPAROSCOPIC CHOLECYSTECTOMY WITH INTRAOPERATIVE CHOLANGIOGRAM
Anesthesia: General

## 2017-02-07 MED ORDER — PROPOFOL 10 MG/ML IV BOLUS
INTRAVENOUS | Status: AC
  Filled 2017-02-07: qty 20

## 2017-02-07 MED ORDER — ROCURONIUM BROMIDE 10 MG/ML (PF) SYRINGE
PREFILLED_SYRINGE | INTRAVENOUS | Status: DC | PRN
  Administered 2017-02-07: 30 mg via INTRAVENOUS

## 2017-02-07 MED ORDER — HYDROCODONE-ACETAMINOPHEN 5-325 MG PO TABS: 1.0000 | ORAL_TABLET | ORAL | Status: DC | PRN

## 2017-02-07 MED ORDER — SUCCINYLCHOLINE CHLORIDE 200 MG/10ML IV SOSY
PREFILLED_SYRINGE | INTRAVENOUS | Status: DC | PRN
  Administered 2017-02-07: 80 mg via INTRAVENOUS

## 2017-02-07 MED ORDER — DEXAMETHASONE SODIUM PHOSPHATE 10 MG/ML IJ SOLN
INTRAMUSCULAR | Status: AC
  Filled 2017-02-07: qty 1

## 2017-02-07 MED ORDER — SUGAMMADEX SODIUM 200 MG/2ML IV SOLN
INTRAVENOUS | Status: AC
  Filled 2017-02-07: qty 2

## 2017-02-07 MED ORDER — ROCURONIUM BROMIDE 50 MG/5ML IV SOSY
PREFILLED_SYRINGE | INTRAVENOUS | Status: AC
  Filled 2017-02-07: qty 5

## 2017-02-07 MED ORDER — LACTATED RINGERS IV SOLN
INTRAVENOUS | Status: DC
  Administered 2017-02-07: 1000 mL via INTRAVENOUS
  Administered 2017-02-07: 15:00:00 via INTRAVENOUS

## 2017-02-07 MED ORDER — IOPAMIDOL (ISOVUE-300) INJECTION 61%
INTRAVENOUS | Status: AC
  Filled 2017-02-07: qty 50

## 2017-02-07 MED ORDER — FENTANYL CITRATE (PF) 100 MCG/2ML IJ SOLN
INTRAMUSCULAR | Status: AC
  Filled 2017-02-07: qty 2

## 2017-02-07 MED ORDER — ONDANSETRON HCL 4 MG/2ML IJ SOLN
INTRAMUSCULAR | Status: AC
  Filled 2017-02-07: qty 2

## 2017-02-07 MED ORDER — LACTATED RINGERS IR SOLN
Status: DC | PRN
  Administered 2017-02-07: 1000 mL

## 2017-02-07 MED ORDER — BUPIVACAINE-EPINEPHRINE 0.5% -1:200000 IJ SOLN
INTRAMUSCULAR | Status: DC | PRN
Start: 2017-02-07 — End: 2017-02-07
  Administered 2017-02-07: 20 mL

## 2017-02-07 MED ORDER — FENTANYL CITRATE (PF) 100 MCG/2ML IJ SOLN
INTRAMUSCULAR | Status: DC | PRN
  Administered 2017-02-07 (×4): 25 ug via INTRAVENOUS

## 2017-02-07 MED ORDER — FENTANYL CITRATE (PF) 100 MCG/2ML IJ SOLN
50.0000 ug | INTRAMUSCULAR | Status: DC | PRN
  Administered 2017-02-07 (×2): 50 ug via INTRAVENOUS

## 2017-02-07 MED ORDER — CEFAZOLIN (ANCEF) 1 G IV SOLR: 1.0000 g | INTRAVENOUS | Status: DC

## 2017-02-07 MED ORDER — LIDOCAINE 2% (20 MG/ML) 5 ML SYRINGE
INTRAMUSCULAR | Status: DC | PRN
  Administered 2017-02-07: 100 mg via INTRAVENOUS

## 2017-02-07 MED ORDER — FENTANYL CITRATE (PF) 100 MCG/2ML IJ SOLN
INTRAMUSCULAR | Status: AC
  Filled 2017-02-07: qty 4

## 2017-02-07 MED ORDER — LIDOCAINE 2% (20 MG/ML) 5 ML SYRINGE
INTRAMUSCULAR | Status: AC
  Filled 2017-02-07: qty 5

## 2017-02-07 MED ORDER — FENTANYL CITRATE (PF) 100 MCG/2ML IJ SOLN
INTRAMUSCULAR | Status: AC
  Administered 2017-02-07: 50 ug via INTRAVENOUS
  Filled 2017-02-07: qty 2

## 2017-02-07 MED ORDER — SUCCINYLCHOLINE CHLORIDE 200 MG/10ML IV SOSY
PREFILLED_SYRINGE | INTRAVENOUS | Status: AC
  Filled 2017-02-07: qty 10

## 2017-02-07 MED ORDER — MEPERIDINE HCL 50 MG/ML IJ SOLN: 6.2500 mg | INTRAMUSCULAR | Status: DC | PRN

## 2017-02-07 MED ORDER — PROPOFOL 10 MG/ML IV BOLUS
INTRAVENOUS | Status: DC | PRN
  Administered 2017-02-07: 100 mg via INTRAVENOUS

## 2017-02-07 MED ORDER — FENTANYL CITRATE (PF) 100 MCG/2ML IJ SOLN: 25.0000 ug | INTRAMUSCULAR | Status: DC | PRN

## 2017-02-07 MED ORDER — IOPAMIDOL (ISOVUE-300) INJECTION 61%
INTRAVENOUS | Status: DC | PRN
  Administered 2017-02-07: 20 mL

## 2017-02-07 MED ORDER — BUPIVACAINE-EPINEPHRINE (PF) 0.5% -1:200000 IJ SOLN
INTRAMUSCULAR | Status: AC
  Filled 2017-02-07: qty 30

## 2017-02-07 MED ORDER — SUGAMMADEX SODIUM 200 MG/2ML IV SOLN
INTRAVENOUS | Status: DC | PRN
  Administered 2017-02-07: 200 mg via INTRAVENOUS

## 2017-02-07 MED ORDER — CEFAZOLIN SODIUM-DEXTROSE 2-4 GM/100ML-% IV SOLN: 2.0000 g | INTRAVENOUS | Status: DC

## 2017-02-07 MED ORDER — DEXAMETHASONE SODIUM PHOSPHATE 10 MG/ML IJ SOLN
INTRAMUSCULAR | Status: DC | PRN
  Administered 2017-02-07: 10 mg via INTRAVENOUS

## 2017-02-07 MED ORDER — ONDANSETRON HCL 4 MG/2ML IJ SOLN
INTRAMUSCULAR | Status: DC | PRN
  Administered 2017-02-07: 4 mg via INTRAVENOUS

## 2017-02-07 MED ORDER — CEFAZOLIN SODIUM-DEXTROSE 2-4 GM/100ML-% IV SOLN
INTRAVENOUS | Status: AC
  Filled 2017-02-07: qty 100

## 2017-02-07 MED ORDER — KCL IN DEXTROSE-NACL 20-5-0.45 MEQ/L-%-% IV SOLN
INTRAVENOUS | Status: DC
  Administered 2017-02-07: 19:00:00 via INTRAVENOUS
  Filled 2017-02-07: qty 1000

## 2017-02-07 SURGICAL SUPPLY — 30 items
APPLIER CLIP ROT 10 11.4 M/L (STAPLE) ×3
CABLE HIGH FREQUENCY MONO STRZ (ELECTRODE) ×3 IMPLANT
CHLORAPREP W/TINT 26ML (MISCELLANEOUS) ×3 IMPLANT
CLIP APPLIE ROT 10 11.4 M/L (STAPLE) ×1 IMPLANT
CLOSURE WOUND 1/2 X4 (GAUZE/BANDAGES/DRESSINGS) ×1
COVER MAYO STAND STRL (DRAPES) ×3 IMPLANT
COVER SURGICAL LIGHT HANDLE (MISCELLANEOUS) ×3 IMPLANT
DECANTER SPIKE VIAL GLASS SM (MISCELLANEOUS) ×3 IMPLANT
DRAPE C-ARM 42X120 X-RAY (DRAPES) ×3 IMPLANT
ELECT REM PT RETURN 15FT ADLT (MISCELLANEOUS) ×3 IMPLANT
GAUZE SPONGE 2X2 8PLY STRL LF (GAUZE/BANDAGES/DRESSINGS) ×1 IMPLANT
GLOVE SURG ORTHO 8.0 STRL STRW (GLOVE) ×24 IMPLANT
GOWN STRL REUS W/TWL XL LVL3 (GOWN DISPOSABLE) ×12 IMPLANT
HEMOSTAT SURGICEL 4X8 (HEMOSTASIS) IMPLANT
KIT BASIN OR (CUSTOM PROCEDURE TRAY) ×3 IMPLANT
POUCH SPECIMEN RETRIEVAL 10MM (ENDOMECHANICALS) ×3 IMPLANT
SCISSORS LAP 5X35 DISP (ENDOMECHANICALS) ×3 IMPLANT
SET CHOLANGIOGRAPH MIX (MISCELLANEOUS) ×3 IMPLANT
SET IRRIG TUBING LAPAROSCOPIC (IRRIGATION / IRRIGATOR) ×3 IMPLANT
SLEEVE XCEL OPT CAN 5 100 (ENDOMECHANICALS) ×3 IMPLANT
SPONGE GAUZE 2X2 STER 10/PKG (GAUZE/BANDAGES/DRESSINGS) ×2
STRIP CLOSURE SKIN 1/2X4 (GAUZE/BANDAGES/DRESSINGS) ×2 IMPLANT
SUT MNCRL AB 4-0 PS2 18 (SUTURE) ×3 IMPLANT
TOWEL OR 17X26 10 PK STRL BLUE (TOWEL DISPOSABLE) ×3 IMPLANT
TOWEL OR NON WOVEN STRL DISP B (DISPOSABLE) ×3 IMPLANT
TRAY LAPAROSCOPIC (CUSTOM PROCEDURE TRAY) ×3 IMPLANT
TROCAR BLADELESS OPT 5 100 (ENDOMECHANICALS) ×3 IMPLANT
TROCAR XCEL BLUNT TIP 100MML (ENDOMECHANICALS) ×3 IMPLANT
TROCAR XCEL NON-BLD 11X100MML (ENDOMECHANICALS) ×3 IMPLANT
TUBING INSUF HEATED (TUBING) ×3 IMPLANT

## 2017-02-07 NOTE — Anesthesia Procedure Notes (Signed)
Procedure Name: Intubation Date/Time: 02/07/2017 1:23 PM Performed by: Lind Covert Pre-anesthesia Checklist: Patient identified, Emergency Drugs available, Suction available, Patient being monitored and Timeout performed Patient Re-evaluated:Patient Re-evaluated prior to induction Oxygen Delivery Method: Circle system utilized Preoxygenation: Pre-oxygenation with 100% oxygen Induction Type: IV induction Laryngoscope Size: Mac and 3 Grade View: Grade I Tube type: Oral Tube size: 7.0 mm Number of attempts: 1 Airway Equipment and Method: Stylet Placement Confirmation: ETT inserted through vocal cords under direct vision,  positive ETCO2 and breath sounds checked- equal and bilateral Secured at: 21 cm Tube secured with: Tape Dental Injury: Teeth and Oropharynx as per pre-operative assessment

## 2017-02-07 NOTE — Transfer of Care (Signed)
Immediate Anesthesia Transfer of Care Note  Patient: Wendy Walters  Procedure(s) Performed: LAPAROSCOPIC CHOLECYSTECTOMY WITH INTRAOPERATIVE CHOLANGIOGRAM (N/A )  Patient Location: PACU  Anesthesia Type:General  Level of Consciousness: sedated  Airway & Oxygen Therapy: Patient Spontanous Breathing and Patient connected to face mask oxygen  Post-op Assessment: Report given to RN and Post -op Vital signs reviewed and stable  Post vital signs: Reviewed and stable  Last Vitals:  Vitals:   02/07/17 1227 02/07/17 1250  BP:    Pulse: 77 71  Resp:    Temp:    SpO2: 93% 97%    Last Pain:  Vitals:   02/07/17 1250  TempSrc:   PainSc: 0-No pain      Patients Stated Pain Goal: 2 (12/75/17 0017)  Complications: No apparent anesthesia complications

## 2017-02-07 NOTE — Care Management Important Message (Signed)
Important Message  Patient Details  Name: Wendy Walters MRN: [id number] Date of Birth: [birth year]   Medicare Important Message Given:  Yes    [name] 02/07/2017, 11:40 AMImportant Message  Patient Details  Name: Wendy Walters MRN: [id number] Date of Birth: 1932-10-13   Medicare Important Message Given:  Yes    [name] 02/07/2017, 11:40 AM

## 2017-02-07 NOTE — Anesthesia Postprocedure Evaluation (Signed)
Anesthesia Post Note  Patient: Software engineer  Procedure(s) Performed: LAPAROSCOPIC CHOLECYSTECTOMY WITH INTRAOPERATIVE CHOLANGIOGRAM (N/A )     Patient location during evaluation: PACU Anesthesia Type: General Level of consciousness: sedated Pain management: pain level controlled Vital Signs Assessment: post-procedure vital signs reviewed and stable Respiratory status: spontaneous breathing and respiratory function stable Cardiovascular status: stable Postop Assessment: no apparent nausea or vomiting Anesthetic complications: no    Last Vitals:  Vitals:   02/07/17 1605 02/07/17 1800  BP: (!) 147/69 (!) 148/67  Pulse:    Resp: 12   Temp: 36.7 C 36.4 C  SpO2: 94% 97%    Last Pain:  Vitals:   02/07/17 1800  TempSrc: Oral  PainSc:                  Lindey Renzulli DANIEL

## 2017-02-07 NOTE — Progress Notes (Signed)
Progress Note    Wendy Walters  NTI:[id number] DOB: 26-May-1932  DOA: 02/04/2017 PCP: Leighton Ruff, MD    Brief Narrative:   Chief complaint: Follow-up gallstone pancreatitis  Medical records reviewed and are as summarized below:  Wendy Walters is an 83 y.o. female with a history of CAD, hyperlipidemia, hypertension, hypothyroidism, CKD stage III admitted 02/04/17 for abdominal pain, and found to have gallstone pancreatitis.  Assessment/Plan:   Principal Problem: Abdominal pain secondary to gallstone pancreatitis Evaluated by both GI and general surgery. LFTs/lipase have markedly decreased and it is likely that she passed a stone. Likely will proceed with cholecystectomy per general surgery evaluation 02/06/17.  Active problems: Essential hypertension Continue Cardizem and lisinopril.  CKD stage III Secondary to fibrillary glomerulonephritis, creatinine is stable.  Hypothyroidism Continue Synthroid.  CAD status post PCI in 2001 Stable.  Chronic lower extremity edema Torsemide on hold given she is getting IV fluids in the setting of pancreatitis.  Family Communication/Anticipated D/C date and plan/Code Status   DVT prophylaxis: SCDs ordered. Code Status: DO NOT RESUSCITATE.  Family Communication: Daughter updated at bedside. Disposition Plan: Home in 24-48 hours depending on postoperative course.   Medical Consultants:    None.   Anti-Infectives:    None  Subjective:   No complaints other than some back pain which she attributes to sleeping in a hospital bed. No current complaints of abdominal pain, nausea or vomiting.  Objective:    Vitals:   02/06/17 0949 02/06/17 1340 02/06/17 2136 02/07/17 0515  BP: (!) 142/73 135/63 137/75 (!) 143/72  Pulse: 75 83 82 78  Resp:  18 20 18   Temp:  98.5 F (36.9 C) 99.3 F (37.4 C) 98.6 F (37 C)  TempSrc:  Oral Oral Oral  SpO2:  93% 95% 93%  Weight:      Height:        Intake/Output Summary  (Last 24 hours) at 02/07/17 0748 Last data filed at 02/07/17 0600  Gross per 24 hour  Intake             2550 ml  Output                0 ml  Net             2550 ml   Filed Weights   02/04/17 1719  Weight: 59 kg (130 lb)    Exam: General: No acute distress. Cardiovascular: Heart sounds show a regular rate, and rhythm. No gallops or rubs. 1/VI murmur. No JVD. Lungs: Clear to auscultation bilaterally with good air movement. No rales, rhonchi or wheezes. Abdomen: Soft, nontender, nondistended with normal active bowel sounds. No masses. No hepatosplenomegaly. Neurological: Alert and oriented 3. Moves all extremities 4 with equal strength. Cranial nerves II through XII grossly intact. Skin: Warm and dry. No rashes or lesions. Extremities: No clubbing or cyanosis. 2+ edema. Pedal pulses 2+. Psychiatric: Mood and affect are normal. Insight and judgment are normal.   Data Reviewed:   I have personally reviewed following labs and imaging studies:  Labs: Labs show the following:   Basic Metabolic Panel:  Recent Labs Lab 02/04/17 1745 02/05/17 0600 02/06/17 0552 02/07/17 0508  NA 135 137 137 133*  K 3.9 4.3 3.8 4.2  CL 102 104 104 103  CO2 24 26 26 23   GLUCOSE 108* 93 92 90  BUN 28* 26* 25* 24*  CREATININE 1.48* 1.46* 1.49* 1.40*  CALCIUM 9.5 8.8* 8.4* 8.3*  MG  --   --  1.5*  --    GFR Estimated Creatinine Clearance: 27.2 mL/min (A) (by C-G formula based on SCr of 1.4 mg/dL (H)). Liver Function Tests:  Recent Labs Lab 02/04/17 1745 02/05/17 0600 02/06/17 0552 02/07/17 0508  AST 627* 1,650* 574* 258*  ALT 429* 1,426* 837* 570*  ALKPHOS 155* 164* 166* 168*  BILITOT 0.9 0.5 0.6 0.7  PROT 6.0* 5.3* 5.1* 5.2*  ALBUMIN 3.1* 2.7* 2.4* 2.5*    Recent Labs Lab 02/04/17 1745 02/07/17 0508  LIPASE 1,469* 60*   CBC:  Recent Labs Lab 02/04/17 1745 02/05/17 0600 02/06/17 0552 02/07/17 0508  WBC 7.8 4.1 7.5 8.2  NEUTROABS 6.6  --  6.0  --   HGB 9.1* 8.1*  8.2* 8.4*  HCT 27.0* 23.6* 23.9* 24.7*  MCV 89.4 89.4 89.2 89.2  PLT 226 192 183 193     Recent Labs  02/05/17 0026  TSH 2.052    Microbiology No results found for this or any previous visit (from the past 240 hour(s)).  Procedures and diagnostic studies:  No results found.  Medications:   . diltiazem  240 mg Oral Daily  . Ferrous Fumarate  106 mg of iron Oral Daily  . levothyroxine  88 mcg Oral QAC breakfast  . lisinopril  40 mg Oral Daily  . sodium bicarbonate  650 mg Oral BID   Continuous Infusions: . lactated ringers 75 mL/hr at 02/06/17 1314     LOS: 3 days   Haleem Hanner  [facility] Pager [phone]. If unable to reach me by pager, please call my cell phone at [phone].  *Please refer to [email or web address], password [email or web address] to get updated schedule on who will round on this patient, as hospitalists switch teams weekly. If 7PM-7AM, please contact night-coverage at [email or web address], password [email or web address] for any overnight needs.  02/07/2017, 7:48 AM

## 2017-02-07 NOTE — Op Note (Signed)
Procedure Note  Pre-operative Diagnosis:  Biliary pancreatitis, chronic cholecystitis, gallbladder sludge  Post-operative Diagnosis:  same  Surgeon:  Earnstine Regal, MD, FACS  Assistant:  None   Procedure:  Laparoscopic cholecystectomy with intra-operative cholangiography  Anesthesia:  General  Estimated Blood Loss:  minimal  Drains: none         Specimen: Gallbladder to pathology  Indications:  Patient is an 83 yo WF admitted with biliary pancreatitis.  Now for lap cholecystectomy and IOC.  Procedure Details:  The patient was seen in the pre-op holding area. The risks, benefits, complications, treatment options, and expected outcomes were previously discussed with the patient. The patient agreed with the proposed plan and has signed the informed consent form.  The patient was brought to the Operating Room, identified as Wendy Walters and the procedure verified as laparoscopic cholecystectomy with intraoperative cholangiography. A "time out" was completed and the above information confirmed.  The patient was placed in the supine position. Following induction of general anesthesia, the abdomen was prepped and draped in the usual aseptic fashion.  An incision was made in the skin near the umbilicus. The midline fascia was incised and the peritoneal cavity was entered and a Hasson canula was introduced under direct vision.  The Hasson canula was secured with a 0-Vicryl pursestring suture. Pneumoperitoneum was established with carbon dioxide. Additional trocars were introduced under direct vision along the right costal margin in the midline, mid-clavicular line, and anterior axillary line.   The gallbladder was identified and the fundus grasped and retracted cephalad. Adhesions were taken down bluntly and the electrocautery was utilized as needed, taking care not to injure any adjacent structures. The infundibulum was grasped and retracted laterally, exposing the peritoneum overlying the  triangle of Calot. The peritoneum was incised and structures exposed with blunt dissection. The cystic duct was clearly identified, bluntly dissected circumferentially, and clipped at the neck of the gallbladder.  An incision was made in the cystic duct and the cholangiogram catheter introduced. The catheter was secured using an ligaclip.  Real-time cholangiography was performed using C-arm fluoroscopy.  There was rapid filling of a normal caliber common bile duct.  There was reflux of contrast into the left and right hepatic ductal systems.  There was flow distally into the duodenum.  There did appear to be a partial obstruction with narrowing of the distal common bile duct.  No stones were identified in the duct.  The catheter was removed from the peritoneal cavity.  The cystic duct was then ligated with surgical clips and divided. The cystic artery was identified, dissected circumferentially, ligated with ligaclips, and divided.  The gallbladder was dissected away from the liver bed using the electrocautery for hemostasis. The gallbladder was completely removed from the liver and placed into an endocatch bag. The gallbladder was removed in the endocatch bag through the umbilical port site and submitted to pathology for review.  The right upper quadrant was irrigated and the gallbladder bed was inspected. Hemostasis was achieved with the electrocautery.  Pneumoperitoneum was released after viewing removal of the trocars with good hemostasis noted. The umbilical wound was irrigated and the fascia was then closed with the pursestring suture.  Local anesthetic was infiltrated at all port sites. The skin incisions were closed with 4-0 Monocril subcuticular sutures and steri-strips and dressings were applied.  Instrument, sponge, and needle counts were correct at the conclusion of the case.  The patient was awakened from anesthesia and brought to the recovery room in stable condition.  The patient tolerated  the procedure well.   Earnstine Regal, MD, [facility] Office: [phone]

## 2017-02-07 NOTE — Anesthesia Preprocedure Evaluation (Addendum)
Anesthesia Evaluation  Patient identified by MRN, date of birth, ID band Patient awake    Reviewed: Allergy & Precautions, NPO status , Patient's Chart, lab work & pertinent test results  Airway Mallampati: II  TM Distance: >3 FB Neck ROM: Full    Dental no notable dental hx. (+) Edentulous Upper, Lower Dentures, Partial Upper, Poor Dentition, Missing,    Pulmonary neg pulmonary ROS, former smoker,    Pulmonary exam normal breath sounds clear to auscultation       Cardiovascular hypertension, Pt. on medications + CAD and + Peripheral Vascular Disease  negative cardio ROS Normal cardiovascular exam Rhythm:Regular Rate:Normal     Neuro/Psych negative neurological ROS  negative psych ROS   GI/Hepatic negative GI ROS, Neg liver ROS,   Endo/Other  negative endocrine ROSHypothyroidism   Renal/GU CRFRenal diseasenegative Renal ROS  negative genitourinary   Musculoskeletal negative musculoskeletal ROS (+) Arthritis , Osteoarthritis,    Abdominal   Peds negative pediatric ROS (+)  Hematology negative hematology ROS (+) anemia ,   Anesthesia Other Findings Coronary artery disease    s/p PCI 2001 of lad in setting of AWMI    Reproductive/Obstetrics negative OB ROS                           Anesthesia Physical Anesthesia Plan  ASA: III  Anesthesia Plan: General   Post-op Pain Management:    Induction: Intravenous  PONV Risk Score and Plan: 3 and Ondansetron, Dexamethasone and Treatment may vary due to age or medical condition  Airway Management Planned: Oral ETT  Additional Equipment:   Intra-op Plan:   Post-operative Plan: Extubation in OR  Informed Consent:   Plan Discussed with:   Anesthesia Plan Comments: (  )        Anesthesia Quick Evaluation

## 2017-02-07 NOTE — Progress Notes (Signed)
Assessment Principal Problem:   Gallstone pancreatitis-improving clinically and chemically Active Problems:   Essential hypertension, benign   CKD (chronic kidney disease) stage 3, GFR 30-59 ml/min (HCC)   Anemia of chronic disease   Plan:  Laparoscopic possible open cholecystectomy with IOC. Type and Screen.  Will discuss timing with Dr. Harlow Asa.  I have explained the procedure, risks, and aftercare of cholecystectomy.  Risks include but are not limited to bleeding, infection, wound problems, anesthesia, diarrhea, bile leak, injury to common bile duct/liver/intestine.  She seems to understand and agrees to proceed.    LOS: 3 days        Chief Complaint/Subjective: She denies abdominal pain.  Having some back pain from lying in bed.  Currently up in chair.  She reports that she walks 3 miles a day, 6 days a week at home.  Objective: Vital signs in last 24 hours: Temp:  [98.5 F (36.9 C)-99.3 F (37.4 C)] 98.6 F (37 C) (10/04 0515) Pulse Rate:  [75-83] 78 (10/04 0515) Resp:  [18-20] 18 (10/04 0515) BP: (135-143)/(63-75) 143/72 (10/04 0515) SpO2:  [93 %-95 %] 93 % (10/04 0515) Last BM Date: 02/04/17  Intake/Output from previous day: 10/03 0701 - 10/04 0700 In: 2550 [P.O.:600; I.V.:1800; IV Piggyback:150] Out: -  Intake/Output this shift: No intake/output data recorded.  PE: General- In NAD.  Awake and alert. Abdomen-soft, not tender  Lab Results:   Recent Labs  02/06/17 0552 02/07/17 0508  WBC 7.5 8.2  HGB 8.2* 8.4*  HCT 23.9* 24.7*  PLT 183 193   BMET  Recent Labs  02/06/17 0552 02/07/17 0508  NA 137 133*  K 3.8 4.2  CL 104 103  CO2 26 23  GLUCOSE 92 90  BUN 25* 24*  CREATININE 1.49* 1.40*  CALCIUM 8.4* 8.3*   PT/INR No results for input(s): LABPROT, INR in the last 72 hours. Comprehensive Metabolic Panel:    Component Value Date/Time   NA 133 (L) 02/07/2017 0508   NA 137 02/06/2017 0552   NA 141 08/21/2016 1046   K 4.2 02/07/2017 0508   K  3.8 02/06/2017 0552   CL 103 02/07/2017 0508   CL 104 02/06/2017 0552   CO2 23 02/07/2017 0508   CO2 26 02/06/2017 0552   BUN 24 (H) 02/07/2017 0508   BUN 25 (H) 02/06/2017 0552   BUN 49 (H) 08/21/2016 1046   CREATININE 1.40 (H) 02/07/2017 0508   CREATININE 1.49 (H) 02/06/2017 0552   CREATININE 1.72 (H) 08/22/2015 1122   CREATININE 1.29 (H) 02/23/2015 0913   GLUCOSE 90 02/07/2017 0508   GLUCOSE 92 02/06/2017 0552   CALCIUM 8.3 (L) 02/07/2017 0508   CALCIUM 8.4 (L) 02/06/2017 0552   AST 258 (H) 02/07/2017 0508   AST 574 (H) 02/06/2017 0552   ALT 570 (H) 02/07/2017 0508   ALT 837 (H) 02/06/2017 0552   ALKPHOS 168 (H) 02/07/2017 0508   ALKPHOS 166 (H) 02/06/2017 0552   BILITOT 0.7 02/07/2017 0508   BILITOT 0.6 02/06/2017 0552   PROT 5.2 (L) 02/07/2017 0508   PROT 5.1 (L) 02/06/2017 0552   ALBUMIN 2.5 (L) 02/07/2017 0508   ALBUMIN 2.4 (L) 02/06/2017 0552     Studies/Results: No results found.  Anti-infectives: Anti-infectives    None       Wendy Walters 02/07/2017

## 2017-02-08 DIAGNOSIS — K851 Biliary acute pancreatitis without necrosis or infection: Principal | ICD-10-CM

## 2017-02-08 LAB — COMPREHENSIVE METABOLIC PANEL
ALT: 581 U/L — ABNORMAL HIGH (ref 14–54)
ANION GAP: 9 (ref 5–15)
AST: 484 U/L — ABNORMAL HIGH (ref 15–41)
Albumin: 2.4 g/dL — ABNORMAL LOW (ref 3.5–5.0)
Alkaline Phosphatase: 392 U/L — ABNORMAL HIGH (ref 38–126)
BUN: 27 mg/dL — ABNORMAL HIGH (ref 6–20)
CALCIUM: 8.4 mg/dL — AB (ref 8.9–10.3)
CHLORIDE: 103 mmol/L (ref 101–111)
CO2: 23 mmol/L (ref 22–32)
Creatinine, Ser: 1.49 mg/dL — ABNORMAL HIGH (ref 0.44–1.00)
GFR, EST AFRICAN AMERICAN: 37 mL/min — AB (ref >60)
GFR, EST NON AFRICAN AMERICAN: 32 mL/min — AB (ref >60)
Glucose, Bld: 146 mg/dL — ABNORMAL HIGH (ref 65–99)
Potassium: 4.6 mmol/L (ref 3.5–5.1)
Sodium: 135 mmol/L (ref 135–145)
Total Bilirubin: 0.6 mg/dL (ref 0.3–1.2)
Total Protein: 5.7 g/dL — ABNORMAL LOW (ref 6.5–8.1)

## 2017-02-08 MED ORDER — ACETAMINOPHEN 650 MG RE SUPP: 650.0000 mg | Freq: Four times a day (QID) | RECTAL | Status: DC | PRN

## 2017-02-08 MED ORDER — ONDANSETRON HCL 4 MG/2ML IJ SOLN: 4.0000 mg | Freq: Four times a day (QID) | INTRAMUSCULAR | Status: DC | PRN

## 2017-02-08 MED ORDER — LISINOPRIL 40 MG PO TABS: 40.0000 mg | ORAL_TABLET | Freq: Two times a day (BID) | ORAL | 0 refills | Status: DC

## 2017-02-08 MED ORDER — ENOXAPARIN SODIUM 30 MG/0.3ML ~~LOC~~ SOLN: 30.0000 mg | SUBCUTANEOUS | Status: DC

## 2017-02-08 MED ORDER — LISINOPRIL 40 MG PO TABS: 40.0000 mg | ORAL_TABLET | Freq: Every day | ORAL | 0 refills | Status: DC

## 2017-02-08 MED ORDER — GI COCKTAIL ~~LOC~~
30.0000 mL | Freq: Once | ORAL | Status: AC
  Administered 2017-02-08: 30 mL via ORAL
  Filled 2017-02-08: qty 30

## 2017-02-08 MED ORDER — ACETAMINOPHEN 325 MG PO TABS: 650.0000 mg | ORAL_TABLET | Freq: Four times a day (QID) | ORAL | Status: DC | PRN

## 2017-02-08 MED ORDER — ONDANSETRON 4 MG PO TBDP: 4.0000 mg | ORAL_TABLET | Freq: Four times a day (QID) | ORAL | Status: DC | PRN

## 2017-02-08 MED ORDER — FUROSEMIDE 40 MG PO TABS
60.0000 mg | ORAL_TABLET | Freq: Every day | ORAL | Status: DC
  Administered 2017-02-08: 60 mg via ORAL
  Filled 2017-02-08: qty 1

## 2017-02-08 NOTE — Discharge Instructions (Signed)
Please arrive at least 30 min before your appointment to complete your check in paperwork.  If you are unable to arrive 30 min prior to your appointment time we may have to cancel or reschedule you. ° °LAPAROSCOPIC SURGERY: POST OP INSTRUCTIONS  °1. DIET: Follow a light bland diet the first 24 hours after arrival home, such as soup, liquids, crackers, etc. Be sure to include lots of fluids daily. Avoid fast food or heavy meals as your are more likely to get nauseated. Eat a low fat the next few days after surgery.  °2. Take your usually prescribed home medications unless otherwise directed. °3. PAIN CONTROL:  °1. Pain is best controlled by a usual combination of three different methods TOGETHER:  °1. Ice/Heat °2. Over the counter pain medication °3. Prescription pain medication °2. Most patients will experience some swelling and bruising around the incisions. Ice packs or heating pads (30-60 minutes up to 6 times a day) will help. Use ice for the first few days to help decrease swelling and bruising, then switch to heat to help relax tight/sore spots and speed recovery. Some people prefer to use ice alone, heat alone, alternating between ice & heat. Experiment to what works for you. Swelling and bruising can take several weeks to resolve.  °3. It is helpful to take an over-the-counter pain medication regularly for the first few weeks. Choose one of the following that works best for you:  °1. Naproxen (Aleve, etc) Two 220mg tabs twice a day °2. Ibuprofen (Advil, etc) Three 200mg tabs four times a day (every meal & bedtime) °3. Acetaminophen (Tylenol, etc) 500-650mg four times a day (every meal & bedtime) °4. A prescription for pain medication (such as oxycodone, hydrocodone, etc) should be given to you upon discharge. Take your pain medication as prescribed.  °1. If you are having problems/concerns with the prescription medicine (does not control pain, nausea, vomiting, rash, itching, etc), please call us [phone]  [phone] to see if we need to switch you to a different pain medicine that will work better for you and/or control your side effect better. °2. If you need a refill on your pain medication, please contact your pharmacy. They will contact our office to request authorization. Prescriptions will not be filled after 5 pm or on week-ends. °4. Avoid getting constipated. Between the surgery and the pain medications, it is common to experience some constipation. Increasing fluid intake and taking a fiber supplement (such as Metamucil, Citrucel, FiberCon, MiraLax, etc) 1-2 times a day regularly will usually help prevent this problem from occurring. A mild laxative (prune juice, Milk of Magnesia, MiraLax, etc) should be taken according to package directions if there are no bowel movements after 48 hours.  °5. Watch out for diarrhea. If you have many loose bowel movements, simplify your diet to bland foods & liquids for a few days. Stop any stool softeners and decrease your fiber supplement. Switching to mild anti-diarrheal medications (Kayopectate, Pepto Bismol) can help. If this worsens or does not improve, please call us. °6. Wash / shower every day. You may shower over the dressings as they are waterproof. Continue to shower over incision(s) after the dressing is off. °7. Remove your waterproof bandages 5 days after surgery. You may leave the incision open to air. You may replace a dressing/Band-Aid to cover the incision for comfort if you wish.  °8. ACTIVITIES as tolerated:  °1. You may resume regular (light) daily activities beginning the next day--such as daily self-care, walking, climbing stairs--gradually   increasing activities as tolerated. If you can walk 30 minutes without difficulty, it is safe to try more intense activity such as jogging, treadmill, bicycling, low-impact aerobics, swimming, etc. °2. Save the most intensive and strenuous activity for last such as sit-ups, heavy lifting, contact sports, etc Refrain  from any heavy lifting or straining until you are off narcotics for pain control.  °3. DO NOT PUSH THROUGH PAIN. Let pain be your guide: If it hurts to do something, don't do it. Pain is your body warning you to avoid that activity for another week until the pain goes down. °4. You may drive when you are no longer taking prescription pain medication, you can comfortably wear a seatbelt, and you can safely maneuver your car and apply brakes. °5. You may have sexual intercourse when it is comfortable.  °9. FOLLOW UP in our office  °1. Please call [facility] at [phone] to set up an appointment to see your surgeon in the office for a follow-up appointment approximately 2-3 weeks after your surgery. °2. Make sure that you call for this appointment the day you arrive home to insure a convenient appointment time. °     10. IF YOU HAVE DISABILITY OR FAMILY LEAVE FORMS, BRING THEM TO THE               OFFICE FOR PROCESSING.  ° °WHEN TO CALL US [phone]:  °1. Poor pain control °2. Reactions / problems with new medications (rash/itching, nausea, etc)  °3. Fever over 101.5 F (38.5 C) °4. Inability to urinate °5. Nausea and/or vomiting °6. Worsening swelling or bruising °7. Continued bleeding from incision. °8. Increased pain, redness, or drainage from the incision ° °The clinic staff is available to answer your questions during regular business hours (8:30am-5pm). Please don’t hesitate to call and ask to speak to one of our nurses for clinical concerns.  °If you have a medical emergency, go to the nearest emergency room or call 911.  °A surgeon from [facility] is always on call at the hospitals  ° °[facility]  °[address], [address], [place], [place] [address] ?  °MAIN: [phone] ? TOLL FREE: [phone] ?  °FAX [phone]  °[email or web address] ° °

## 2017-02-08 NOTE — Progress Notes (Signed)
Pt c/o chest tightness and that she feels like it is acid reflux. States that she has had MI in past and does not feel like that is what it is as feeling is totally different. X Blount, [facility] floor coverage paged. Instructed to give ordered Maalox and if symptoms do not resolve perform EKG.

## 2017-02-08 NOTE — Progress Notes (Signed)
[facility]/Trauma Progress Note  1 Day Post-Op   Assessment/Plan Principal Problem:   Gallstone pancreatitis Active Problems:   Essential hypertension, benign   CKD (chronic kidney disease) stage 3, GFR 30-59 ml/min (HCC)   Anemia of chronic disease  Gallstone pancreatitis - S/P lap chole, Dr. Harlow Asa, 10/04 - LFT's still elevated and CBD defect seen on cholangiogram, asked GI to review  FEN: soft diet VTE: SCD's, lovenox ID: Ancef Follow up: [facility] office 2 weeks  DISPO: GI comments pending regarding filling defect of CBD seen on IOC. If tolerates soft diet pt is clear for discharge from surgical standpoint.    LOS: 4 days    Subjective:  CC: s/p lap chole  Pt had a BM this am. No vomiting, tolerating diet. No abdominal pain.   Objective: Vital signs in last 24 hours: Temp:  [97.5 F (36.4 C)-98.3 F (36.8 C)] 97.6 F (36.4 C) (10/05 0352) Pulse Rate:  [63-79] 63 (10/05 0352) Resp:  [12-20] 16 (10/05 0352) BP: (134-150)/(65-75) 150/71 (10/05 0352) SpO2:  [93 %-98 %] 98 % (10/05 0352) Weight:  [130 lb (59 kg)] 130 lb (59 kg) (10/04 1203) Last BM Date: 02/04/17 (Per pt report)  Intake/Output from previous day: 10/04 0701 - 10/05 0700 In: 1645 [P.O.:180; I.V.:1465] Out: 210 [Urine:200; Blood:10] Intake/Output this shift: No intake/output data recorded.  PE: Gen:  Alert, NAD, pleasant, cooperative Card:  RRR, no M/G/R heard Pulm:  CTA, no W/R/R, effort normal Abd: Soft, NT/ND, +BS, no HSM, incisions C/D/I Skin: no rashes noted, warm and dry   Anti-infectives: Anti-infectives    Start     Dose/Rate Route Frequency Ordered Stop   02/07/17 1212  ceFAZolin (ANCEF) 2-4 GM/100ML-% IVPB    Comments:  Whitlow, Cheryl   : cabinet override      02/07/17 1212 02/08/17 0014   02/07/17 0930  ceFAZolin (ANCEF) powder 1 g  Status:  Discontinued     1 g Other To Surgery 02/07/17 0916 02/07/17 0917   02/07/17 0917  ceFAZolin (ANCEF) IVPB 2g/100 mL premix      2 g 200 mL/hr over 30 Minutes Intravenous 60 min pre-op 02/07/17 0918        Lab Results:   Recent Labs  02/06/17 0552 02/07/17 0508  WBC 7.5 8.2  HGB 8.2* 8.4*  HCT 23.9* 24.7*  PLT 183 193   BMET  Recent Labs  02/07/17 0508 02/08/17 0508  NA 133* 135  K 4.2 4.6  CL 103 103  CO2 23 23  GLUCOSE 90 146*  BUN 24* 27*  CREATININE 1.40* 1.49*  CALCIUM 8.3* 8.4*   PT/INR No results for input(s): LABPROT, INR in the last 72 hours. CMP     Component Value Date/Time   NA 135 02/08/2017 0508   NA 141 08/21/2016 1046   K 4.6 02/08/2017 0508   CL 103 02/08/2017 0508   CO2 23 02/08/2017 0508   GLUCOSE 146 (H) 02/08/2017 0508   BUN 27 (H) 02/08/2017 0508   BUN 49 (H) 08/21/2016 1046   CREATININE 1.49 (H) 02/08/2017 0508   CREATININE 1.72 (H) 08/22/2015 1122   CALCIUM 8.4 (L) 02/08/2017 0508   PROT 5.7 (L) 02/08/2017 0508   ALBUMIN 2.4 (L) 02/08/2017 0508   AST 484 (H) 02/08/2017 0508   ALT 581 (H) 02/08/2017 0508   ALKPHOS 392 (H) 02/08/2017 0508   BILITOT 0.6 02/08/2017 0508   GFRNONAA 32 (L) 02/08/2017 0508   GFRAA 37 (L) 02/08/2017 0508   Lipase  Component Value Date/Time   LIPASE 60 (H) 02/07/2017 0508    Studies/Results: Dg Cholangiogram Operative  Result Date: 02/07/2017 CLINICAL DATA:  Gallstones EXAM: INTRAOPERATIVE CHOLANGIOGRAM TECHNIQUE: Cholangiographic images from the C-arm fluoroscopic device were submitted for interpretation post-operatively. Please see the procedural report for the amount of contrast and the fluoroscopy time utilized. COMPARISON:  None. FINDINGS: Contrast fills the biliary tree and duodenum without filling defects in the common bile duct. The common bile duct is dilated. There is circumferential narrowing of the distal common bile duct. This has the appearance of a small focal stricture. IMPRESSION: There is a focal stricture in the distal common bile duct. No duct stones are identified. Electronically Signed   By: Marybelle Killings M.D.   On: 02/07/2017 14:38      Kalman Drape , [facility] 02/08/2017, 8:55 AM Pager: [phone] Consults: [phone] Mon-Fri 7:00 am-4:30 pm Sat-Sun 7:00 am-11:30 am

## 2017-02-08 NOTE — Progress Notes (Signed)
Patient given discharge instructions, and verbalized an understanding of all discharge instructions.  Patient agrees with discharge plan, and is being discharged in stable medical condition.  Patient given transportation via wheelchair. 

## 2017-02-08 NOTE — Care Management Note (Signed)
Case Management Note  Patient Details  Name: Wendy Walters MRN: [id number] Date of Birth: 07/26/1932  Subjective/Objective:                  Pod 1 cholecystectomy  Action/Plan: Date:  February 08, 2017 Chart reviewed for concurrent status and case management needs.  Will continue to follow patient progress.  Discharge Planning: following for needs  Expected discharge date: February 11, 2017  Velva Harman, BSN, [place], [place]  Expected Discharge Date:                  Expected Discharge Plan:  Home/Self Care  In-House Referral:     Discharge planning Services  CM Consult  Post Acute Care Choice:    Choice offered to:     DME Arranged:    DME Agency:     HH Arranged:    HH Agency:     Status of Service:  In process, will continue to follow  If discussed at Long Length of Stay Meetings, dates discussed:    Additional Comments:  Leeroy Cha, RN 02/08/2017, 9:12 AM

## 2017-02-08 NOTE — Discharge Summary (Signed)
Discharge Summary  Wendy Walters JAS:[id number] DOB: Nov 03, 1932  PCP: Leighton Ruff, MD  Admit date: 02/04/2017 Discharge date: 02/08/2017  Time spent: >78mins, more than 50% time spent on coordination of care, case discussed with [facility] Dr Paulita Fujita over the phone.  Recommendations for Outpatient Follow-up:  1. F/u with PMD within a week  for hospital discharge follow up, repeat cbc/bmp at follow up 2. F/u with [facility] and general surgery 3. F/u with nephrology Dr  Dimas Aguas for fibrillary  glomerulonephritis  Discharge Diagnoses:  Active Hospital Problems   Diagnosis Date Noted  . Gallstone pancreatitis 02/04/2017  . CKD (chronic kidney disease) stage 3, GFR 30-59 ml/min (HCC) 02/04/2017  . Anemia of chronic disease 02/04/2017  . Essential hypertension, benign 07/30/2013    Resolved Hospital Problems   Diagnosis Date Noted Date Resolved  No resolved problems to display.    Discharge Condition: stable  Diet recommendation: heart healthy  Filed Weights   02/04/17 1719 02/07/17 1203  Weight: 59 kg (130 lb) 59 kg (130 lb)    History of present illness: (per admitting MD Dr Lorin Mercy) PCP: Leighton Ruff, MD Consultants:  Radford Pax - cardiology; [facility] - nephrology; Elba Barman - vascular Patient coming from:  Home - lives alone but in a duplex next door to her daughter; Donald Prose: daughter, [phone]  Chief Complaint: abdominal pain  HPI: Wendy Walters is a 83 y.o. female with medical history significant of hypothyroidism; HTN; HLD; fibrillary glomerulonephritis; CAD; stage 3 CKD; and anemia presenting with abdominal pain.  She "was having stomach pain and it was getting really bad".  Abdominal pain started about 1230 last night.  +N/V x 2 overnight.  Last BM was this afternoon, had multiple regular BMs yesterday.  No fever.  No sick contacts.  Still has her gallbladder.     ED Course:  Lipase 1469, LFTs markedly elevated, negative troponin/EKG/CXR.  Hgb stable.  IVF, Zofran,  morphine given.  US shows cholelithiasis without obvious cholecystitis.  Mildly prominent CBD.  Dr. Carlean Purl to see in AM.   Hospital Course:  Principal Problem:   Gallstone pancreatitis Active Problems:   Essential hypertension, benign   CKD (chronic kidney disease) stage 3, GFR 30-59 ml/min (HCC)   Anemia of chronic disease    Abdominal pain secondary to gallstone pancreatitis LFTs/lipase have markedly decreased prior to surgery and it is likely that she passed a stone. S/p cholecystectomy with IOC on 02/07/17. Ab pain has resolved ,post op, she does not have n/v, she tolerated diet, she is cleared to discharge home from surgical stand point.  "narrowing" with delay in passage of contrast into duodenum by intraoperative IOC, I discussed case with [facility] Dr Paulita Fujita over the phone who recommend outpatient follow up with GI and repeat lft in one week, call GI earlier if abdominal pain  Essential hypertension Continue Cardizem and lisinopril.  CKD stage III Secondary to fibrillary glomerulonephritis, creatinine is stable. She report has been on lisinopril 40mg  bid for years to control protein in her urine, lisinopril 40mg  bid is continued, she is to follow with pmd and nephrology.  Hypothyroidism Continue Synthroid.  CAD status post PCI in 2001 Stable.  Chronic lower extremity edema Torsemide on hold given she is getting IV fluids in the setting of pancreatitis. Torsemide restated at discharge  Family Communication/Anticipated D/C date and plan/Code Status   DVT prophylaxis: SCDs ordered. Code Status: DO NOT RESUSCITATE.  Family Communication: Daughter updated at bedside. Disposition Plan: Home .   Procedures:  S/p cholecystectomy  with [place] on 02/07/17.  Consultations:  General surgery  [facility]  Discharge Exam: BP (!) 150/71 (BP Location: Left Arm)   Pulse 63   Temp 97.6 F (36.4 C) (Oral)   Resp 16   Ht 5\' 4"  (1.626 m)   Wt 59 kg (130 lb)   SpO2 98%    BMI 22.31 kg/m   General: NAD Cardiovascular: RRR Respiratory: CTABL Ab: soft, nontender, + bs, incision C/D/I Bilateral lower extremity mild pitting edema report at baseline  Discharge Instructions You were cared for by a hospitalist during your hospital stay. If you have any questions about your discharge medications or the care you received while you were in the hospital after you are discharged, you can call the unit and asked to speak with the hospitalist on call if the hospitalist that took care of you is not available. Once you are discharged, your primary care physician will handle any further medical issues. Please note that NO REFILLS for any discharge medications will be authorized once you are discharged, as it is imperative that you return to your primary care physician (or establish a relationship with a primary care physician if you do not have one) for your aftercare needs so that they can reassess your need for medications and monitor your lab values.  Discharge Instructions    Diet - low sodium heart healthy    Complete by:  As directed    Increase activity slowly    Complete by:  As directed      Allergies as of 02/08/2017      Reactions   Codeine Nausea And Vomiting, Nausea Only      Medication List    TAKE these medications   alendronate 70 MG tablet Commonly known as:  FOSAMAX Take 70 mg by mouth once a week. Take with a full glass of water on an empty stomach.   aspirin 81 MG tablet Take 81 mg by mouth daily.   atorvastatin 80 MG tablet Commonly known as:  LIPITOR Take 1 tablet (80 mg total) by mouth daily.   CALCIUM 1200 PO Take 1 tablet by mouth daily.   CoQ10 200 MG Caps Take 1 capsule by mouth daily.   diltiazem 240 MG 24 hr capsule Commonly known as:  CARDIZEM CD Take 240 mg by mouth daily.   ezetimibe 10 MG tablet Commonly known as:  ZETIA Take 1 tablet (10 mg total) by mouth daily.   ferrous fumarate 325 (106 Fe) MG Tabs  tablet Commonly known as:  HEMOCYTE - 106 mg FE Take 1 tablet by mouth daily.   levothyroxine 88 MCG tablet Commonly known as:  SYNTHROID, LEVOTHROID Take 88 mcg by mouth daily before breakfast.   lisinopril 40 MG tablet Commonly known as:  PRINIVIL,ZESTRIL Take 1 tablet (40 mg total) by mouth 2 (two) times daily.   multivitamin tablet Take 1 tablet by mouth daily.   niacin 500 MG tablet Take 500 mg by mouth 2 (two) times daily with a meal.   OMEGA 3 PO Take 1 capsule by mouth 2 (two) times daily.   PROBIOTIC PO Take 1 capsule by mouth every 3 (three) days.   sodium bicarbonate 650 MG tablet Take 650 mg by mouth 2 (two) times daily.   torsemide 20 MG tablet Commonly known as:  DEMADEX Take 20 mg by mouth daily.   VITAMIN B COMPLEX PO Take 1 tablet by mouth daily.   vitamin C 1000 MG tablet Take 1,000 mg by mouth  daily.   zinc gluconate 50 MG tablet Take 50 mg by mouth daily.      Allergies  Allergen Reactions  . Codeine Nausea And Vomiting and Nausea Only   Follow-up [facility], [place]. Go on 02/21/2017.   Specialty:  General Surgery Why:  appointment at 2:00 pm, please arrive 30 minutes prior to complete paperwork, bring photo ID and insurance card Contact information: [address] [place], [place], [place] Follow up in 1 week(s).   Specialty:  Family Medicine Why:  hospital discharge follow up, repeat cbc/bmp at follow up Contact information: [place] [address] [phone]        Arta Silence, MD Follow up.   Specialty:  Gastroenterology Contact information: [address] [place] [address] [phone]        Dimas Aguas, MD Follow up.   Specialty:  Internal Medicine Contact information: [address] [place] [address] [phone]            The results of significant  diagnostics from this hospitalization (including imaging, microbiology, ancillary and laboratory) are listed below for reference.    Significant Diagnostic Studies: Dg Chest 2 View  Result Date: 02/04/2017 CLINICAL DATA:  Abdominal pain for 2 days. Nausea, vomiting. No chest pain or shortness of breath. EXAM: CHEST  2 VIEW COMPARISON:  None. FINDINGS: The heart size and mediastinal contours are within normal limits. Both lungs are clear. The visualized skeletal structures are unremarkable. IMPRESSION: No active cardiopulmonary disease. Electronically Signed   By: Kathreen Devoid   On: 02/04/2017 19:14   Dg Cholangiogram Operative  Result Date: 02/07/2017 CLINICAL DATA:  Gallstones EXAM: INTRAOPERATIVE CHOLANGIOGRAM TECHNIQUE: Cholangiographic images from the C-arm fluoroscopic device were submitted for interpretation post-operatively. Please see the procedural report for the amount of contrast and the fluoroscopy time utilized. COMPARISON:  None. FINDINGS: Contrast fills the biliary tree and duodenum without filling defects in the common bile duct. The common bile duct is dilated. There is circumferential narrowing of the distal common bile duct. This has the appearance of a small focal stricture. IMPRESSION: There is a focal stricture in the distal common bile duct. No duct stones are identified. Electronically Signed   By: Marybelle Killings M.D.   On: 02/07/2017 14:38   US Abdomen Limited  Result Date: 02/04/2017 CLINICAL DATA:  Initial evaluation for elevated LFTs, abdominal pain. EXAM: ULTRASOUND ABDOMEN LIMITED RIGHT UPPER QUADRANT COMPARISON:  None. FINDINGS: Gallbladder: Layering echogenic material within the gallbladder lumen suspicious for small stones. Gallbladder wall measure within normal limits at 2.4 mm. No free pericholecystic fluid. Sonographic Murphy sign not assessed on this exam. Common bile duct: Prominent measuring up to 9 mm, but equivocal in size for patient age. Liver: No focal lesion  identified. Diffusely increased echogenicity, suggestive of steatosis. Portal vein is patent on color Doppler imaging with normal direction of blood flow towards the liver. IMPRESSION: 1. Cholelithiasis without additional sonographic features to suggest acute cholecystitis. 2. Mildly prominent common bile duct measuring up to 9 mm, equivocal/upper limits normal for patient age. 3. Hepatic steatosis. Electronically Signed   By: Jeannine Boga M.D.   On: 02/04/2017 20:36    Microbiology: Recent Results (from the past 240 hour(s))  Surgical pcr screen     Status: None   Collection Time: 02/07/17 11:10 AM  Result Value Ref Range Status  MRSA, PCR NEGATIVE NEGATIVE Final   Staphylococcus aureus NEGATIVE NEGATIVE Final    Comment: (NOTE) The Xpert SA Assay (FDA approved for NASAL specimens in patients 37 years of age and older), is one component of a comprehensive surveillance program. It is not intended to diagnose infection nor to guide or monitor treatment.      Labs: Basic Metabolic Panel:  Recent Labs Lab 02/04/17 1745 02/05/17 0600 02/06/17 0552 02/07/17 0508 02/08/17 0508  NA 135 137 137 133* 135  K 3.9 4.3 3.8 4.2 4.6  CL 102 104 104 103 103  CO2 24 26 26 23 23   GLUCOSE 108* 93 92 90 146*  BUN 28* 26* 25* 24* 27*  CREATININE 1.48* 1.46* 1.49* 1.40* 1.49*  CALCIUM 9.5 8.8* 8.4* 8.3* 8.4*  MG  --   --  1.5*  --   --    Liver Function Tests:  Recent Labs Lab 02/04/17 1745 02/05/17 0600 02/06/17 0552 02/07/17 0508 02/08/17 0508  AST 627* 1,650* 574* 258* 484*  ALT 429* 1,426* 837* 570* 581*  ALKPHOS 155* 164* 166* 168* 392*  BILITOT 0.9 0.5 0.6 0.7 0.6  PROT 6.0* 5.3* 5.1* 5.2* 5.7*  ALBUMIN 3.1* 2.7* 2.4* 2.5* 2.4*    Recent Labs Lab 02/04/17 1745 02/07/17 0508  LIPASE 1,469* 60*   No results for input(s): AMMONIA in the last 168 hours. CBC:  Recent Labs Lab 02/04/17 1745 02/05/17 0600 02/06/17 0552 02/07/17 0508  WBC 7.8 4.1 7.5 8.2   NEUTROABS 6.6  --  6.0  --   HGB 9.1* 8.1* 8.2* 8.4*  HCT 27.0* 23.6* 23.9* 24.7*  MCV 89.4 89.4 89.2 89.2  PLT 226 192 183 193   Cardiac Enzymes: No results for input(s): CKTOTAL, CKMB, CKMBINDEX, TROPONINI in the last 168 hours. BNP: BNP (last 3 results) No results for input(s): BNP in the last 8760 hours.  ProBNP (last 3 results) No results for input(s): PROBNP in the last 8760 hours.  CBG: No results for input(s): GLUCAP in the last 168 hours.     SignedFlorencia Reasons MD, PhD  [facility] 02/08/2017, 1:05 PM

## 2017-02-08 NOTE — Progress Notes (Signed)
Pt states that "weird" feeling has already passed, still agreed to take Maalox. Explained the need for pt to notify RN if symptoms come back or if chest feels heavy or she develops chest pain, pt verbalized understanding.

## 2017-02-08 NOTE — Progress Notes (Signed)
No filling defects on IOC, but has a distal CBD stricture. If pt does well could go home and f/u in a few days as OP with Dr Paulita Fujita ? Need for ERCP, MRCP, EUS. Etc.

## 2017-02-13 DIAGNOSIS — R809 Proteinuria, unspecified: Secondary | ICD-10-CM | POA: Diagnosis not present

## 2017-02-13 DIAGNOSIS — N390 Urinary tract infection, site not specified: Secondary | ICD-10-CM | POA: Diagnosis not present

## 2017-02-13 DIAGNOSIS — I10 Essential (primary) hypertension: Secondary | ICD-10-CM | POA: Diagnosis not present

## 2017-02-13 DIAGNOSIS — D631 Anemia in chronic kidney disease: Secondary | ICD-10-CM | POA: Diagnosis not present

## 2017-02-13 DIAGNOSIS — N183 Chronic kidney disease, stage 3 (moderate): Secondary | ICD-10-CM | POA: Diagnosis not present

## 2017-02-18 DIAGNOSIS — R748 Abnormal levels of other serum enzymes: Secondary | ICD-10-CM | POA: Diagnosis not present

## 2017-02-18 DIAGNOSIS — N059 Unspecified nephritic syndrome with unspecified morphologic changes: Secondary | ICD-10-CM | POA: Diagnosis not present

## 2017-02-18 DIAGNOSIS — Z9049 Acquired absence of other specified parts of digestive tract: Secondary | ICD-10-CM | POA: Diagnosis not present

## 2017-02-18 DIAGNOSIS — D631 Anemia in chronic kidney disease: Secondary | ICD-10-CM | POA: Diagnosis not present

## 2017-02-18 DIAGNOSIS — N183 Chronic kidney disease, stage 3 (moderate): Secondary | ICD-10-CM | POA: Diagnosis not present

## 2017-02-18 DIAGNOSIS — Z8719 Personal history of other diseases of the digestive system: Secondary | ICD-10-CM | POA: Diagnosis not present

## 2017-02-22 ENCOUNTER — Other Ambulatory Visit: Payer: Self-pay | Admitting: Cardiology

## 2017-02-26 DIAGNOSIS — E878 Other disorders of electrolyte and fluid balance, not elsewhere classified: Secondary | ICD-10-CM | POA: Diagnosis not present

## 2017-02-26 DIAGNOSIS — D649 Anemia, unspecified: Secondary | ICD-10-CM | POA: Diagnosis not present

## 2017-02-26 DIAGNOSIS — S22080A Wedge compression fracture of T11-T12 vertebra, initial encounter for closed fracture: Secondary | ICD-10-CM | POA: Diagnosis not present

## 2017-02-27 DIAGNOSIS — E039 Hypothyroidism, unspecified: Secondary | ICD-10-CM | POA: Diagnosis not present

## 2017-02-27 DIAGNOSIS — Z9181 History of falling: Secondary | ICD-10-CM | POA: Diagnosis not present

## 2017-02-27 DIAGNOSIS — Z955 Presence of coronary angioplasty implant and graft: Secondary | ICD-10-CM | POA: Diagnosis not present

## 2017-02-27 DIAGNOSIS — I361 Nonrheumatic tricuspid (valve) insufficiency: Secondary | ICD-10-CM | POA: Diagnosis not present

## 2017-02-27 DIAGNOSIS — R600 Localized edema: Secondary | ICD-10-CM | POA: Diagnosis not present

## 2017-02-27 DIAGNOSIS — D649 Anemia, unspecified: Secondary | ICD-10-CM | POA: Diagnosis not present

## 2017-02-27 DIAGNOSIS — N183 Chronic kidney disease, stage 3 (moderate): Secondary | ICD-10-CM | POA: Diagnosis not present

## 2017-02-27 DIAGNOSIS — I129 Hypertensive chronic kidney disease with stage 1 through stage 4 chronic kidney disease, or unspecified chronic kidney disease: Secondary | ICD-10-CM | POA: Diagnosis not present

## 2017-02-27 DIAGNOSIS — I10 Essential (primary) hypertension: Secondary | ICD-10-CM | POA: Diagnosis not present

## 2017-02-27 DIAGNOSIS — N184 Chronic kidney disease, stage 4 (severe): Secondary | ICD-10-CM | POA: Diagnosis not present

## 2017-02-27 DIAGNOSIS — N179 Acute kidney failure, unspecified: Secondary | ICD-10-CM | POA: Diagnosis not present

## 2017-02-27 DIAGNOSIS — E876 Hypokalemia: Secondary | ICD-10-CM | POA: Diagnosis not present

## 2017-02-27 DIAGNOSIS — J449 Chronic obstructive pulmonary disease, unspecified: Secondary | ICD-10-CM | POA: Diagnosis not present

## 2017-02-27 DIAGNOSIS — R601 Generalized edema: Secondary | ICD-10-CM | POA: Diagnosis not present

## 2017-02-27 DIAGNOSIS — Z9049 Acquired absence of other specified parts of digestive tract: Secondary | ICD-10-CM | POA: Diagnosis not present

## 2017-02-27 DIAGNOSIS — M549 Dorsalgia, unspecified: Secondary | ICD-10-CM | POA: Diagnosis not present

## 2017-02-27 DIAGNOSIS — T148XXD Other injury of unspecified body region, subsequent encounter: Secondary | ICD-10-CM | POA: Diagnosis not present

## 2017-02-27 DIAGNOSIS — M545 Low back pain: Secondary | ICD-10-CM | POA: Diagnosis not present

## 2017-02-27 DIAGNOSIS — M48061 Spinal stenosis, lumbar region without neurogenic claudication: Secondary | ICD-10-CM | POA: Diagnosis not present

## 2017-02-27 DIAGNOSIS — D631 Anemia in chronic kidney disease: Secondary | ICD-10-CM | POA: Diagnosis not present

## 2017-02-27 DIAGNOSIS — S22080A Wedge compression fracture of T11-T12 vertebra, initial encounter for closed fracture: Secondary | ICD-10-CM | POA: Diagnosis not present

## 2017-02-27 DIAGNOSIS — E872 Acidosis: Secondary | ICD-10-CM | POA: Diagnosis not present

## 2017-02-27 DIAGNOSIS — M7989 Other specified soft tissue disorders: Secondary | ICD-10-CM | POA: Diagnosis not present

## 2017-02-27 DIAGNOSIS — Z87891 Personal history of nicotine dependence: Secondary | ICD-10-CM | POA: Diagnosis not present

## 2017-02-27 DIAGNOSIS — I5030 Unspecified diastolic (congestive) heart failure: Secondary | ICD-10-CM | POA: Diagnosis not present

## 2017-02-27 DIAGNOSIS — N390 Urinary tract infection, site not specified: Secondary | ICD-10-CM | POA: Diagnosis not present

## 2017-02-27 DIAGNOSIS — Z7982 Long term (current) use of aspirin: Secondary | ICD-10-CM | POA: Diagnosis not present

## 2017-02-27 DIAGNOSIS — I130 Hypertensive heart and chronic kidney disease with heart failure and stage 1 through stage 4 chronic kidney disease, or unspecified chronic kidney disease: Secondary | ICD-10-CM | POA: Diagnosis not present

## 2017-02-27 DIAGNOSIS — I340 Nonrheumatic mitral (valve) insufficiency: Secondary | ICD-10-CM | POA: Diagnosis not present

## 2017-02-27 DIAGNOSIS — N186 End stage renal disease: Secondary | ICD-10-CM | POA: Diagnosis not present

## 2017-03-11 DIAGNOSIS — D631 Anemia in chronic kidney disease: Secondary | ICD-10-CM | POA: Diagnosis not present

## 2017-03-11 DIAGNOSIS — N183 Chronic kidney disease, stage 3 (moderate): Secondary | ICD-10-CM | POA: Diagnosis not present

## 2017-03-11 DIAGNOSIS — R899 Unspecified abnormal finding in specimens from other organs, systems and tissues: Secondary | ICD-10-CM | POA: Diagnosis not present

## 2017-03-11 DIAGNOSIS — I10 Essential (primary) hypertension: Secondary | ICD-10-CM | POA: Diagnosis not present

## 2017-03-13 DIAGNOSIS — D631 Anemia in chronic kidney disease: Secondary | ICD-10-CM | POA: Diagnosis not present

## 2017-03-13 DIAGNOSIS — N390 Urinary tract infection, site not specified: Secondary | ICD-10-CM | POA: Diagnosis not present

## 2017-03-13 DIAGNOSIS — I10 Essential (primary) hypertension: Secondary | ICD-10-CM | POA: Diagnosis not present

## 2017-03-13 DIAGNOSIS — N183 Chronic kidney disease, stage 3 (moderate): Secondary | ICD-10-CM | POA: Diagnosis not present

## 2017-06-10 DIAGNOSIS — Z85828 Personal history of other malignant neoplasm of skin: Secondary | ICD-10-CM | POA: Diagnosis not present

## 2017-06-10 DIAGNOSIS — Z08 Encounter for follow-up examination after completed treatment for malignant neoplasm: Secondary | ICD-10-CM | POA: Diagnosis not present

## 2017-06-20 DIAGNOSIS — H26493 Other secondary cataract, bilateral: Secondary | ICD-10-CM | POA: Diagnosis not present

## 2017-06-20 DIAGNOSIS — D3131 Benign neoplasm of right choroid: Secondary | ICD-10-CM | POA: Diagnosis not present

## 2017-06-20 DIAGNOSIS — H35372 Puckering of macula, left eye: Secondary | ICD-10-CM | POA: Diagnosis not present

## 2017-07-05 DIAGNOSIS — I10 Essential (primary) hypertension: Secondary | ICD-10-CM | POA: Diagnosis not present

## 2017-07-05 DIAGNOSIS — N183 Chronic kidney disease, stage 3 (moderate): Secondary | ICD-10-CM | POA: Diagnosis not present

## 2017-07-05 DIAGNOSIS — R809 Proteinuria, unspecified: Secondary | ICD-10-CM | POA: Diagnosis not present

## 2017-07-05 DIAGNOSIS — D631 Anemia in chronic kidney disease: Secondary | ICD-10-CM | POA: Diagnosis not present

## 2017-07-11 DIAGNOSIS — I10 Essential (primary) hypertension: Secondary | ICD-10-CM | POA: Diagnosis not present

## 2017-07-11 DIAGNOSIS — D631 Anemia in chronic kidney disease: Secondary | ICD-10-CM | POA: Diagnosis not present

## 2017-07-11 DIAGNOSIS — N183 Chronic kidney disease, stage 3 (moderate): Secondary | ICD-10-CM | POA: Diagnosis not present

## 2017-07-11 DIAGNOSIS — N390 Urinary tract infection, site not specified: Secondary | ICD-10-CM | POA: Diagnosis not present

## 2017-09-23 ENCOUNTER — Other Ambulatory Visit: Payer: Self-pay | Admitting: Cardiology

## 2017-10-09 ENCOUNTER — Ambulatory Visit (INDEPENDENT_AMBULATORY_CARE_PROVIDER_SITE_OTHER): Payer: Medicare Other | Admitting: Cardiology

## 2017-10-09 ENCOUNTER — Encounter: Payer: Self-pay | Admitting: Cardiology

## 2017-10-09 VITALS — BP 126/72 | HR 63 | Ht 64.0 in | Wt 132.6 lb

## 2017-10-09 DIAGNOSIS — I10 Essential (primary) hypertension: Secondary | ICD-10-CM

## 2017-10-09 DIAGNOSIS — I2510 Atherosclerotic heart disease of native coronary artery without angina pectoris: Secondary | ICD-10-CM

## 2017-10-09 DIAGNOSIS — E7800 Pure hypercholesterolemia, unspecified: Secondary | ICD-10-CM | POA: Diagnosis not present

## 2017-10-09 DIAGNOSIS — R600 Localized edema: Secondary | ICD-10-CM

## 2017-10-09 DIAGNOSIS — I6523 Occlusion and stenosis of bilateral carotid arteries: Secondary | ICD-10-CM | POA: Diagnosis not present

## 2017-10-09 LAB — LIPID PANEL
Chol/HDL Ratio: 2.4 ratio (ref 0.0–4.4)
Cholesterol, Total: 160 mg/dL (ref 100–199)
HDL: 68 mg/dL (ref >39)
LDL CALC: 62 mg/dL (ref 0–99)
TRIGLYCERIDES: 152 mg/dL — AB (ref 0–149)
VLDL CHOLESTEROL CAL: 30 mg/dL (ref 5–40)

## 2017-10-09 LAB — HEPATIC FUNCTION PANEL
ALT: 20 IU/L (ref 0–32)
AST: 31 IU/L (ref 0–40)
Albumin: 3.9 g/dL (ref 3.5–4.7)
Alkaline Phosphatase: 78 IU/L (ref 39–117)
Bilirubin Total: 0.3 mg/dL (ref 0.0–1.2)
Bilirubin, Direct: 0.09 mg/dL (ref 0.00–0.40)
Total Protein: 6.2 g/dL (ref 6.0–8.5)

## 2017-10-09 MED ORDER — ASPIRIN EC 81 MG PO TBEC: 81.0000 mg | DELAYED_RELEASE_TABLET | Freq: Every day | ORAL | 3 refills | Status: DC

## 2017-10-09 NOTE — Patient Instructions (Signed)
Medication Instructions:  Your physician has recommended you make the following change in your medication:  DECREASE: aspirin to 81 mg once a day   If you need a refill on your cardiac medications, please contact your pharmacy first.  Labwork: Today for fasting lipid panel and liver function test   Testing/Procedures: None ordered   Follow-Up: Your physician wants you to follow-up in: 1 year with Dr. Radford Pax. You will receive a reminder letter in the mail two months in advance. If you don't receive a letter, please call our office to schedule the follow-up appointment.  Any Other Special Instructions Will Be Listed Below (If Applicable).   Thank you for choosing [facility], RN  [phone]  If you need a refill on your cardiac medications before your next appointment, please call your pharmacy.

## 2017-10-09 NOTE — Progress Notes (Signed)
Cardiology Office Note:    Date:  3/0/8657   ID:  Mikael Debell, DOB [birth year], MRN [id number]  PCP:  Leighton Ruff, MD  Cardiologist:  No primary care provider on file.    Referring MD: Leighton Ruff, MD   Chief Complaint  Patient presents with  . Coronary Artery Disease  . Hypertension  . Hyperlipidemia    History of Present Illness:    Wendy Walters is a 84 y.o. female with a hx of ASCAD s/p PCI of LAD 2001 in setting of AWMI and cath 2005 with patent stent to the LAD, dyslipidemia, carotid artery stenosis and HTN.  She is here today for followup and is doing well.  Since I saw her last she had her cholecystectomy and is feeling well from that.  She had no complications from the surgery.  She denies any chest pain or pressure, SOB, DOE, PND, orthopnea, dizziness, palpitations or syncope.  She has chronic lower extremity edema which is well controlled with Demadex as well as low-sodium diet.  She is compliant with her meds and is tolerating meds with no SE.    Past Medical History:  Diagnosis Date  . Anemia   . Bradycardia 08/22/2015  . Carotid artery occlusion   . Chronic kidney disease    stage 3  . Coronary artery disease    s/p PCI 2001 of lad in setting of AWMI-rotational atherectomy with BMS -Dr Radford Pax  . Edema extremities 08/21/2016  . Fibrillary glomerulonephritis    w nephrotic range protenuria, following w neurology, managed with an ACE-I and ARB  . H/O Doppler ultrasound 01/2003   ,39% bilateral carotid stenosis.  . Hyperlipidemia   . Hypertension   . Hypothyroidism   . Osteoporosis    s/p actonel for about 7-8 years    Past Surgical History:  Procedure Laterality Date  . ANGIOPLASTY  2001  . CARDIAC CATHETERIZATION  2005   patent LAD stent  . CHOLECYSTECTOMY N/A 02/07/2017   Procedure: LAPAROSCOPIC CHOLECYSTECTOMY WITH INTRAOPERATIVE CHOLANGIOGRAM;  Surgeon: Armandina Gemma, MD;  Location: [facility];  Service: General;  Laterality: N/A;  . EYE  SURGERY Bilateral 2012   Cataract  . TONSILLECTOMY  1942    Current Medications: Current Meds  Medication Sig  . alendronate (FOSAMAX) 70 MG tablet Take 70 mg by mouth once a week. Take with a full glass of water on an empty stomach.  . Ascorbic Acid (VITAMIN C) 1000 MG tablet Take 1,000 mg by mouth daily.  Marland Kitchen aspirin 325 MG EC tablet Take 325 mg by mouth daily.  Marland Kitchen atorvastatin (LIPITOR) 80 MG tablet Take 1 tablet (80 mg total) by mouth daily. Please keep upcoming appt for future refills. Thank you  . B Complex Vitamins (VITAMIN B COMPLEX PO) Take 1 tablet by mouth daily.   . Calcium Carbonate-Vit D-Min (CALCIUM 1200 PO) Take 1 tablet by mouth daily.  . Coenzyme Q10 (COQ10) 200 MG CAPS Take 1 capsule by mouth daily.   Marland Kitchen diltiazem (CARDIZEM CD) 240 MG 24 hr capsule Take 240 mg by mouth daily.  Marland Kitchen ezetimibe (ZETIA) 10 MG tablet TAKE 1 TABLET EVERY DAY  . ferrous fumarate (HEMOCYTE - 106 MG FE) 325 (106 FE) MG TABS tablet Take 1 tablet by mouth daily.  Marland Kitchen levothyroxine (SYNTHROID, LEVOTHROID) 88 MCG tablet Take 88 mcg by mouth daily before breakfast.  . lisinopril (PRINIVIL,ZESTRIL) 40 MG tablet Take 1 tablet (40 mg total) by mouth 2 (two) times daily.  . Multiple Vitamin (MULTIVITAMIN) tablet Take  1 tablet by mouth daily.  . niacin 500 MG tablet Take 500 mg by mouth 2 (two) times daily with a meal.  . Omega-3 Fatty Acids (OMEGA 3 PO) Take 1 capsule by mouth 2 (two) times daily.  . Probiotic Product (PROBIOTIC PO) Take 1 capsule by mouth every 3 (three) days.  . sodium bicarbonate 650 MG tablet Take 650 mg by mouth 2 (two) times daily.  Marland Kitchen torsemide (DEMADEX) 20 MG tablet Take 20 mg by mouth 2 (two) times daily.  Marland Kitchen zinc gluconate 50 MG tablet Take 50 mg by mouth daily.     Allergies:   Codeine   Social History   Socioeconomic History  . Marital status: Widowed    Spouse name: Not on file  . Number of children: Not on file  . Years of education: Not on file  . Highest education level:  Not on file  Occupational History  . Occupation: retired  Scientific laboratory technician  . Financial resource strain: Not on file  . Food insecurity:    Worry: Not on file    Inability: Not on file  . Transportation needs:    Medical: Not on file    Non-medical: Not on file  Tobacco Use  . Smoking status: Former Smoker    Last attempt to quit: 06/08/1970    Years since quitting: 47.3  . Smokeless tobacco: Never Used  . Tobacco comment: quit in 1972  Substance and Sexual Activity  . Alcohol use: No    Comment: quit in her 69s  . Drug use: No  . Sexual activity: Not on file  Lifestyle  . Physical activity:    Days per week: Not on file    Minutes per session: Not on file  . Stress: Not on file  Relationships  . Social connections:    Talks on phone: Not on file    Gets together: Not on file    Attends religious service: Not on file    Active member of club or organization: Not on file    Attends meetings of clubs or organizations: Not on file    Relationship status: Not on file  Other Topics Concern  . Not on file  Social History Narrative  . Not on file     Family History: The patient's family history includes Cancer in her father; Cancer - Prostate in her father; Diabetes in her daughter; Hypertension in her daughter and mother.  ROS:   Please see the history of present illness.    ROS  All other systems reviewed and negative.   EKGs/Labs/Other Studies Reviewed:    The following studies were reviewed today: none  EKG:  EKG is  ordered today.  The ekg ordered today demonstrates NSR with ICVCD  Recent Labs: 02/05/2017: TSH 2.052 02/06/2017: Magnesium 1.5 02/07/2017: Hemoglobin 8.4; Platelets 193 02/08/2017: ALT 581; BUN 27; Creatinine, Ser 1.49; Potassium 4.6; Sodium 135   Recent Lipid Panel    Component Value Date/Time   CHOL 161 04/13/2016 0935   TRIG 138 04/13/2016 0935   HDL 68 04/13/2016 0935   CHOLHDL 2.4 04/13/2016 0935   VLDL 28 04/13/2016 0935   LDLCALC 65  04/13/2016 0935    Physical Exam:    VS:  BP 126/72   Pulse 63   Ht 5\' 4"  (1.626 m)   Wt 132 lb 9.6 oz (60.1 kg)   BMI 22.76 kg/m     Wt Readings from Last 3 Encounters:  10/09/17 132 lb 9.6 oz (60.1  kg)  02/07/17 130 lb (59 kg)  11/16/16 135 lb (61.2 kg)     GEN:  Well nourished, well developed in no acute distress HEENT: Normal NECK: No JVD; No carotid bruits LYMPHATICS: No lymphadenopathy CARDIAC: RRR, no murmurs, rubs, gallops RESPIRATORY:  Clear to auscultation without rales, wheezing or rhonchi  ABDOMEN: Soft, non-tender, non-distended MUSCULOSKELETAL:  No edema; No deformity  SKIN: Warm and dry NEUROLOGIC:  Alert and oriented x 3 PSYCHIATRIC:  Normal affect   ASSESSMENT:    1. Coronary artery disease involving native coronary artery of native heart without angina pectoris   2. Essential hypertension, benign   3. Bilateral carotid artery occlusion   4. Pure hypercholesterolemia   5. Edema extremities    PLAN:    In order of problems listed above:  1.  ASCAD - ASCAD s/p PCI of LAD 2001 in setting of AWMI and cath 2005 with patent stent to the LAD.  She denies any anginal chest pain.  She will continue on aspirin decreased to 81 mg daily.  She will also continue on statin therapy.  2.  Hypertension -BP is well controlled on exam today.  She will continue on lisinopril 40 mg twice daily and Cardizem CD 240 mg daily.  Creatinine was stable at 1.55 on 07/05/2017.  3.  Bilateral carotid artery stenosis -Doppler 11/16/2016 showed 1 to 39% bilateral stenosis.  She will continue on aspirin and statin therapy.  4.  Hyperlipidemia with LDL goal less than 70 -she will continue on Lipitor 80 mg daily.  Her LDL was 57 on 02/01/2017.  ALT was normal at 25 on 07/05/2017.  5.  Chronic lower extremity edema -is well controlled on torsemide 20 mg twice daily.    Medication Adjustments/Labs and Tests Ordered: Current medicines are reviewed at length with the patient today.   Concerns regarding medicines are outlined above.  Orders Placed This Encounter  Procedures  . EKG 12-Lead   No orders of the defined types were placed in this encounter.   Signed, Fransico Him, MD  10/09/2017 9:00 AM    [place]

## 2017-11-06 DIAGNOSIS — N186 End stage renal disease: Secondary | ICD-10-CM | POA: Diagnosis not present

## 2017-11-06 DIAGNOSIS — I10 Essential (primary) hypertension: Secondary | ICD-10-CM | POA: Diagnosis not present

## 2017-11-06 DIAGNOSIS — R809 Proteinuria, unspecified: Secondary | ICD-10-CM | POA: Diagnosis not present

## 2017-11-06 DIAGNOSIS — N183 Chronic kidney disease, stage 3 (moderate): Secondary | ICD-10-CM | POA: Diagnosis not present

## 2017-11-06 DIAGNOSIS — D631 Anemia in chronic kidney disease: Secondary | ICD-10-CM | POA: Diagnosis not present

## 2017-11-11 DIAGNOSIS — N390 Urinary tract infection, site not specified: Secondary | ICD-10-CM | POA: Diagnosis not present

## 2017-11-11 DIAGNOSIS — D631 Anemia in chronic kidney disease: Secondary | ICD-10-CM | POA: Diagnosis not present

## 2017-11-11 DIAGNOSIS — N183 Chronic kidney disease, stage 3 (moderate): Secondary | ICD-10-CM | POA: Diagnosis not present

## 2017-11-11 DIAGNOSIS — I10 Essential (primary) hypertension: Secondary | ICD-10-CM | POA: Diagnosis not present

## 2017-11-18 DIAGNOSIS — N281 Cyst of kidney, acquired: Secondary | ICD-10-CM | POA: Diagnosis not present

## 2017-11-18 DIAGNOSIS — I701 Atherosclerosis of renal artery: Secondary | ICD-10-CM | POA: Diagnosis not present

## 2018-01-13 DIAGNOSIS — Z08 Encounter for follow-up examination after completed treatment for malignant neoplasm: Secondary | ICD-10-CM | POA: Diagnosis not present

## 2018-01-13 DIAGNOSIS — L720 Epidermal cyst: Secondary | ICD-10-CM | POA: Diagnosis not present

## 2018-01-13 DIAGNOSIS — L820 Inflamed seborrheic keratosis: Secondary | ICD-10-CM | POA: Diagnosis not present

## 2018-01-13 DIAGNOSIS — Z85828 Personal history of other malignant neoplasm of skin: Secondary | ICD-10-CM | POA: Diagnosis not present

## 2018-01-24 ENCOUNTER — Other Ambulatory Visit: Payer: Self-pay | Admitting: Cardiology

## 2018-02-08 DIAGNOSIS — R55 Syncope and collapse: Secondary | ICD-10-CM | POA: Diagnosis not present

## 2018-02-18 DIAGNOSIS — E7800 Pure hypercholesterolemia, unspecified: Secondary | ICD-10-CM | POA: Diagnosis not present

## 2018-02-18 DIAGNOSIS — I779 Disorder of arteries and arterioles, unspecified: Secondary | ICD-10-CM | POA: Diagnosis not present

## 2018-02-18 DIAGNOSIS — Z23 Encounter for immunization: Secondary | ICD-10-CM | POA: Diagnosis not present

## 2018-02-18 DIAGNOSIS — Z1389 Encounter for screening for other disorder: Secondary | ICD-10-CM | POA: Diagnosis not present

## 2018-02-18 DIAGNOSIS — M810 Age-related osteoporosis without current pathological fracture: Secondary | ICD-10-CM | POA: Diagnosis not present

## 2018-02-18 DIAGNOSIS — E559 Vitamin D deficiency, unspecified: Secondary | ICD-10-CM | POA: Diagnosis not present

## 2018-02-18 DIAGNOSIS — M542 Cervicalgia: Secondary | ICD-10-CM | POA: Diagnosis not present

## 2018-02-18 DIAGNOSIS — Z0000 Encounter for general adult medical examination without abnormal findings: Secondary | ICD-10-CM | POA: Diagnosis not present

## 2018-02-18 DIAGNOSIS — E039 Hypothyroidism, unspecified: Secondary | ICD-10-CM | POA: Diagnosis not present

## 2018-02-18 DIAGNOSIS — D638 Anemia in other chronic diseases classified elsewhere: Secondary | ICD-10-CM | POA: Diagnosis not present

## 2018-02-18 DIAGNOSIS — N183 Chronic kidney disease, stage 3 (moderate): Secondary | ICD-10-CM | POA: Diagnosis not present

## 2018-02-25 DIAGNOSIS — M542 Cervicalgia: Secondary | ICD-10-CM | POA: Diagnosis not present

## 2018-03-14 DIAGNOSIS — I10 Essential (primary) hypertension: Secondary | ICD-10-CM | POA: Diagnosis not present

## 2018-03-14 DIAGNOSIS — D631 Anemia in chronic kidney disease: Secondary | ICD-10-CM | POA: Diagnosis not present

## 2018-03-14 DIAGNOSIS — R809 Proteinuria, unspecified: Secondary | ICD-10-CM | POA: Diagnosis not present

## 2018-03-14 DIAGNOSIS — N183 Chronic kidney disease, stage 3 (moderate): Secondary | ICD-10-CM | POA: Diagnosis not present

## 2018-03-14 DIAGNOSIS — N186 End stage renal disease: Secondary | ICD-10-CM | POA: Diagnosis not present

## 2018-03-14 IMAGING — CR DG CHEST 2V
2 series · 2 of 2 positions shown · non-contrast
Comparison: None.

CLINICAL DATA: Abdominal pain for 2 days. Nausea, vomiting. No
chest pain or shortness of breath.

EXAM:
CHEST  2 VIEW

[w chest lat]
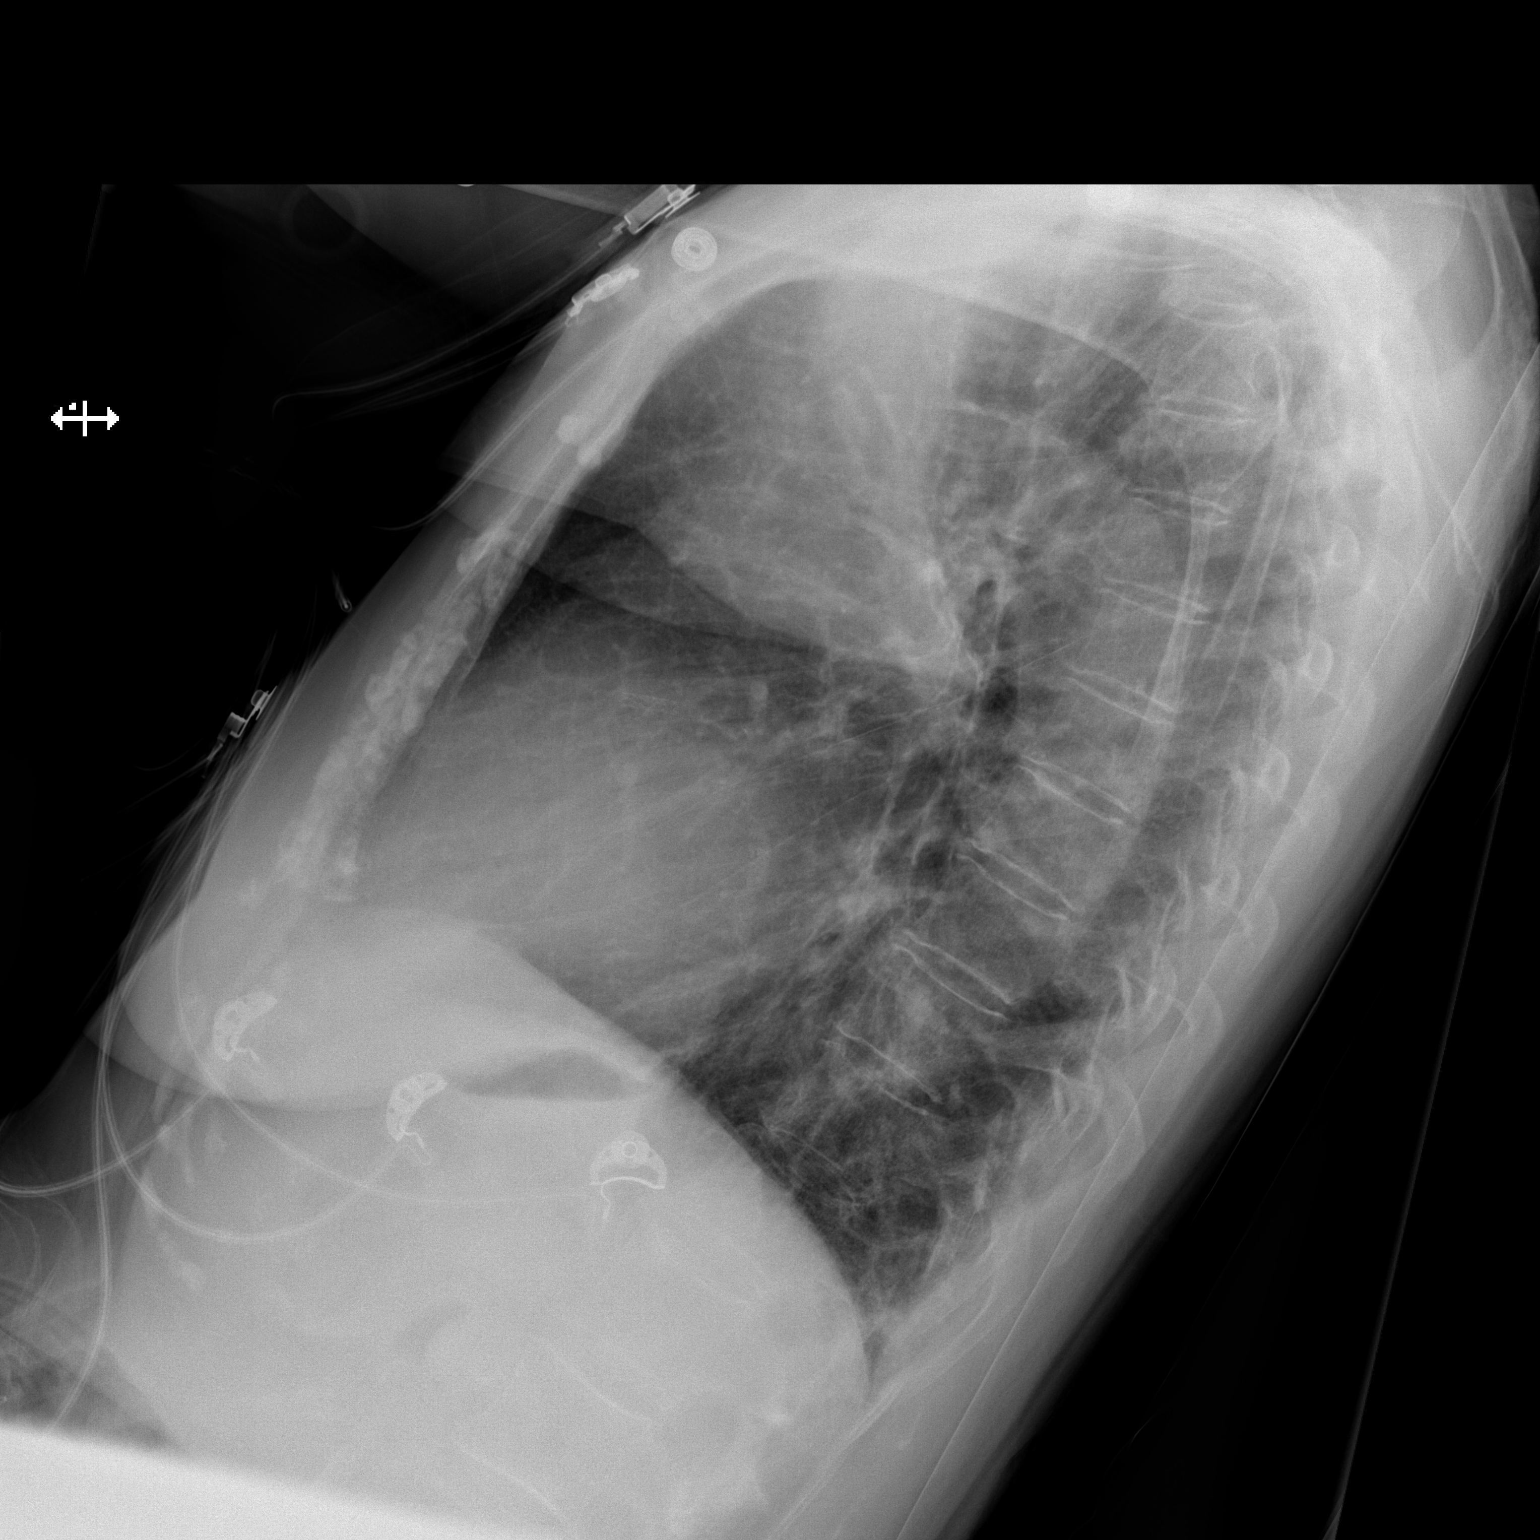

[x chest ap]
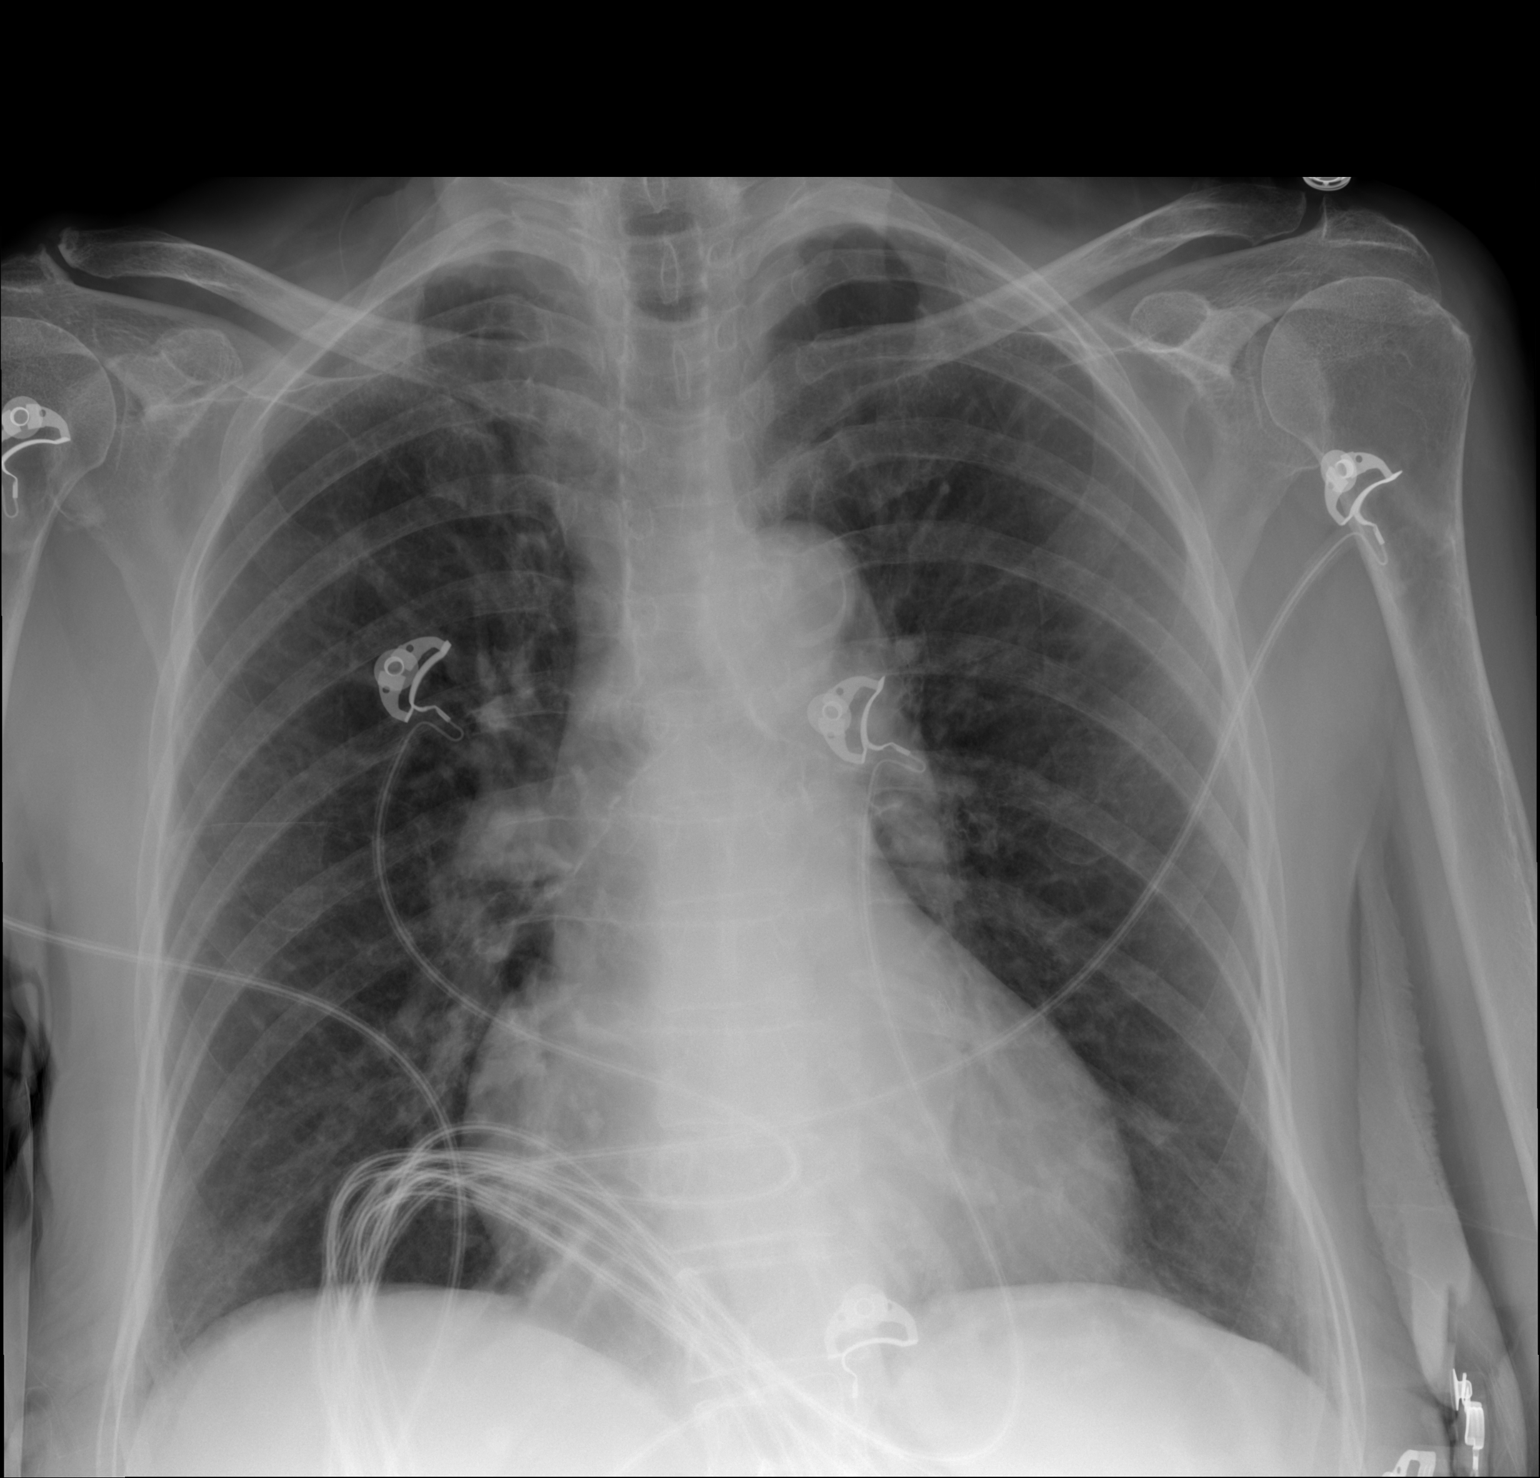

[2 of 2 positions shown; findings below may reference images not displayed]

FINDINGS: The heart size and mediastinal contours are within normal limits.
Both lungs are clear. The visualized skeletal structures are
unremarkable.
IMPRESSION: No active cardiopulmonary disease.

## 2018-03-17 DIAGNOSIS — N390 Urinary tract infection, site not specified: Secondary | ICD-10-CM | POA: Diagnosis not present

## 2018-03-17 DIAGNOSIS — I10 Essential (primary) hypertension: Secondary | ICD-10-CM | POA: Diagnosis not present

## 2018-03-17 DIAGNOSIS — R809 Proteinuria, unspecified: Secondary | ICD-10-CM | POA: Diagnosis not present

## 2018-03-17 DIAGNOSIS — N183 Chronic kidney disease, stage 3 (moderate): Secondary | ICD-10-CM | POA: Diagnosis not present

## 2018-03-17 IMAGING — RF DG CHOLANGIOGRAM OPERATIVE
1 series · 11 of 11 positions shown · non-contrast
Comparison: None.

CLINICAL DATA: Gallstones

EXAM:
INTRAOPERATIVE CHOLANGIOGRAM
TECHNIQUE: Cholangiographic images from the C-arm fluoroscopic device were
submitted for interpretation post-operatively. Please see the
procedural report for the amount of contrast and the fluoroscopy
time utilized.

[Series 1: run · 3 acquisitions, 11 frames shown]
[im 1/3]
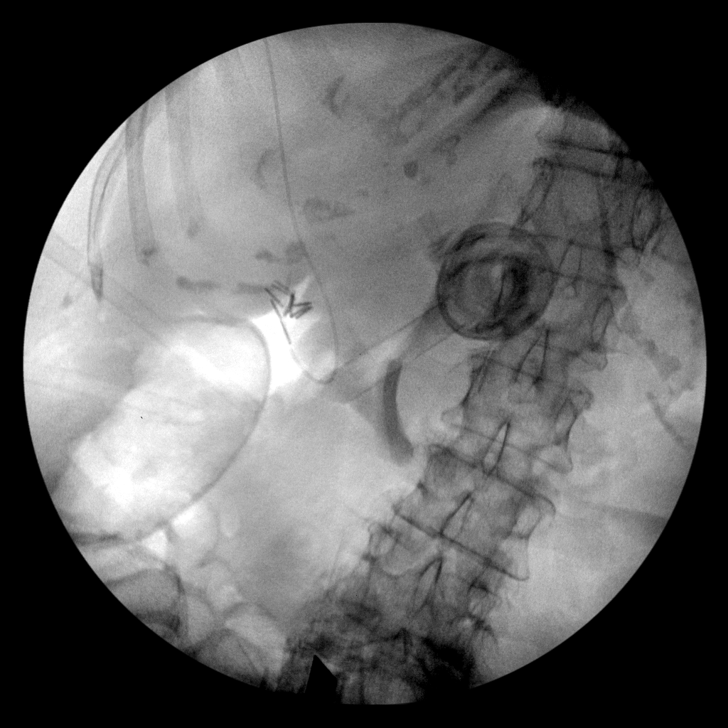
[im 1/3]
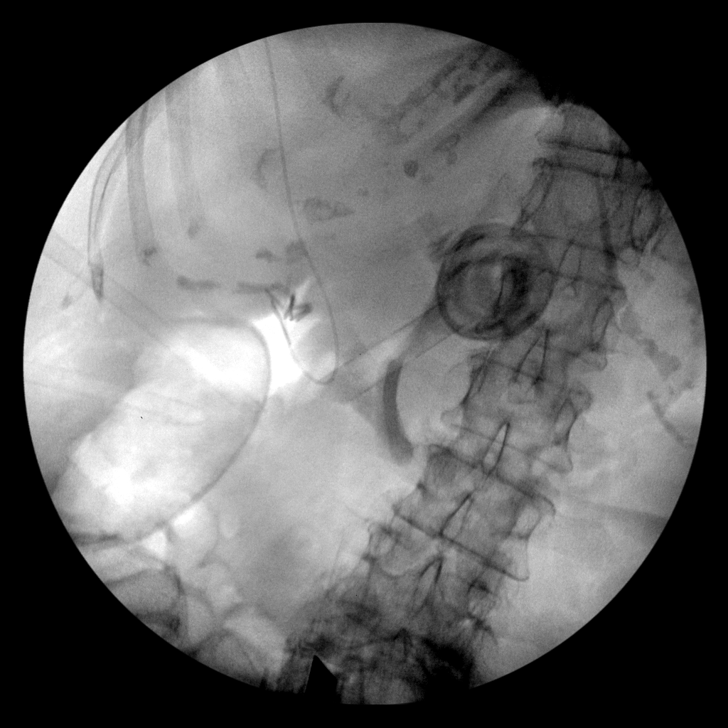
[im 1/3]
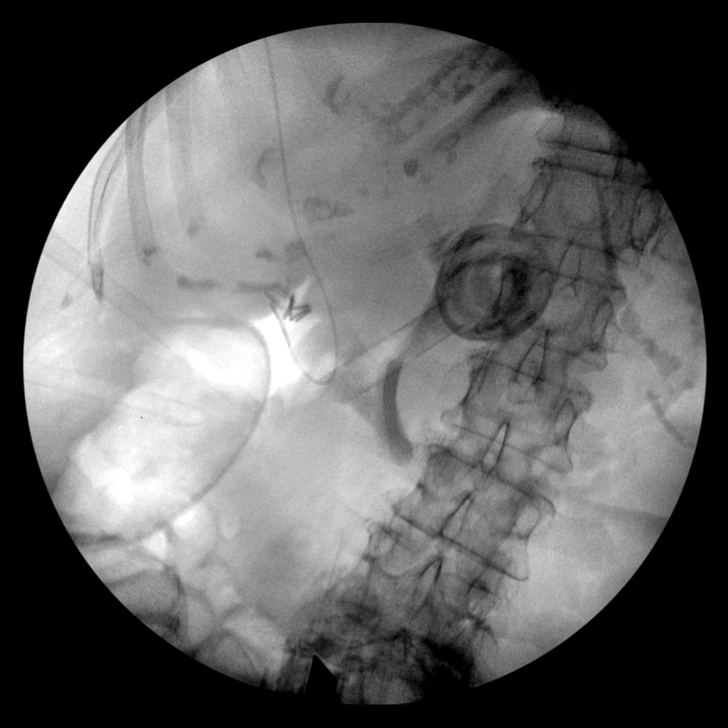
[im 2/3]
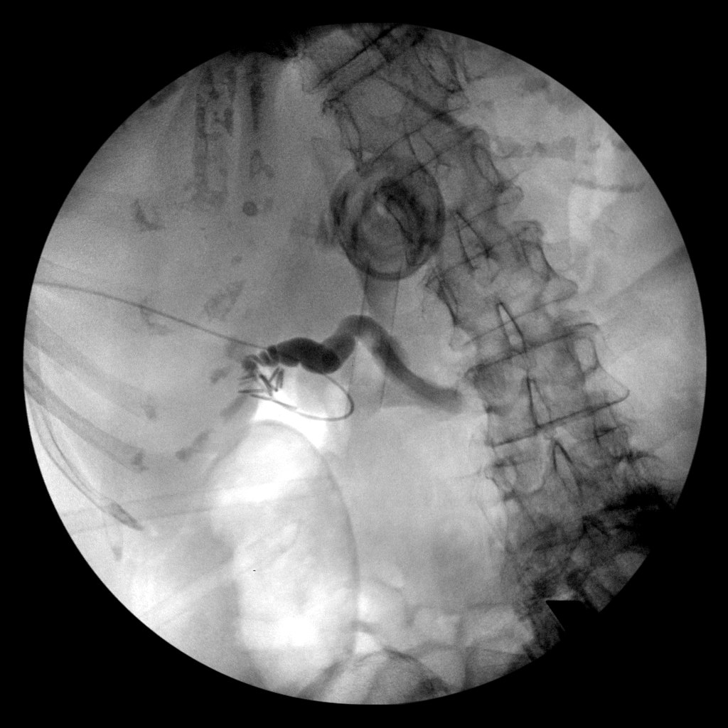
[im 2/3]
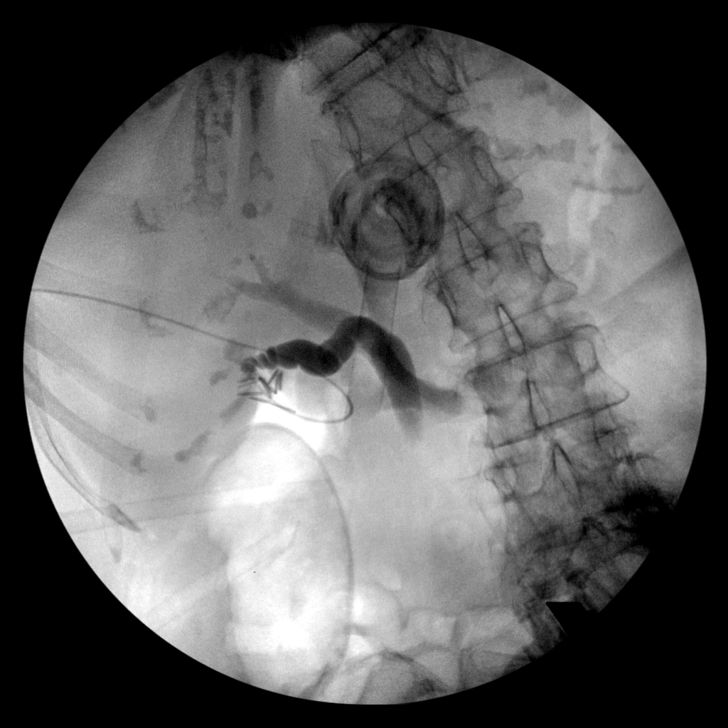
[im 2/3]
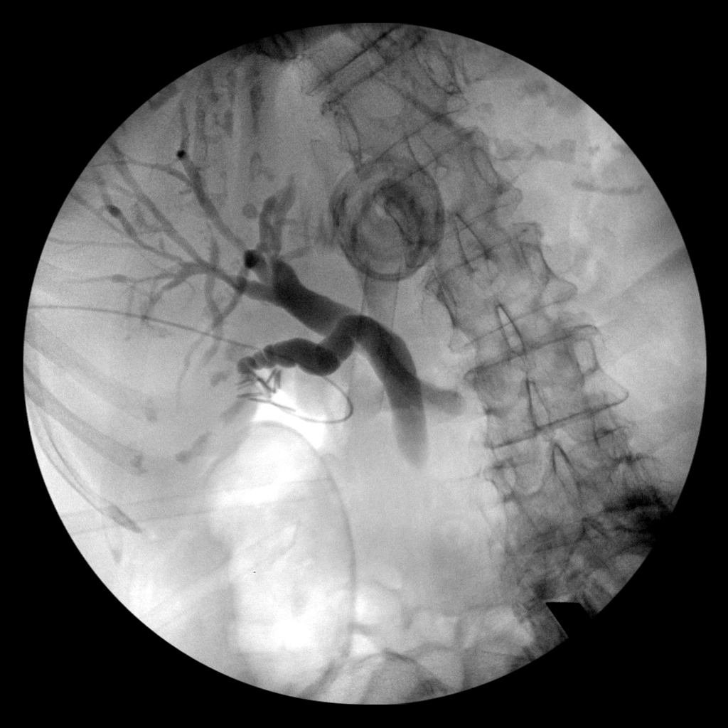
[im 2/3]
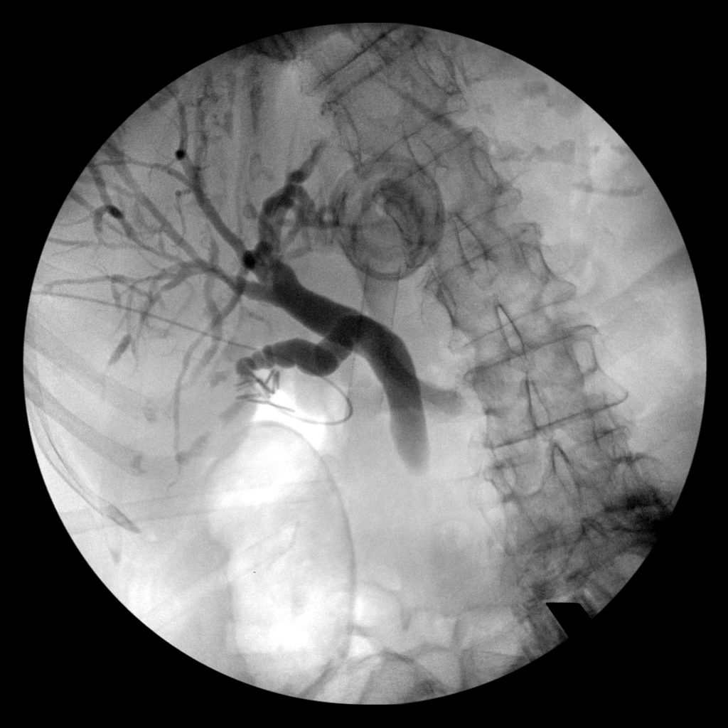
[im 3/3]
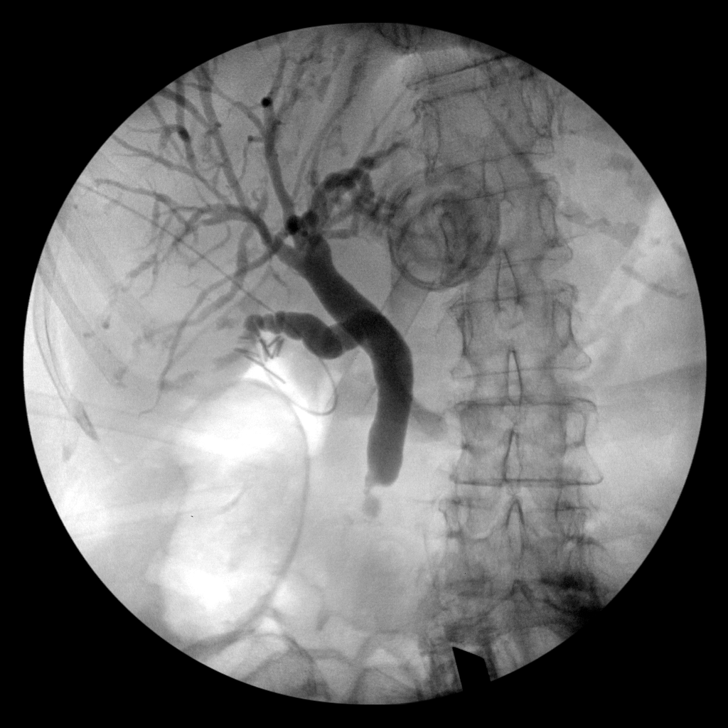
[im 3/3]
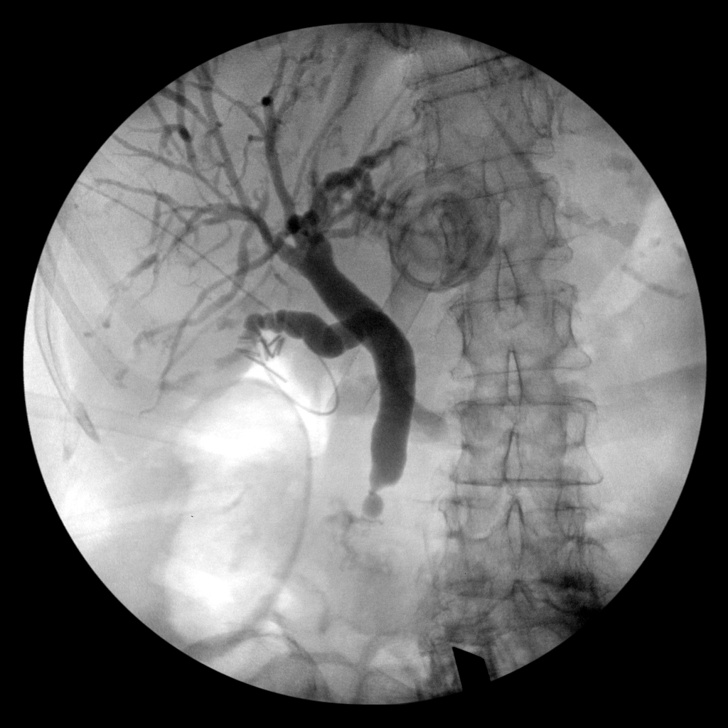
[im 3/3]
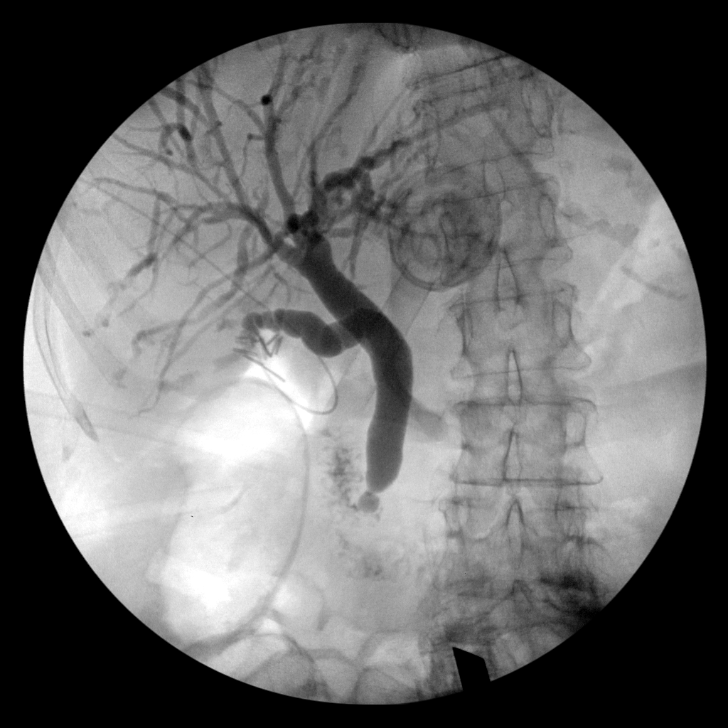
[im 3/3]
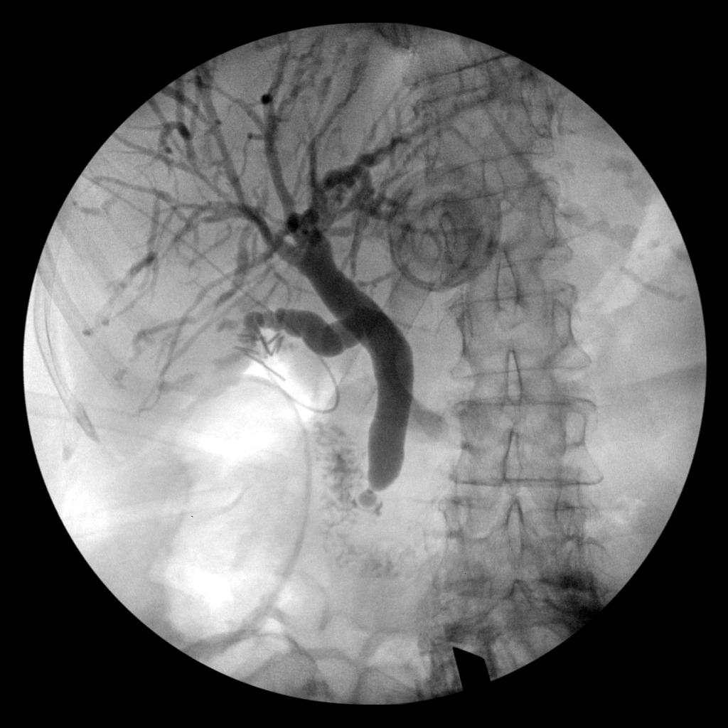

[11 of 11 positions shown; findings below may reference images not displayed]

FINDINGS: Contrast fills the biliary tree and duodenum without filling defects
in the common bile duct. The common bile duct is dilated. There is
circumferential narrowing of the distal common bile duct. This has
the appearance of a small focal stricture.
IMPRESSION: There is a focal stricture in the distal common bile duct. No duct
stones are identified.

## 2018-03-24 DIAGNOSIS — L531 Erythema annulare centrifugum: Secondary | ICD-10-CM | POA: Diagnosis not present

## 2018-03-28 ENCOUNTER — Other Ambulatory Visit: Payer: Self-pay | Admitting: Cardiology

## 2018-05-08 DIAGNOSIS — N390 Urinary tract infection, site not specified: Secondary | ICD-10-CM | POA: Diagnosis not present

## 2018-05-08 DIAGNOSIS — N183 Chronic kidney disease, stage 3 (moderate): Secondary | ICD-10-CM | POA: Diagnosis not present

## 2018-05-08 DIAGNOSIS — D631 Anemia in chronic kidney disease: Secondary | ICD-10-CM | POA: Diagnosis not present

## 2018-05-08 DIAGNOSIS — I10 Essential (primary) hypertension: Secondary | ICD-10-CM | POA: Diagnosis not present

## 2018-05-23 ENCOUNTER — Ambulatory Visit (INDEPENDENT_AMBULATORY_CARE_PROVIDER_SITE_OTHER): Payer: Medicare Other | Admitting: Family

## 2018-05-23 ENCOUNTER — Encounter: Payer: Self-pay | Admitting: Family

## 2018-05-23 ENCOUNTER — Ambulatory Visit (HOSPITAL_COMMUNITY)
Admission: RE | Admit: 2018-05-23 | Discharge: 2018-05-23 | Disposition: A | Discharge status: Home / Self Care | Payer: Medicare Other | Source: Ambulatory Visit | Attending: Family | Admitting: Family

## 2018-05-23 VITALS — BP 139/74 | HR 57 | Resp 18 | Ht 64.0 in | Wt 140.0 lb

## 2018-05-23 DIAGNOSIS — I6523 Occlusion and stenosis of bilateral carotid arteries: Secondary | ICD-10-CM

## 2018-05-23 DIAGNOSIS — R600 Localized edema: Secondary | ICD-10-CM | POA: Diagnosis not present

## 2018-05-23 NOTE — Progress Notes (Signed)
Chief Complaint: Follow up Extracranial Carotid Artery Stenosis   History of Present Illness  Wendy Walters is a 85 y.o. female whom Dr. Bridgett Larsson had been monitoring for carotid artery stenosis.  Previous carotid studies demonstrated: RICA 54-00% stenosis, LICA 86-76% stenosis.  She denies any known history of stroke or TIA. Specifically she deniesa history of amaurosis fugax or monocular blindness, unilateral facial drooping, hemiplegia, orreceptive or expressive aphasia.    The patient's risks factors for carotid disease include: HLD, CAD with prior MI, and prior smoker.  Since she had a cholecystectomy in 2018 she has found it difficult to get back into her walking 1.5 miles daily Pt denies claudication sx's with walking. She had OA in knees that she states is mild.   She was hospitalized in 2016 with dehydration and exacerbation of CKD. On 02-18-18 her GFR was 25, stage 4 CKD. She has had fluid retention in both entire legs since about 2005, has been progressively worsening, she has a dx of fibrillary glomerulonephritis.   Patient has not had previous carotid artery intervention.  At today's visit she denies chest pain or dyspnea, denies abdominal pain.   Diabetic: no Tobacco use: former smoker, quit in 1972, smoked x 20 years    Pt meds include: Statin : yes ASA: yes Other anticoagulants/antiplatelets: no   Past Medical History:  Diagnosis Date  . Anemia   . Bradycardia 08/22/2015  . Carotid artery occlusion   . Chronic kidney disease    stage 3  . Coronary artery disease    s/p PCI 2001 of lad in setting of AWMI-rotational atherectomy with BMS -Dr Radford Pax  . Edema extremities 08/21/2016  . Fibrillary glomerulonephritis    w nephrotic range protenuria, following w neurology, managed with an ACE-I and ARB  . H/O Doppler ultrasound 01/2003   ,39% bilateral carotid stenosis.  . Hyperlipidemia   . Hypertension   . Hypothyroidism   . Osteoporosis    s/p actonel  for about 7-8 years    Social History Social History   Tobacco Use  . Smoking status: Former Smoker    Last attempt to quit: 06/08/1970    Years since quitting: 47.9  . Smokeless tobacco: Never Used  . Tobacco comment: quit in 1972  Substance Use Topics  . Alcohol use: No    Comment: quit in her 17s  . Drug use: No    Family History Family History  Problem Relation Age of Onset  . Hypertension Mother   . Cancer - Prostate Father   . Cancer Father        Prostate  . Diabetes Daughter   . Hypertension Daughter     Surgical History Past Surgical History:  Procedure Laterality Date  . ANGIOPLASTY  2001  . CARDIAC CATHETERIZATION  2005   patent LAD stent  . CHOLECYSTECTOMY N/A 02/07/2017   Procedure: LAPAROSCOPIC CHOLECYSTECTOMY WITH INTRAOPERATIVE CHOLANGIOGRAM;  Surgeon: Armandina Gemma, MD;  Location: [facility];  Service: General;  Laterality: N/A;  . EYE SURGERY Bilateral 2012   Cataract  . TONSILLECTOMY  1942    Allergies  Allergen Reactions  . Codeine Nausea And Vomiting and Nausea Only    Current Outpatient Medications  Medication Sig Dispense Refill  . alendronate (FOSAMAX) 70 MG tablet Take 70 mg by mouth once a week. Take with a full glass of water on an empty stomach.    . Ascorbic Acid (VITAMIN C) 1000 MG tablet Take 1,000 mg by mouth daily.    Marland Kitchen  aspirin EC 81 MG tablet Take 1 tablet (81 mg total) by mouth daily. (Patient taking differently: Take 325 mg by mouth daily. ) 90 tablet 3  . atorvastatin (LIPITOR) 80 MG tablet Take 1 tablet (80 mg total) by mouth daily at 6 PM. 90 tablet 3  . B Complex Vitamins (VITAMIN B COMPLEX PO) Take 1 tablet by mouth daily.     . Coenzyme Q10 (COQ10) 200 MG CAPS Take 1 capsule by mouth daily.     Marland Kitchen diltiazem (CARDIZEM CD) 240 MG 24 hr capsule Take 240 mg by mouth daily.    Marland Kitchen ezetimibe (ZETIA) 10 MG tablet TAKE 1 TABLET EVERY DAY 90 tablet 1  . ferrous fumarate (HEMOCYTE - 106 MG FE) 325 (106 FE) MG TABS tablet Take 1 tablet by  mouth daily.    Marland Kitchen labetalol (NORMODYNE) 100 MG tablet Take 50 mg by mouth daily.    Marland Kitchen levothyroxine (SYNTHROID, LEVOTHROID) 88 MCG tablet Take 88 mcg by mouth daily before breakfast.    . lisinopril (PRINIVIL,ZESTRIL) 40 MG tablet Take 1 tablet (40 mg total) by mouth 2 (two) times daily. 30 tablet 0  . Multiple Vitamin (MULTIVITAMIN) tablet Take 1 tablet by mouth daily.    . niacin 500 MG tablet Take 500 mg by mouth 2 (two) times daily with a meal.    . Omega-3 Fatty Acids (OMEGA 3 PO) Take 1 capsule by mouth 2 (two) times daily.    . Probiotic Product (PROBIOTIC PO) Take 1 capsule by mouth every 3 (three) days.    . sodium bicarbonate 650 MG tablet Take 650 mg by mouth 2 (two) times daily.    Marland Kitchen torsemide (DEMADEX) 20 MG tablet Take 20 mg by mouth 2 (two) times daily.    Marland Kitchen zinc gluconate 50 MG tablet Take 50 mg by mouth daily.    . Calcium Carbonate-Vit D-Min (CALCIUM 1200 PO) Take 1 tablet by mouth daily.     No current facility-administered medications for this visit.     Review of Systems : See HPI for pertinent positives and negatives.  Physical Examination  Vitals:   05/23/18 1137 05/23/18 1140  BP: (!) 150/73 139/74  Pulse: (!) 57   Resp: 18   SpO2: 98%   Weight: 140 lb (63.5 kg)   Height: 5\' 4"  (1.626 m)    Body mass index is 24.03 kg/m.  General: WDWN female in NAD GAIT: normal Eyes: PERRLA HENT: No gross abnormalities.  Pulmonary:  Respirations are non-labored, fair air movement in all fields, CTAB, no rales, rhonchi, or wheezes. Cardiac: regular rhythm, no detected murmur.  VASCULAR EXAM Carotid Bruits Right Left   Negative Negative     Abdominal aortic pulse is not palpable. Radial pulses are 2+ palpable and equal.                                                                                                                            LE Pulses Right  Left       POPLITEAL  not palpable   not palpable       POSTERIOR TIBIAL  not palpable   not palpable         DORSALIS PEDIS      ANTERIOR TIBIAL 2+ palpable  2+ palpable     Gastrointestinal: soft, nontender, BS WNL, no r/g, no palpable masses. Musculoskeletal: no muscle atrophy/wasting. M/S 4/5 throughout, extremities without ischemic changes. Skin: No rashes, no ulcers, no cellulitis.  1+ pitting and non pitting edema in bilateral lower legs, ankles, and feet.  Neurologic:  A&O X 3; appropriate affect, sensation is normal; speech is normal, CN 2-12 intact, pain and light touch intact in extremities, motor exam as listed above. Psychiatric: Normal thought content, mood appropriate to clinical situation.    Assessment: Wendy Walters is a 85 y.o. female who has no history of stroke or TIA.  Fortunately she does not have DM and quit smoking in 1972. Her atherosclerotic risk factors include CAD, 20 year history of smoking (remote), and CKD.   Pt takes a daily 81 mg ASA and a statin.  Mild bilateral entire legs edema: elevation of her feet above her heart when not walking, she declines to wear thigh compression hose.     DATA  Carotid Duplex (05-23-18): 1-39% bilateral ICA stenosis. Bilateral vertebral artery flow is antegrade.  Bilateral subclavian artery waveforms are normal.  No significant change compared to the exams on 09-09-15 and 11-16-16.    Plan: Follow-up in 18 months with Carotid Duplex scan.   I discussed in depth with the patient the nature of atherosclerosis, and emphasized the importance of maximal medical management including strict control of blood pressure, blood glucose, and lipid levels, obtaining regular exercise, and continued cessation of smoking.  The patient is aware that without maximal medical management the underlying atherosclerotic disease process will progress, limiting the benefit of any interventions. The patient was given information about stroke prevention and what symptoms should prompt the patient to seek immediate medical care. Thank you for  allowing [place] to participate in this patient's care.  Clemon Chambers, RN, MSN, FNP-C [facility] Office: [phone]  Clinic Physician: Donzetta Matters  05/23/18 12:15 PM

## 2018-05-23 NOTE — Patient Instructions (Signed)

## 2018-05-27 DIAGNOSIS — I10 Essential (primary) hypertension: Secondary | ICD-10-CM | POA: Diagnosis not present

## 2018-05-27 DIAGNOSIS — D631 Anemia in chronic kidney disease: Secondary | ICD-10-CM | POA: Diagnosis not present

## 2018-05-27 DIAGNOSIS — N183 Chronic kidney disease, stage 3 (moderate): Secondary | ICD-10-CM | POA: Diagnosis not present

## 2018-05-29 DIAGNOSIS — D631 Anemia in chronic kidney disease: Secondary | ICD-10-CM | POA: Diagnosis not present

## 2018-05-29 DIAGNOSIS — I10 Essential (primary) hypertension: Secondary | ICD-10-CM | POA: Diagnosis not present

## 2018-05-29 DIAGNOSIS — N390 Urinary tract infection, site not specified: Secondary | ICD-10-CM | POA: Diagnosis not present

## 2018-05-29 DIAGNOSIS — N186 End stage renal disease: Secondary | ICD-10-CM | POA: Diagnosis not present

## 2018-06-25 DIAGNOSIS — D3131 Benign neoplasm of right choroid: Secondary | ICD-10-CM | POA: Diagnosis not present

## 2018-06-25 DIAGNOSIS — H35372 Puckering of macula, left eye: Secondary | ICD-10-CM | POA: Diagnosis not present

## 2018-06-25 DIAGNOSIS — H5211 Myopia, right eye: Secondary | ICD-10-CM | POA: Diagnosis not present

## 2018-06-25 DIAGNOSIS — H52221 Regular astigmatism, right eye: Secondary | ICD-10-CM | POA: Diagnosis not present

## 2018-06-25 DIAGNOSIS — H26493 Other secondary cataract, bilateral: Secondary | ICD-10-CM | POA: Diagnosis not present

## 2018-06-25 DIAGNOSIS — H524 Presbyopia: Secondary | ICD-10-CM | POA: Diagnosis not present

## 2018-06-25 DIAGNOSIS — H52222 Regular astigmatism, left eye: Secondary | ICD-10-CM | POA: Diagnosis not present

## 2018-07-22 DIAGNOSIS — M62830 Muscle spasm of back: Secondary | ICD-10-CM | POA: Diagnosis not present

## 2018-07-22 DIAGNOSIS — J40 Bronchitis, not specified as acute or chronic: Secondary | ICD-10-CM | POA: Diagnosis not present

## 2018-07-26 DIAGNOSIS — M25512 Pain in left shoulder: Secondary | ICD-10-CM | POA: Diagnosis not present

## 2018-08-01 DIAGNOSIS — R609 Edema, unspecified: Secondary | ICD-10-CM | POA: Diagnosis not present

## 2018-08-01 DIAGNOSIS — J40 Bronchitis, not specified as acute or chronic: Secondary | ICD-10-CM | POA: Diagnosis not present

## 2018-08-01 DIAGNOSIS — N183 Chronic kidney disease, stage 3 (moderate): Secondary | ICD-10-CM | POA: Diagnosis not present

## 2018-08-27 DIAGNOSIS — I10 Essential (primary) hypertension: Secondary | ICD-10-CM | POA: Diagnosis not present

## 2018-08-27 DIAGNOSIS — N390 Urinary tract infection, site not specified: Secondary | ICD-10-CM | POA: Diagnosis not present

## 2018-08-27 DIAGNOSIS — D631 Anemia in chronic kidney disease: Secondary | ICD-10-CM | POA: Diagnosis not present

## 2018-08-27 DIAGNOSIS — N183 Chronic kidney disease, stage 3 (moderate): Secondary | ICD-10-CM | POA: Diagnosis not present

## 2018-09-01 DIAGNOSIS — I872 Venous insufficiency (chronic) (peripheral): Secondary | ICD-10-CM | POA: Diagnosis not present

## 2018-09-03 DIAGNOSIS — N183 Chronic kidney disease, stage 3 (moderate): Secondary | ICD-10-CM | POA: Diagnosis not present

## 2018-09-03 DIAGNOSIS — I10 Essential (primary) hypertension: Secondary | ICD-10-CM | POA: Diagnosis not present

## 2018-09-03 DIAGNOSIS — D509 Iron deficiency anemia, unspecified: Secondary | ICD-10-CM | POA: Diagnosis not present

## 2018-09-03 DIAGNOSIS — D631 Anemia in chronic kidney disease: Secondary | ICD-10-CM | POA: Diagnosis not present

## 2018-09-03 DIAGNOSIS — D529 Folate deficiency anemia, unspecified: Secondary | ICD-10-CM | POA: Diagnosis not present

## 2018-09-03 DIAGNOSIS — D501 Sideropenic dysphagia: Secondary | ICD-10-CM | POA: Diagnosis not present

## 2018-09-03 DIAGNOSIS — D519 Vitamin B12 deficiency anemia, unspecified: Secondary | ICD-10-CM | POA: Diagnosis not present

## 2018-09-04 DIAGNOSIS — D631 Anemia in chronic kidney disease: Secondary | ICD-10-CM | POA: Diagnosis not present

## 2018-09-04 DIAGNOSIS — N183 Chronic kidney disease, stage 3 (moderate): Secondary | ICD-10-CM | POA: Diagnosis not present

## 2018-09-04 DIAGNOSIS — I10 Essential (primary) hypertension: Secondary | ICD-10-CM | POA: Diagnosis not present

## 2018-09-16 DIAGNOSIS — I10 Essential (primary) hypertension: Secondary | ICD-10-CM | POA: Diagnosis not present

## 2018-09-16 DIAGNOSIS — D631 Anemia in chronic kidney disease: Secondary | ICD-10-CM | POA: Diagnosis not present

## 2018-09-16 DIAGNOSIS — N183 Chronic kidney disease, stage 3 (moderate): Secondary | ICD-10-CM | POA: Diagnosis not present

## 2018-09-18 DIAGNOSIS — D631 Anemia in chronic kidney disease: Secondary | ICD-10-CM | POA: Diagnosis not present

## 2018-09-18 DIAGNOSIS — N183 Chronic kidney disease, stage 3 (moderate): Secondary | ICD-10-CM | POA: Diagnosis not present

## 2018-09-18 DIAGNOSIS — I10 Essential (primary) hypertension: Secondary | ICD-10-CM | POA: Diagnosis not present

## 2018-09-29 ENCOUNTER — Other Ambulatory Visit: Payer: Self-pay | Admitting: Cardiology

## 2018-10-08 NOTE — Progress Notes (Signed)
Virtual Visit via Telephone Note   This visit type was conducted due to national recommendations for restrictions regarding the COVID-19 Pandemic (e.g. social distancing) in an effort to limit this patient's exposure and mitigate transmission in our community.  Due to her co-morbid illnesses, this patient is at least at moderate risk for complications without adequate follow up.  This format is felt to be most appropriate for this patient at this time.  All issues noted in this document were discussed and addressed.  A limited physical exam was performed with this format.  Please refer to the patient's chart for her consent to telehealth for [facility].  Evaluation Performed:  Follow-up visit  This visit type was conducted due to national recommendations for restrictions regarding the COVID-19 Pandemic (e.g. social distancing).  This format is felt to be most appropriate for this patient at this time.  All issues noted in this document were discussed and addressed.  No physical exam was performed (except for noted visual exam findings with Video Visits).  Please refer to the patient's chart (MyChart message for video visits and phone note for telephone visits) for the patient's consent to telehealth for [facility].  Date:  7/0/0174   ID:  Wendy Walters, DOB [id number], MRN [id number]  Patient Location:  Home  Provider location:   [place]  PCP:  Leighton Ruff, MD  Cardiologist:  Fransico Him, MD Electrophysiologist:  None   Chief Complaint:  CAD, HTN and lipids  History of Present Illness:    Wendy Walters is a 85 y.o. female who presents via audio/video conferencing for a telehealth visit today.    Wendy Walters is a 85 y.o. female with a hx of ASCAD s/p PCI of LAD 2001 in setting of AWMI and cath 2005 with patent stent to the LAD, dyslipidemia, carotid artery stenosis and HTN.   She is here today for followup and is doing well.  She denies any chest pain or  pressure, SOB, DOE, PND, orthopnea, LE edema, dizziness, palpitations or syncope. She is compliant with her meds and is tolerating meds with no SE.    The patient does not have symptoms concerning for COVID-19 infection (fever, chills, cough, or new shortness of breath).    Prior CV studies:   The following studies were reviewed today:  none  Past Medical History:  Diagnosis Date  . Anemia   . Bradycardia 08/22/2015  . Carotid artery occlusion   . Chronic kidney disease    stage 3  . Coronary artery disease    s/p PCI 2001 of lad in setting of AWMI-rotational atherectomy with BMS -Dr Radford Pax  . Edema extremities 08/21/2016  . Fibrillary glomerulonephritis    w nephrotic range protenuria, following w neurology, managed with an ACE-I and ARB  . H/O Doppler ultrasound 01/2003   ,39% bilateral carotid stenosis.  . Hyperlipidemia   . Hypertension   . Hypothyroidism   . Osteoporosis    s/p actonel for about 7-8 years   Past Surgical History:  Procedure Laterality Date  . ANGIOPLASTY  2001  . CARDIAC CATHETERIZATION  2005   patent LAD stent  . CHOLECYSTECTOMY N/A 02/07/2017   Procedure: LAPAROSCOPIC CHOLECYSTECTOMY WITH INTRAOPERATIVE CHOLANGIOGRAM;  Surgeon: Armandina Gemma, MD;  Location: [facility];  Service: General;  Laterality: N/A;  . EYE SURGERY Bilateral 2012   Cataract  . TONSILLECTOMY  1942     Current Meds  Medication Sig  . alendronate (FOSAMAX) 70 MG tablet Take 70 mg by mouth  once a week. Take with a full glass of water on an empty stomach.  . Ascorbic Acid (VITAMIN C) 1000 MG tablet Take 1,000 mg by mouth daily.  Marland Kitchen aspirin EC 81 MG tablet Take 1 tablet (81 mg total) by mouth daily. (Patient taking differently: Take 325 mg by mouth daily. )  . atorvastatin (LIPITOR) 80 MG tablet Take 1 tablet (80 mg total) by mouth daily at 6 PM.  . B Complex Vitamins (VITAMIN B COMPLEX PO) Take 1 tablet by mouth daily.   . Coenzyme Q10 (COQ10) 200 MG CAPS Take 1 capsule by mouth daily.    Marland Kitchen diltiazem (CARDIZEM CD) 240 MG 24 hr capsule Take 240 mg by mouth daily.  Marland Kitchen ezetimibe (ZETIA) 10 MG tablet TAKE 1 TABLET EVERY DAY  . ferrous fumarate (HEMOCYTE - 106 MG FE) 325 (106 FE) MG TABS tablet Take 1 tablet by mouth daily.  Marland Kitchen labetalol (NORMODYNE) 100 MG tablet Take 50 mg by mouth 2 (two) times daily.   Marland Kitchen levothyroxine (SYNTHROID, LEVOTHROID) 88 MCG tablet Take 88 mcg by mouth daily before breakfast.  . lisinopril (PRINIVIL,ZESTRIL) 40 MG tablet Take 1 tablet (40 mg total) by mouth 2 (two) times daily.  . metolazone (ZAROXOLYN) 2.5 MG tablet Take 2.5 mg by mouth as directed. 1 tablet on Mondays  . Multiple Vitamin (MULTIVITAMIN) tablet Take 1 tablet by mouth daily.  . niacin 500 MG tablet Take 500 mg by mouth 2 (two) times daily with a meal.  . Omega-3 Fatty Acids (OMEGA 3 PO) Take 1 capsule by mouth 2 (two) times daily.  . Probiotic Product (PROBIOTIC PO) Take 1 capsule by mouth every 3 (three) days.  . sodium bicarbonate 650 MG tablet Take 650 mg by mouth 2 (two) times daily.  Marland Kitchen torsemide (DEMADEX) 20 MG tablet Take 20 mg by mouth 2 (two) times daily.  Marland Kitchen zinc gluconate 50 MG tablet Take 50 mg by mouth daily.     Allergies:   Codeine   Social History   Tobacco Use  . Smoking status: Former Smoker    Last attempt to quit: 06/08/1970    Years since quitting: 48.3  . Smokeless tobacco: Never Used  . Tobacco comment: quit in 1972  Substance Use Topics  . Alcohol use: No    Comment: quit in her 60s  . Drug use: No     Family Hx: The patient's family history includes Cancer in her father; Cancer - Prostate in her father; Diabetes in her daughter; Hypertension in her daughter and mother.  ROS:   Please see the history of present illness.     All other systems reviewed and are negative.   Labs/Other Tests and Data Reviewed:    Recent Labs: 10/09/2017: ALT 20   Recent Lipid Panel Lab Results  Component Value Date/Time   CHOL 160 10/09/2017 09:16 AM   TRIG 152 (H)  10/09/2017 09:16 AM   HDL 68 10/09/2017 09:16 AM   CHOLHDL 2.4 10/09/2017 09:16 AM   CHOLHDL 2.4 04/13/2016 09:35 AM   LDLCALC 62 10/09/2017 09:16 AM    Wt Readings from Last 3 Encounters:  10/09/18 138 lb (62.6 kg)  05/23/18 140 lb (63.5 kg)  10/09/17 132 lb 9.6 oz (60.1 kg)     Objective:    Vital Signs:  BP 129/70   Pulse 69   Ht 5\' 4"  (1.626 m)   Wt 138 lb (62.6 kg)   BMI 23.69 kg/m     ASSESSMENT & PLAN:  1.  ASCAD - s/p PCI of LAD 2001 in setting of AWMI and cath 2005 with patent stent to the LAD.  She denies any anginal sx of CP or SOB.  She will continue on ASA 325mg  daily and statin. Her nephrologist placed her on the higher dose of ASA.   2.  Hypertension - her BP is controlled.  She will continue on Cardizem CD 240mg  daily, Labetalol 50mg  BID and Lisinopril 40mg  BID.   3.  Bilateral carotid artery stenosis - dopplers 05/2018 showed 1-39% bilateral stenosis.  She will continue on ASA and statin.   4.  Hyperlipidemia - her LDL goal is < 70.  She will continue on Zetia 10mg  daily and atorvastatin 80mg  daily. Her LDL was 41 in 02/2018.   I will check an FLP and ALT in 02/2018.    5.  Chronic LE edema - this is well controlled with torsemide 20mg  BID and Zaroxolyn 2.5mg  on Mondays  6.  CKD stage 3 -this is followed by her nephrologist in [place]    COVID-19 Education: The signs and symptoms of COVID-19 were discussed with the patient and how to seek care for testing (follow up with PCP or arrange E-visit).  The importance of social distancing was discussed today.  Patient Risk:   After full review of this patient's clinical status, I feel that they are at least moderate risk at this time.  Time:   Today, I have spent 11 minutes directly with the patient on video discussing medical problems including CAD, HTN, lipids.  We also reviewed the symptoms of COVID 19 and the ways to protect against contracting the virus with telehealth technology.  I spent an additional 5  minutes reviewing patient's chart including Carotid dopplers, labs.  Medication Adjustments/Labs and Tests Ordered: Current medicines are reviewed at length with the patient today.  Concerns regarding medicines are outlined above.  Tests Ordered: No orders of the defined types were placed in this encounter.  Medication Changes: No orders of the defined types were placed in this encounter.   Disposition:  Follow up in 1 year(s)  Signed, Fransico Him, MD  10/09/2018 8:42 AM    [facility]

## 2018-10-09 ENCOUNTER — Ambulatory Visit: Payer: Medicare Other | Admitting: Cardiology

## 2018-10-09 ENCOUNTER — Telehealth (INDEPENDENT_AMBULATORY_CARE_PROVIDER_SITE_OTHER): Payer: Medicare Other | Admitting: Cardiology

## 2018-10-09 ENCOUNTER — Encounter: Payer: Self-pay | Admitting: Cardiology

## 2018-10-09 ENCOUNTER — Other Ambulatory Visit: Payer: Self-pay

## 2018-10-09 VITALS — BP 129/70 | HR 69 | Ht 64.0 in | Wt 138.0 lb

## 2018-10-09 DIAGNOSIS — Z7189 Other specified counseling: Secondary | ICD-10-CM

## 2018-10-09 DIAGNOSIS — E7800 Pure hypercholesterolemia, unspecified: Secondary | ICD-10-CM | POA: Diagnosis not present

## 2018-10-09 DIAGNOSIS — N183 Chronic kidney disease, stage 3 unspecified: Secondary | ICD-10-CM

## 2018-10-09 DIAGNOSIS — I2510 Atherosclerotic heart disease of native coronary artery without angina pectoris: Secondary | ICD-10-CM

## 2018-10-09 DIAGNOSIS — I6523 Occlusion and stenosis of bilateral carotid arteries: Secondary | ICD-10-CM | POA: Diagnosis not present

## 2018-10-09 DIAGNOSIS — R600 Localized edema: Secondary | ICD-10-CM

## 2018-10-09 DIAGNOSIS — I10 Essential (primary) hypertension: Secondary | ICD-10-CM | POA: Diagnosis not present

## 2018-10-09 NOTE — Patient Instructions (Addendum)
Medication Instructions:  Your provider recommends that you continue on your current medications as directed. Please refer to the Current Medication list given to you today.    Labwork: Your provider recommends that you return for FASTING lab work in October, 2020. Please call us to arrange your lab appointment.  Follow-Up: Your provider wants you to follow-up in: 1 year with Dr. Radford Pax. You will receive a reminder letter in the mail two months in advance. If you don't receive a letter, please call our office to schedule the follow-up appointment.

## 2018-10-16 DIAGNOSIS — D509 Iron deficiency anemia, unspecified: Secondary | ICD-10-CM | POA: Diagnosis not present

## 2018-10-16 DIAGNOSIS — D519 Vitamin B12 deficiency anemia, unspecified: Secondary | ICD-10-CM | POA: Diagnosis not present

## 2018-10-16 DIAGNOSIS — D529 Folate deficiency anemia, unspecified: Secondary | ICD-10-CM | POA: Diagnosis not present

## 2018-10-16 DIAGNOSIS — D631 Anemia in chronic kidney disease: Secondary | ICD-10-CM | POA: Diagnosis not present

## 2018-10-16 DIAGNOSIS — N183 Chronic kidney disease, stage 3 (moderate): Secondary | ICD-10-CM | POA: Diagnosis not present

## 2018-10-16 DIAGNOSIS — I10 Essential (primary) hypertension: Secondary | ICD-10-CM | POA: Diagnosis not present

## 2018-10-16 DIAGNOSIS — D649 Anemia, unspecified: Secondary | ICD-10-CM | POA: Diagnosis not present

## 2018-10-20 DIAGNOSIS — N390 Urinary tract infection, site not specified: Secondary | ICD-10-CM | POA: Diagnosis not present

## 2018-10-20 DIAGNOSIS — I10 Essential (primary) hypertension: Secondary | ICD-10-CM | POA: Diagnosis not present

## 2018-10-20 DIAGNOSIS — N183 Chronic kidney disease, stage 3 (moderate): Secondary | ICD-10-CM | POA: Diagnosis not present

## 2018-10-20 DIAGNOSIS — D631 Anemia in chronic kidney disease: Secondary | ICD-10-CM | POA: Diagnosis not present

## 2018-11-14 DIAGNOSIS — I10 Essential (primary) hypertension: Secondary | ICD-10-CM | POA: Diagnosis not present

## 2018-11-14 DIAGNOSIS — N184 Chronic kidney disease, stage 4 (severe): Secondary | ICD-10-CM | POA: Diagnosis not present

## 2018-11-14 DIAGNOSIS — N183 Chronic kidney disease, stage 3 (moderate): Secondary | ICD-10-CM | POA: Diagnosis not present

## 2018-11-14 DIAGNOSIS — D631 Anemia in chronic kidney disease: Secondary | ICD-10-CM | POA: Diagnosis not present

## 2018-11-17 DIAGNOSIS — N183 Chronic kidney disease, stage 3 (moderate): Secondary | ICD-10-CM | POA: Diagnosis not present

## 2018-11-17 DIAGNOSIS — N390 Urinary tract infection, site not specified: Secondary | ICD-10-CM | POA: Diagnosis not present

## 2018-11-17 DIAGNOSIS — I10 Essential (primary) hypertension: Secondary | ICD-10-CM | POA: Diagnosis not present

## 2018-11-17 DIAGNOSIS — D631 Anemia in chronic kidney disease: Secondary | ICD-10-CM | POA: Diagnosis not present

## 2018-12-03 DIAGNOSIS — D638 Anemia in other chronic diseases classified elsewhere: Secondary | ICD-10-CM | POA: Diagnosis not present

## 2018-12-03 DIAGNOSIS — M810 Age-related osteoporosis without current pathological fracture: Secondary | ICD-10-CM | POA: Diagnosis not present

## 2018-12-03 DIAGNOSIS — I252 Old myocardial infarction: Secondary | ICD-10-CM | POA: Diagnosis not present

## 2018-12-03 DIAGNOSIS — E039 Hypothyroidism, unspecified: Secondary | ICD-10-CM | POA: Diagnosis not present

## 2018-12-03 DIAGNOSIS — I2510 Atherosclerotic heart disease of native coronary artery without angina pectoris: Secondary | ICD-10-CM | POA: Diagnosis not present

## 2018-12-03 DIAGNOSIS — N059 Unspecified nephritic syndrome with unspecified morphologic changes: Secondary | ICD-10-CM | POA: Diagnosis not present

## 2018-12-03 DIAGNOSIS — N183 Chronic kidney disease, stage 3 (moderate): Secondary | ICD-10-CM | POA: Diagnosis not present

## 2018-12-03 DIAGNOSIS — E7800 Pure hypercholesterolemia, unspecified: Secondary | ICD-10-CM | POA: Diagnosis not present

## 2018-12-03 DIAGNOSIS — I10 Essential (primary) hypertension: Secondary | ICD-10-CM | POA: Diagnosis not present

## 2018-12-08 DIAGNOSIS — N059 Unspecified nephritic syndrome with unspecified morphologic changes: Secondary | ICD-10-CM | POA: Diagnosis not present

## 2018-12-08 DIAGNOSIS — E7800 Pure hypercholesterolemia, unspecified: Secondary | ICD-10-CM | POA: Diagnosis not present

## 2018-12-08 DIAGNOSIS — I252 Old myocardial infarction: Secondary | ICD-10-CM | POA: Diagnosis not present

## 2018-12-08 DIAGNOSIS — D638 Anemia in other chronic diseases classified elsewhere: Secondary | ICD-10-CM | POA: Diagnosis not present

## 2018-12-08 DIAGNOSIS — I2510 Atherosclerotic heart disease of native coronary artery without angina pectoris: Secondary | ICD-10-CM | POA: Diagnosis not present

## 2018-12-08 DIAGNOSIS — E039 Hypothyroidism, unspecified: Secondary | ICD-10-CM | POA: Diagnosis not present

## 2018-12-08 DIAGNOSIS — M810 Age-related osteoporosis without current pathological fracture: Secondary | ICD-10-CM | POA: Diagnosis not present

## 2018-12-08 DIAGNOSIS — N183 Chronic kidney disease, stage 3 (moderate): Secondary | ICD-10-CM | POA: Diagnosis not present

## 2018-12-08 DIAGNOSIS — I10 Essential (primary) hypertension: Secondary | ICD-10-CM | POA: Diagnosis not present

## 2018-12-26 DIAGNOSIS — N184 Chronic kidney disease, stage 4 (severe): Secondary | ICD-10-CM | POA: Diagnosis not present

## 2018-12-26 DIAGNOSIS — R809 Proteinuria, unspecified: Secondary | ICD-10-CM | POA: Diagnosis not present

## 2018-12-26 DIAGNOSIS — I10 Essential (primary) hypertension: Secondary | ICD-10-CM | POA: Diagnosis not present

## 2018-12-26 DIAGNOSIS — N183 Chronic kidney disease, stage 3 (moderate): Secondary | ICD-10-CM | POA: Diagnosis not present

## 2018-12-29 DIAGNOSIS — I10 Essential (primary) hypertension: Secondary | ICD-10-CM | POA: Diagnosis not present

## 2018-12-29 DIAGNOSIS — D631 Anemia in chronic kidney disease: Secondary | ICD-10-CM | POA: Diagnosis not present

## 2018-12-29 DIAGNOSIS — N183 Chronic kidney disease, stage 3 (moderate): Secondary | ICD-10-CM | POA: Diagnosis not present

## 2019-01-19 DIAGNOSIS — I872 Venous insufficiency (chronic) (peripheral): Secondary | ICD-10-CM | POA: Diagnosis not present

## 2019-01-19 DIAGNOSIS — L570 Actinic keratosis: Secondary | ICD-10-CM | POA: Diagnosis not present

## 2019-01-19 DIAGNOSIS — L821 Other seborrheic keratosis: Secondary | ICD-10-CM | POA: Diagnosis not present

## 2019-01-19 DIAGNOSIS — Z85828 Personal history of other malignant neoplasm of skin: Secondary | ICD-10-CM | POA: Diagnosis not present

## 2019-01-19 DIAGNOSIS — Z08 Encounter for follow-up examination after completed treatment for malignant neoplasm: Secondary | ICD-10-CM | POA: Diagnosis not present

## 2019-01-20 ENCOUNTER — Other Ambulatory Visit: Payer: Self-pay | Admitting: Cardiology

## 2019-02-09 ENCOUNTER — Other Ambulatory Visit: Payer: Self-pay

## 2019-02-09 ENCOUNTER — Other Ambulatory Visit: Payer: Medicare Other | Admitting: *Deleted

## 2019-02-09 DIAGNOSIS — I2510 Atherosclerotic heart disease of native coronary artery without angina pectoris: Secondary | ICD-10-CM

## 2019-02-09 LAB — LIPID PANEL
Chol/HDL Ratio: 2.1 ratio (ref 0.0–4.4)
Cholesterol, Total: 162 mg/dL (ref 100–199)
HDL: 78 mg/dL (ref >39)
LDL Chol Calc (NIH): 65 mg/dL (ref 0–99)
Triglycerides: 109 mg/dL (ref 0–149)
VLDL Cholesterol Cal: 19 mg/dL (ref 5–40)

## 2019-02-09 LAB — ALT: ALT: 20 IU/L (ref 0–32)

## 2019-02-18 DIAGNOSIS — E039 Hypothyroidism, unspecified: Secondary | ICD-10-CM | POA: Diagnosis not present

## 2019-02-18 DIAGNOSIS — N059 Unspecified nephritic syndrome with unspecified morphologic changes: Secondary | ICD-10-CM | POA: Diagnosis not present

## 2019-02-18 DIAGNOSIS — D638 Anemia in other chronic diseases classified elsewhere: Secondary | ICD-10-CM | POA: Diagnosis not present

## 2019-02-18 DIAGNOSIS — I10 Essential (primary) hypertension: Secondary | ICD-10-CM | POA: Diagnosis not present

## 2019-02-18 DIAGNOSIS — M810 Age-related osteoporosis without current pathological fracture: Secondary | ICD-10-CM | POA: Diagnosis not present

## 2019-02-18 DIAGNOSIS — I252 Old myocardial infarction: Secondary | ICD-10-CM | POA: Diagnosis not present

## 2019-02-18 DIAGNOSIS — I2510 Atherosclerotic heart disease of native coronary artery without angina pectoris: Secondary | ICD-10-CM | POA: Diagnosis not present

## 2019-02-18 DIAGNOSIS — E7800 Pure hypercholesterolemia, unspecified: Secondary | ICD-10-CM | POA: Diagnosis not present

## 2019-02-20 DIAGNOSIS — E039 Hypothyroidism, unspecified: Secondary | ICD-10-CM | POA: Diagnosis not present

## 2019-02-20 DIAGNOSIS — E7800 Pure hypercholesterolemia, unspecified: Secondary | ICD-10-CM | POA: Diagnosis not present

## 2019-02-20 DIAGNOSIS — N183 Chronic kidney disease, stage 3 unspecified: Secondary | ICD-10-CM | POA: Diagnosis not present

## 2019-02-20 DIAGNOSIS — I10 Essential (primary) hypertension: Secondary | ICD-10-CM | POA: Diagnosis not present

## 2019-02-20 DIAGNOSIS — I2510 Atherosclerotic heart disease of native coronary artery without angina pectoris: Secondary | ICD-10-CM | POA: Diagnosis not present

## 2019-02-20 DIAGNOSIS — E559 Vitamin D deficiency, unspecified: Secondary | ICD-10-CM | POA: Diagnosis not present

## 2019-02-20 DIAGNOSIS — D638 Anemia in other chronic diseases classified elsewhere: Secondary | ICD-10-CM | POA: Diagnosis not present

## 2019-02-24 DIAGNOSIS — Z23 Encounter for immunization: Secondary | ICD-10-CM | POA: Diagnosis not present

## 2019-03-26 DIAGNOSIS — N183 Chronic kidney disease, stage 3 unspecified: Secondary | ICD-10-CM | POA: Diagnosis not present

## 2019-03-26 DIAGNOSIS — R809 Proteinuria, unspecified: Secondary | ICD-10-CM | POA: Diagnosis not present

## 2019-03-26 DIAGNOSIS — I10 Essential (primary) hypertension: Secondary | ICD-10-CM | POA: Diagnosis not present

## 2019-03-30 DIAGNOSIS — N183 Chronic kidney disease, stage 3 unspecified: Secondary | ICD-10-CM | POA: Diagnosis not present

## 2019-03-30 DIAGNOSIS — N390 Urinary tract infection, site not specified: Secondary | ICD-10-CM | POA: Diagnosis not present

## 2019-03-30 DIAGNOSIS — R809 Proteinuria, unspecified: Secondary | ICD-10-CM | POA: Diagnosis not present

## 2019-03-30 DIAGNOSIS — I10 Essential (primary) hypertension: Secondary | ICD-10-CM | POA: Diagnosis not present

## 2019-03-31 ENCOUNTER — Other Ambulatory Visit: Payer: Self-pay | Admitting: Cardiology

## 2019-04-16 ENCOUNTER — Other Ambulatory Visit: Payer: Self-pay

## 2019-04-16 ENCOUNTER — Emergency Department (HOSPITAL_COMMUNITY): Payer: Medicare Other

## 2019-04-16 ENCOUNTER — Emergency Department (HOSPITAL_COMMUNITY)
Admission: EM | Admit: 2019-04-16 | Discharge: 2019-04-16 | Discharge status: Against Medical Advice | Payer: Medicare Other | Attending: Emergency Medicine | Admitting: Emergency Medicine

## 2019-04-16 ENCOUNTER — Encounter (HOSPITAL_COMMUNITY): Payer: Self-pay | Admitting: Emergency Medicine

## 2019-04-16 DIAGNOSIS — I2510 Atherosclerotic heart disease of native coronary artery without angina pectoris: Secondary | ICD-10-CM | POA: Diagnosis not present

## 2019-04-16 DIAGNOSIS — E039 Hypothyroidism, unspecified: Secondary | ICD-10-CM | POA: Diagnosis not present

## 2019-04-16 DIAGNOSIS — R0602 Shortness of breath: Secondary | ICD-10-CM | POA: Diagnosis not present

## 2019-04-16 DIAGNOSIS — Z7982 Long term (current) use of aspirin: Secondary | ICD-10-CM | POA: Diagnosis not present

## 2019-04-16 DIAGNOSIS — Z87891 Personal history of nicotine dependence: Secondary | ICD-10-CM | POA: Insufficient documentation

## 2019-04-16 DIAGNOSIS — Z20828 Contact with and (suspected) exposure to other viral communicable diseases: Secondary | ICD-10-CM | POA: Diagnosis not present

## 2019-04-16 DIAGNOSIS — Z79899 Other long term (current) drug therapy: Secondary | ICD-10-CM | POA: Diagnosis not present

## 2019-04-16 DIAGNOSIS — I129 Hypertensive chronic kidney disease with stage 1 through stage 4 chronic kidney disease, or unspecified chronic kidney disease: Secondary | ICD-10-CM | POA: Diagnosis not present

## 2019-04-16 DIAGNOSIS — N183 Chronic kidney disease, stage 3 unspecified: Secondary | ICD-10-CM | POA: Diagnosis not present

## 2019-04-16 LAB — CBC WITH DIFFERENTIAL/PLATELET
Abs Immature Granulocytes: 0.02 10*3/uL (ref 0.00–0.07)
Basophils Absolute: 0.1 10*3/uL (ref 0.0–0.1)
Basophils Relative: 1 %
Eosinophils Absolute: 0.3 10*3/uL (ref 0.0–0.5)
Eosinophils Relative: 4 %
HCT: 25.4 % — ABNORMAL LOW (ref 36.0–46.0)
Hemoglobin: 7.9 g/dL — ABNORMAL LOW (ref 12.0–15.0)
Immature Granulocytes: 0 %
Lymphocytes Relative: 14 %
Lymphs Abs: 1 10*3/uL (ref 0.7–4.0)
MCH: 29.9 pg (ref 26.0–34.0)
MCHC: 31.1 g/dL (ref 30.0–36.0)
MCV: 96.2 fL (ref 80.0–100.0)
Monocytes Absolute: 0.7 10*3/uL (ref 0.1–1.0)
Monocytes Relative: 9 %
Neutro Abs: 5.2 10*3/uL (ref 1.7–7.7)
Neutrophils Relative %: 72 %
Platelets: 337 10*3/uL (ref 150–400)
RBC: 2.64 MIL/uL — ABNORMAL LOW (ref 3.87–5.11)
RDW: 14 % (ref 11.5–15.5)
WBC: 7.3 10*3/uL (ref 4.0–10.5)
nRBC: 0 % (ref 0.0–0.2)

## 2019-04-16 LAB — BASIC METABOLIC PANEL
Anion gap: 11 (ref 5–15)
BUN: 46 mg/dL — ABNORMAL HIGH (ref 8–23)
CO2: 19 mmol/L — ABNORMAL LOW (ref 22–32)
Calcium: 8.8 mg/dL — ABNORMAL LOW (ref 8.9–10.3)
Chloride: 111 mmol/L (ref 98–111)
Creatinine, Ser: 2.53 mg/dL — ABNORMAL HIGH (ref 0.44–1.00)
GFR calc Af Amer: 20 mL/min — ABNORMAL LOW (ref >60)
GFR calc non Af Amer: 17 mL/min — ABNORMAL LOW (ref >60)
Glucose, Bld: 89 mg/dL (ref 70–99)
Potassium: 4.5 mmol/L (ref 3.5–5.1)
Sodium: 141 mmol/L (ref 135–145)

## 2019-04-16 LAB — POC SARS CORONAVIRUS 2 AG -  ED: SARS Coronavirus 2 Ag: NEGATIVE

## 2019-04-16 LAB — BRAIN NATRIURETIC PEPTIDE: B Natriuretic Peptide: 1103.4 pg/mL — ABNORMAL HIGH (ref 0.0–100.0)

## 2019-04-16 LAB — TROPONIN I (HIGH SENSITIVITY): Troponin I (High Sensitivity): 35 ng/L — ABNORMAL HIGH (ref <18)

## 2019-04-16 NOTE — Discharge Instructions (Signed)
You left the emergency department [place] prior to work-up being complete.  Explained there are many potential life-threatening medical conditions that could be causing her shortness of breath.  Advised to return to the ED or call back as soon as possible.  Please call your primary care doctor soon as possible.

## 2019-04-16 NOTE — ED Provider Notes (Signed)
[facility] DEPT Provider Note   CSN: [id number] Arrival date & time: 04/16/19  1930     History Chief Complaint  Patient presents with  . Cough    Wendy Walters is a 85 y.o. female history of chronic kidney disease, hypertension, hyperlipidemia, fibrillary glomerulonephritis, coming to the emergency department with shortness of breath and cough.  Patient reports that she woke up feeling more short of breath than usual this morning.  She has had a persistent cough since this morning.  She states she was otherwise in her usual state of health yesterday.  She denies chest pain, chest pressure, lightheadedness, or palpitations.  She has chronic lower extremity edema due to her kidney disease, does not believe this is changed recently.  She denies any hx of DVT or PE.  Denies hx of CHF.   Denies fevers, chills, sick contacts in the house  HPI     Past Medical History:  Diagnosis Date  . Anemia   . Bradycardia 08/22/2015  . Carotid stenosis    1-39% stenosis  . Chronic kidney disease    stage 3  . Coronary artery disease    s/p PCI 2001 of lad in setting of AWMI-rotational atherectomy with BMS -Dr Radford Pax  . Edema extremities 08/21/2016  . Fibrillary glomerulonephritis    w nephrotic range protenuria, following w neurology, managed with an ACE-I and ARB  . H/O Doppler ultrasound 01/2003   ,39% bilateral carotid stenosis.  . Hyperlipidemia   . Hypertension   . Hypothyroidism   . Osteoporosis    s/p actonel for about 7-8 years    Patient Active Problem List   Diagnosis Date Noted  . Gallstone pancreatitis 02/04/2017  . CKD (chronic kidney disease) stage 3, GFR 30-59 ml/min 02/04/2017  . Anemia of chronic disease 02/04/2017  . Edema extremities 08/21/2016  . Bradycardia 08/22/2015  . Carotid artery disease ([place]) 03/02/2015  . Coronary atherosclerosis of native coronary artery 07/30/2013  . Pure hypercholesterolemia 07/30/2013  . Essential  hypertension, benign 07/30/2013    Past Surgical History:  Procedure Laterality Date  . ANGIOPLASTY  2001  . CARDIAC CATHETERIZATION  2005   patent LAD stent  . CHOLECYSTECTOMY N/A 02/07/2017   Procedure: LAPAROSCOPIC CHOLECYSTECTOMY WITH INTRAOPERATIVE CHOLANGIOGRAM;  Surgeon: Armandina Gemma, MD;  Location: [facility]S;  Service: General;  Laterality: N/A;  . EYE SURGERY Bilateral 2012   Cataract  . TONSILLECTOMY  1942     OB History   No obstetric history on file.     Family History  Problem Relation Age of Onset  . Hypertension Mother   . Cancer - Prostate Father   . Cancer Father        Prostate  . Diabetes Daughter   . Hypertension Daughter     Social History   Tobacco Use  . Smoking status: Former Smoker    Quit date: 06/08/1970    Years since quitting: 48.8  . Smokeless tobacco: Never Used  . Tobacco comment: quit in 1972  Substance Use Topics  . Alcohol use: No    Comment: quit in her 5s  . Drug use: No    Home Medications Prior to Admission medications   Medication Sig Start Date End Date Taking? Authorizing Provider  alendronate (FOSAMAX) 70 MG tablet Take 70 mg by mouth once a week. Take with a full glass of water on an empty stomach.    [provider]  Ascorbic Acid (VITAMIN C) 1000 MG tablet Take 1,000  mg by mouth daily.    [provider]  aspirin EC 81 MG tablet Take 1 tablet (81 mg total) by mouth daily. Patient taking differently: Take 325 mg by mouth daily.  10/09/17   Sueanne Margarita, MD  atorvastatin (LIPITOR) 80 MG tablet TAKE 1 TABLET DAILY AT 6 PM. 01/20/19   Turner, Eber Hong, MD  B Complex Vitamins (VITAMIN B COMPLEX PO) Take 1 tablet by mouth daily.     [provider]  Coenzyme Q10 (COQ10) 200 MG CAPS Take 1 capsule by mouth daily.     [provider]  diltiazem (CARDIZEM CD) 240 MG 24 hr capsule Take 240 mg by mouth daily. 08/08/16   [provider]  ezetimibe (ZETIA) 10 MG tablet TAKE 1 TABLET EVERY DAY  03/31/19   Sueanne Margarita, MD  ferrous fumarate (HEMOCYTE - 106 MG FE) 325 (106 FE) MG TABS tablet Take 1 tablet by mouth daily.    [provider]  labetalol (NORMODYNE) 100 MG tablet Take 50 mg by mouth 2 (two) times daily.     [provider]  levothyroxine (SYNTHROID, LEVOTHROID) 88 MCG tablet Take 88 mcg by mouth daily before breakfast.    [provider]  lisinopril (PRINIVIL,ZESTRIL) 40 MG tablet Take 1 tablet (40 mg total) by mouth 2 (two) times daily. 02/08/17   Florencia Reasons, MD  metolazone (ZAROXOLYN) 2.5 MG tablet Take 2.5 mg by mouth as directed. 1 tablet on Mondays 08/27/18   [provider]  Multiple Vitamin (MULTIVITAMIN) tablet Take 1 tablet by mouth daily.    [provider]  niacin 500 MG tablet Take 500 mg by mouth 2 (two) times daily with a meal.    [provider]  Omega-3 Fatty Acids (OMEGA 3 PO) Take 1 capsule by mouth 2 (two) times daily.    [provider]  Probiotic Product (PROBIOTIC PO) Take 1 capsule by mouth every 3 (three) days.    [provider]  sodium bicarbonate 650 MG tablet Take 650 mg by mouth 2 (two) times daily.    [provider]  torsemide (DEMADEX) 20 MG tablet Take 20 mg by mouth 2 (two) times daily.    [provider]  zinc gluconate 50 MG tablet Take 50 mg by mouth daily.    [provider]    Allergies    Codeine  Review of Systems   Review of Systems  Constitutional: Negative for chills and fever.  Eyes: Negative for pain and visual disturbance.  Respiratory: Positive for cough and shortness of breath.   Cardiovascular: Positive for leg swelling. Negative for chest pain and palpitations.  Gastrointestinal: Negative for abdominal pain, nausea and vomiting.  Neurological: Negative for syncope and light-headedness.  Psychiatric/Behavioral: Negative for agitation and confusion.  All other systems reviewed and are negative.   Physical Exam Updated  Vital Signs BP (!) 192/94 (BP Location: Left Arm)   Pulse 84   Temp 99.1 F (37.3 C) (Oral)   Resp 19   Ht 5\' 4"  (1.626 m)   Wt 66.2 kg   SpO2 95%   BMI 25.06 kg/m   Physical Exam Vitals and nursing note reviewed.  Constitutional:      General: She is not in acute distress.    Appearance: She is well-developed.  HENT:     Head: Normocephalic and atraumatic.  Eyes:     Conjunctiva/sclera: Conjunctivae normal.  Cardiovascular:     Rate and Rhythm: Normal rate and regular  rhythm.     Pulses: Normal pulses.  Pulmonary:     Effort: Pulmonary effort is normal. No respiratory distress.     Comments: 100% on room air Crackles in bilateral lung bases RR 30, some tachypnea No accessory muscle usage Speaking comfortably in full sentences Musculoskeletal:     Cervical back: Neck supple.     Comments: Bilateral symmetrical pitting edema to the mid-thigh (chronic and unchanged per patient's report)  Skin:    General: Skin is warm and dry.  Neurological:     General: No focal deficit present.     Mental Status: She is alert and oriented to person, place, and time.     ED Results / Procedures / Treatments   Labs (all labs ordered are listed, but only abnormal results are displayed) Labs Reviewed - No data to display  EKG EKG Interpretation  Date/Time:  Thursday April 16 2019 19:41:03 EST Ventricular Rate:  77 PR Interval:  182 QRS Duration: 80 QT Interval:  382 QTC Calculation: 432 R Axis:   2 Text Interpretation: Normal sinus rhythm Low voltage QRS No STEMI Confirmed by Octaviano Glow [phone]) on 04/16/2019 8:23:02 PM   Radiology DG Chest 2 View  Result Date: 04/16/2019 CLINICAL DATA:  Shortness of breath, cough and shortness of breath, denies chest pain fever EXAM: CHEST - 2 VIEW COMPARISON:  Radiograph 02/27/2017, CT 03/05/2015 FINDINGS: Diffuse hazy interstitial opacities with some fissural and septal thickening, cephalized vascularity and central vascular  cuffing. Bilateral effusions. No pneumothorax. There is borderline cardiomegaly. The aorta is calcified. Degenerative changes are present in the imaged spine and shoulders. High-riding appearance of the humeral heads may reflect underlying rotator cuff tendinopathy. No acute osseous or soft tissue abnormality. IMPRESSION: 1. Findings compatible with CHF including bilateral effusions, features of edema, and mild cardiomegaly. 2.  Aortic Atherosclerosis (ICD10-I70.0). Electronically Signed   By: Lovena Le M.D.   On: 04/16/2019 20:03    Procedures Procedures (including critical care time)  Medications Ordered in ED Medications - No data to display  ED Course  I have reviewed the triage vital signs and the nursing notes.  Pertinent labs & imaging results that were available during my care of the patient were reviewed by me and considered in my medical decision making (see chart for details).  This is an 85 yo female presenting to the ED with shortness of breath ongoing for 1 day.  Here she is mildly tachypneic but otherwise not in significant respiratory distress.  She is stable on room air.  She has no active complaints while lying on the stretcher, but her history of abrupt SOB is concerning.  Labs were ordered at intake and are pending  DDx includes heart failure (new onset) vs. ACS vs. PE vs. PNA vs. COVID-19 viral infection vs. Anemia vs. other    Clinical Course as of Apr 16 1214  Thu Apr 16, 2019  2155 Called to the room, patient wishes to leave.  Her daughter says the need to leave immediately.  The patient is extremely upset emotionally cannot stand being in the ED any longer.  Explained medical work-up is incomplete at this time and there may be something serious going on.  They will sign out AMA.   [MT]    Clinical Course User Index [MT] Montie Gelardi, Carola Rhine, MD    Final Clinical Impression(s) / ED Diagnoses Final diagnoses:  None    Rx / DC Orders ED Discharge Orders     None  Wyvonnia Dusky, MD 04/17/19 1228

## 2019-04-16 NOTE — ED Notes (Signed)
Patient requesting to leave AMA, MD made aware. Patient was educated on current condition and diagnoses.

## 2019-04-16 NOTE — ED Triage Notes (Signed)
Patient BIB daughter reports cough and SOB today. Denies chest pain, fever, N/V/D.

## 2019-04-17 NOTE — ED Provider Notes (Signed)
I called back the patient's daughter Fraser Din, who is her care-taker, this morning to discuss the patient's concerning lab results.  I explained I have a very high concern for a cardiac event, including new onset heart failure, based on her lab values (which resulted after she had been discharged).  I recommended that her mother be brought back to the ED immediately, and explained she needs a cardiac evaluation and monitoring in the hospital.  Her kidney function also appears to have worsened since her last test.    Fraser Din explained to me that the patient's PCP had already called them this morning and given the same warning and urgent referral to the ED.  However the patient is refusing to come back to the hospital.  Fraser Din tells me that her mother had a severe anxiety or panic attack yesterday and will not come back in.  She understands the situation and the risk of staying home, and she understands that this is a condition that could lead to her death.  The patient appears to have her full faculties and is capable of making this decision at this time.   Wyvonnia Dusky, MD 04/17/19 [phone]

## 2019-04-20 DIAGNOSIS — D631 Anemia in chronic kidney disease: Secondary | ICD-10-CM | POA: Diagnosis not present

## 2019-04-20 DIAGNOSIS — N183 Chronic kidney disease, stage 3 unspecified: Secondary | ICD-10-CM | POA: Diagnosis not present

## 2019-04-20 DIAGNOSIS — I10 Essential (primary) hypertension: Secondary | ICD-10-CM | POA: Diagnosis not present

## 2019-04-20 DIAGNOSIS — N390 Urinary tract infection, site not specified: Secondary | ICD-10-CM | POA: Diagnosis not present

## 2019-04-22 ENCOUNTER — Other Ambulatory Visit: Payer: Self-pay

## 2019-04-22 ENCOUNTER — Inpatient Hospital Stay (HOSPITAL_COMMUNITY)
Admission: EM | Admit: 2019-04-22 | Discharge: 2019-04-25 | DRG: 291 | Disposition: A | Discharge status: Home / Self Care | Payer: Medicare Other | Attending: Internal Medicine | Admitting: Internal Medicine

## 2019-04-22 ENCOUNTER — Emergency Department (HOSPITAL_COMMUNITY): Payer: Medicare Other

## 2019-04-22 ENCOUNTER — Encounter (HOSPITAL_COMMUNITY): Payer: Self-pay | Admitting: Emergency Medicine

## 2019-04-22 DIAGNOSIS — N184 Chronic kidney disease, stage 4 (severe): Secondary | ICD-10-CM | POA: Diagnosis not present

## 2019-04-22 DIAGNOSIS — Z8249 Family history of ischemic heart disease and other diseases of the circulatory system: Secondary | ICD-10-CM

## 2019-04-22 DIAGNOSIS — Z79899 Other long term (current) drug therapy: Secondary | ICD-10-CM

## 2019-04-22 DIAGNOSIS — Z7983 Long term (current) use of bisphosphonates: Secondary | ICD-10-CM

## 2019-04-22 DIAGNOSIS — N183 Chronic kidney disease, stage 3 unspecified: Secondary | ICD-10-CM | POA: Diagnosis present

## 2019-04-22 DIAGNOSIS — Z20828 Contact with and (suspected) exposure to other viral communicable diseases: Secondary | ICD-10-CM | POA: Diagnosis not present

## 2019-04-22 DIAGNOSIS — E7800 Pure hypercholesterolemia, unspecified: Secondary | ICD-10-CM | POA: Diagnosis present

## 2019-04-22 DIAGNOSIS — I2510 Atherosclerotic heart disease of native coronary artery without angina pectoris: Secondary | ICD-10-CM | POA: Diagnosis not present

## 2019-04-22 DIAGNOSIS — I5031 Acute diastolic (congestive) heart failure: Secondary | ICD-10-CM | POA: Diagnosis present

## 2019-04-22 DIAGNOSIS — I130 Hypertensive heart and chronic kidney disease with heart failure and stage 1 through stage 4 chronic kidney disease, or unspecified chronic kidney disease: Principal | ICD-10-CM | POA: Diagnosis present

## 2019-04-22 DIAGNOSIS — E785 Hyperlipidemia, unspecified: Secondary | ICD-10-CM | POA: Diagnosis not present

## 2019-04-22 DIAGNOSIS — I120 Hypertensive chronic kidney disease with stage 5 chronic kidney disease or end stage renal disease: Secondary | ICD-10-CM | POA: Diagnosis not present

## 2019-04-22 DIAGNOSIS — I509 Heart failure, unspecified: Secondary | ICD-10-CM | POA: Diagnosis not present

## 2019-04-22 DIAGNOSIS — R809 Proteinuria, unspecified: Secondary | ICD-10-CM | POA: Diagnosis present

## 2019-04-22 DIAGNOSIS — Z87891 Personal history of nicotine dependence: Secondary | ICD-10-CM

## 2019-04-22 DIAGNOSIS — R0600 Dyspnea, unspecified: Secondary | ICD-10-CM | POA: Diagnosis present

## 2019-04-22 DIAGNOSIS — M810 Age-related osteoporosis without current pathological fracture: Secondary | ICD-10-CM | POA: Diagnosis present

## 2019-04-22 DIAGNOSIS — E039 Hypothyroidism, unspecified: Secondary | ICD-10-CM | POA: Diagnosis present

## 2019-04-22 DIAGNOSIS — Z7982 Long term (current) use of aspirin: Secondary | ICD-10-CM

## 2019-04-22 DIAGNOSIS — I10 Essential (primary) hypertension: Secondary | ICD-10-CM | POA: Diagnosis present

## 2019-04-22 DIAGNOSIS — Z885 Allergy status to narcotic agent status: Secondary | ICD-10-CM

## 2019-04-22 DIAGNOSIS — Z91048 Other nonmedicinal substance allergy status: Secondary | ICD-10-CM

## 2019-04-22 DIAGNOSIS — R0602 Shortness of breath: Secondary | ICD-10-CM | POA: Diagnosis not present

## 2019-04-22 DIAGNOSIS — Z955 Presence of coronary angioplasty implant and graft: Secondary | ICD-10-CM

## 2019-04-22 LAB — TROPONIN I (HIGH SENSITIVITY)
Troponin I (High Sensitivity): 21 ng/L — ABNORMAL HIGH (ref <18)
Troponin I (High Sensitivity): 22 ng/L — ABNORMAL HIGH (ref <18)

## 2019-04-22 LAB — URINALYSIS, ROUTINE W REFLEX MICROSCOPIC
Bilirubin Urine: NEGATIVE
Glucose, UA: NEGATIVE mg/dL
Hgb urine dipstick: NEGATIVE
Ketones, ur: NEGATIVE mg/dL
Leukocytes,Ua: NEGATIVE
Nitrite: NEGATIVE
Protein, ur: 100 mg/dL — AB
Specific Gravity, Urine: 1.01 (ref 1.005–1.030)
pH: 5 (ref 5.0–8.0)

## 2019-04-22 LAB — COMPREHENSIVE METABOLIC PANEL
ALT: 19 U/L (ref 0–44)
AST: 22 U/L (ref 15–41)
Albumin: 2.5 g/dL — ABNORMAL LOW (ref 3.5–5.0)
Alkaline Phosphatase: 79 U/L (ref 38–126)
Anion gap: 8 (ref 5–15)
BUN: 45 mg/dL — ABNORMAL HIGH (ref 8–23)
CO2: 22 mmol/L (ref 22–32)
Calcium: 8.7 mg/dL — ABNORMAL LOW (ref 8.9–10.3)
Chloride: 110 mmol/L (ref 98–111)
Creatinine, Ser: 2.85 mg/dL — ABNORMAL HIGH (ref 0.44–1.00)
GFR calc Af Amer: 17 mL/min — ABNORMAL LOW (ref >60)
GFR calc non Af Amer: 15 mL/min — ABNORMAL LOW (ref >60)
Glucose, Bld: 93 mg/dL (ref 70–99)
Potassium: 3.8 mmol/L (ref 3.5–5.1)
Sodium: 140 mmol/L (ref 135–145)
Total Bilirubin: 0.6 mg/dL (ref 0.3–1.2)
Total Protein: 5.5 g/dL — ABNORMAL LOW (ref 6.5–8.1)

## 2019-04-22 LAB — CBC WITH DIFFERENTIAL/PLATELET
Abs Immature Granulocytes: 0.02 10*3/uL (ref 0.00–0.07)
Basophils Absolute: 0.1 10*3/uL (ref 0.0–0.1)
Basophils Relative: 2 %
Eosinophils Absolute: 0.3 10*3/uL (ref 0.0–0.5)
Eosinophils Relative: 5 %
HCT: 23 % — ABNORMAL LOW (ref 36.0–46.0)
Hemoglobin: 7.1 g/dL — ABNORMAL LOW (ref 12.0–15.0)
Immature Granulocytes: 0 %
Lymphocytes Relative: 14 %
Lymphs Abs: 0.8 10*3/uL (ref 0.7–4.0)
MCH: 30.1 pg (ref 26.0–34.0)
MCHC: 30.9 g/dL (ref 30.0–36.0)
MCV: 97.5 fL (ref 80.0–100.0)
Monocytes Absolute: 0.5 10*3/uL (ref 0.1–1.0)
Monocytes Relative: 9 %
Neutro Abs: 3.7 10*3/uL (ref 1.7–7.7)
Neutrophils Relative %: 70 %
Platelets: 272 10*3/uL (ref 150–400)
RBC: 2.36 MIL/uL — ABNORMAL LOW (ref 3.87–5.11)
RDW: 13.9 % (ref 11.5–15.5)
WBC: 5.4 10*3/uL (ref 4.0–10.5)
nRBC: 0 % (ref 0.0–0.2)

## 2019-04-22 LAB — MAGNESIUM: Magnesium: 1.8 mg/dL (ref 1.7–2.4)

## 2019-04-22 LAB — BRAIN NATRIURETIC PEPTIDE: B Natriuretic Peptide: 746.7 pg/mL — ABNORMAL HIGH (ref 0.0–100.0)

## 2019-04-22 LAB — SARS CORONAVIRUS 2 (TAT 6-24 HRS): SARS Coronavirus 2: NEGATIVE

## 2019-04-22 LAB — PHOSPHORUS: Phosphorus: 5.7 mg/dL — ABNORMAL HIGH (ref 2.5–4.6)

## 2019-04-22 MED ORDER — ASPIRIN 325 MG PO TABS
325.0000 mg | ORAL_TABLET | Freq: Every day | ORAL | Status: DC
  Administered 2019-04-23 – 2019-04-25 (×3): 325 mg via ORAL
  Filled 2019-04-22 (×3): qty 1

## 2019-04-22 MED ORDER — LABETALOL HCL 100 MG PO TABS
50.0000 mg | ORAL_TABLET | Freq: Three times a day (TID) | ORAL | Status: DC
  Administered 2019-04-24 – 2019-04-25 (×2): 50 mg via ORAL
  Filled 2019-04-22 (×4): qty 1

## 2019-04-22 MED ORDER — ACETAMINOPHEN 650 MG RE SUPP: 650.0000 mg | Freq: Four times a day (QID) | RECTAL | Status: DC | PRN

## 2019-04-22 MED ORDER — LEVOTHYROXINE SODIUM 88 MCG PO TABS
88.0000 ug | ORAL_TABLET | Freq: Every day | ORAL | Status: DC
  Administered 2019-04-23 – 2019-04-25 (×3): 88 ug via ORAL
  Filled 2019-04-22 (×4): qty 1

## 2019-04-22 MED ORDER — SODIUM BICARBONATE 650 MG PO TABS
650.0000 mg | ORAL_TABLET | Freq: Two times a day (BID) | ORAL | Status: DC
  Administered 2019-04-22 – 2019-04-25 (×6): 650 mg via ORAL
  Filled 2019-04-22 (×6): qty 1

## 2019-04-22 MED ORDER — EZETIMIBE 10 MG PO TABS
10.0000 mg | ORAL_TABLET | Freq: Every day | ORAL | Status: DC
  Administered 2019-04-23 – 2019-04-25 (×3): 10 mg via ORAL
  Filled 2019-04-22 (×3): qty 1

## 2019-04-22 MED ORDER — CLONIDINE HCL 0.1 MG PO TABS
0.1000 mg | ORAL_TABLET | Freq: Once | ORAL | Status: AC
  Administered 2019-04-22: 0.1 mg via ORAL
  Filled 2019-04-22: qty 1

## 2019-04-22 MED ORDER — ATORVASTATIN CALCIUM 40 MG PO TABS
80.0000 mg | ORAL_TABLET | Freq: Every day | ORAL | Status: DC
  Administered 2019-04-23 – 2019-04-24 (×2): 80 mg via ORAL
  Filled 2019-04-22 (×3): qty 2

## 2019-04-22 MED ORDER — ACETAMINOPHEN 325 MG PO TABS
650.0000 mg | ORAL_TABLET | Freq: Four times a day (QID) | ORAL | Status: DC | PRN
  Administered 2019-04-22 – 2019-04-24 (×3): 650 mg via ORAL
  Filled 2019-04-22 (×3): qty 2

## 2019-04-22 MED ORDER — POTASSIUM CHLORIDE CRYS ER 20 MEQ PO TBCR
20.0000 meq | EXTENDED_RELEASE_TABLET | Freq: Once | ORAL | Status: AC
  Administered 2019-04-22: 16:00:00 20 meq via ORAL
  Filled 2019-04-22: qty 1

## 2019-04-22 MED ORDER — COQ10 200 MG PO CAPS: 1.0000 | ORAL_CAPSULE | Freq: Every day | ORAL | Status: DC

## 2019-04-22 MED ORDER — ZINC SULFATE 220 (50 ZN) MG PO CAPS
220.0000 mg | ORAL_CAPSULE | Freq: Every day | ORAL | Status: DC
  Administered 2019-04-23 – 2019-04-25 (×3): 220 mg via ORAL
  Filled 2019-04-22 (×3): qty 1

## 2019-04-22 MED ORDER — FUROSEMIDE 10 MG/ML IJ SOLN
40.0000 mg | Freq: Once | INTRAMUSCULAR | Status: AC
  Administered 2019-04-22: 40 mg via INTRAVENOUS
  Filled 2019-04-22: qty 4

## 2019-04-22 MED ORDER — LISINOPRIL 20 MG PO TABS
40.0000 mg | ORAL_TABLET | Freq: Two times a day (BID) | ORAL | Status: DC
  Administered 2019-04-22 – 2019-04-25 (×5): 40 mg via ORAL
  Filled 2019-04-22 (×7): qty 2

## 2019-04-22 MED ORDER — FERROUS SULFATE 325 (65 FE) MG PO TABS
325.0000 mg | ORAL_TABLET | Freq: Every day | ORAL | Status: DC
  Administered 2019-04-23 – 2019-04-25 (×3): 325 mg via ORAL
  Filled 2019-04-22 (×3): qty 1

## 2019-04-22 NOTE — ED Provider Notes (Signed)
[facility] DEPT Provider Note   CSN: [id number] Arrival date & time: 04/22/19  0915     History Chief Complaint  Patient presents with  . Cough  . Shortness of Breath    Wendy Walters is a 85 y.o. female.  HPI Patient presents to the emergency department with   increasing shortness of breath.  The patient was here last week and left AMA because she had a panic attack.  The patient states that her symptoms continue to progress and that is what brought her back to the emergency department.  Patient states that she has been having increasing swelling in her lower extremities.  Patient states that she realizes that she should not have gone home and should have stayed in the hospital.  The patient denies chest pain,  headache,blurred vision, neck pain, fever, cough, weakness, numbness, dizziness, anorexia, edema, abdominal pain, nausea, vomiting, diarrhea, rash, back pain, dysuria, hematemesis, bloody stool, near syncope, or syncope.  Past Medical History:  Diagnosis Date  . Anemia   . Bradycardia 08/22/2015  . Carotid stenosis    1-39% stenosis  . Chronic kidney disease    stage 3  . Coronary artery disease    s/p PCI 2001 of lad in setting of AWMI-rotational atherectomy with BMS -Dr Radford Pax  . Edema extremities 08/21/2016  . Fibrillary glomerulonephritis    w nephrotic range protenuria, following w neurology, managed with an ACE-I and ARB  . H/O Doppler ultrasound 01/2003   ,39% bilateral carotid stenosis.  . Hyperlipidemia   . Hypertension   . Hypothyroidism   . Osteoporosis    s/p actonel for about 7-8 years    Patient Active Problem List   Diagnosis Date Noted  . Dyspnea 04/22/2019  . Gallstone pancreatitis 02/04/2017  . CKD (chronic kidney disease) stage 3, GFR 30-59 ml/min 02/04/2017  . Anemia of chronic disease 02/04/2017  . Edema extremities 08/21/2016  . Bradycardia 08/22/2015  . Carotid artery disease ([place]) 03/02/2015  . Coronary  atherosclerosis of native coronary artery 07/30/2013  . Pure hypercholesterolemia 07/30/2013  . Essential hypertension, benign 07/30/2013    Past Surgical History:  Procedure Laterality Date  . ANGIOPLASTY  2001  . CARDIAC CATHETERIZATION  2005   patent LAD stent  . CHOLECYSTECTOMY N/A 02/07/2017   Procedure: LAPAROSCOPIC CHOLECYSTECTOMY WITH INTRAOPERATIVE CHOLANGIOGRAM;  Surgeon: Armandina Gemma, MD;  Location: [facility]S;  Service: General;  Laterality: N/A;  . EYE SURGERY Bilateral 2012   Cataract  . TONSILLECTOMY  1942     OB History   No obstetric history on file.     Family History  Problem Relation Age of Onset  . Hypertension Mother   . Cancer - Prostate Father   . Cancer Father        Prostate  . Diabetes Daughter   . Hypertension Daughter     Social History   Tobacco Use  . Smoking status: Former Smoker    Quit date: 06/08/1970    Years since quitting: 48.9  . Smokeless tobacco: Never Used  . Tobacco comment: quit in 1972  Substance Use Topics  . Alcohol use: No    Comment: quit in her 47s  . Drug use: No    Home Medications Prior to Admission medications   Medication Sig Start Date End Date Taking? Authorizing Provider  alendronate (FOSAMAX) 70 MG tablet Take 70 mg by mouth once a week. Take with a full glass of water on an empty stomach.   Yes [provider]  Ascorbic Acid (VITAMIN C) 1000 MG tablet Take 1,000 mg by mouth daily.   Yes [provider]  aspirin 325 MG tablet Take 325 mg by mouth daily.   Yes [provider]  atorvastatin (LIPITOR) 80 MG tablet TAKE 1 TABLET DAILY AT 6 PM. Patient taking differently: Take 80 mg by mouth daily at 6 PM.  01/20/19  Yes Turner, Eber Hong, MD  B Complex Vitamins (VITAMIN B COMPLEX PO) Take 1 tablet by mouth daily.    Yes [provider]  Coenzyme Q10 (COQ10) 200 MG CAPS Take 1 capsule by mouth daily.    Yes [provider]  ezetimibe (ZETIA) 10 MG tablet TAKE 1 TABLET EVERY  DAY 03/31/19  Yes Turner, Eber Hong, MD  ferrous sulfate (GNP IRON) 325 (65 FE) MG tablet Take 325 mg by mouth daily with breakfast.   Yes [provider]  labetalol (NORMODYNE) 100 MG tablet Take 50 mg by mouth 3 (three) times daily.    Yes [provider]  levothyroxine (SYNTHROID, LEVOTHROID) 88 MCG tablet Take 88 mcg by mouth daily before breakfast.   Yes [provider]  lisinopril (PRINIVIL,ZESTRIL) 40 MG tablet Take 1 tablet (40 mg total) by mouth 2 (two) times daily. 02/08/17  Yes Florencia Reasons, MD  Multiple Vitamin (MULTIVITAMIN) tablet Take 1 tablet by mouth daily.   Yes [provider]  niacin 500 MG tablet Take 500 mg by mouth 2 (two) times daily with a meal.   Yes [provider]  Omega-3 Fatty Acids (OMEGA 3 PO) Take 1 capsule by mouth 2 (two) times daily.   Yes [provider]  Probiotic Product (PROBIOTIC PO) Take 1 capsule by mouth every 3 (three) days.   Yes [provider]  sodium bicarbonate 650 MG tablet Take 650 mg by mouth 2 (two) times daily.   Yes [provider]  torsemide (DEMADEX) 20 MG tablet Take 20 mg by mouth 2 (two) times daily.   Yes [provider]  triamcinolone cream (KENALOG) 0.1 % Apply 1 application topically 2 (two) times daily. 04/16/19  Yes [provider]  zinc gluconate 50 MG tablet Take 50 mg by mouth daily.   Yes [provider]  aspirin EC 81 MG tablet Take 1 tablet (81 mg total) by mouth daily. Patient not taking: Reported on 04/22/2019 10/09/17   Sueanne Margarita, MD    Allergies    Codeine and Adhesive [tape]  Review of Systems   Review of Systems All other systems negative except as documented in the HPI. All pertinent positives and negatives as reviewed in the HPI. Physical Exam Updated Vital Signs BP (!) 174/90   Pulse 75   Temp 97.9 F (36.6 C) (Oral)   Resp (!) 23   SpO2 96%   Physical Exam Vitals and nursing note reviewed.  Constitutional:       General: She is not in acute distress.    Appearance: She is well-developed.  HENT:     Head: Normocephalic and atraumatic.  Eyes:     Pupils: Pupils are equal, round, and reactive to light.  Cardiovascular:     Rate and Rhythm: Normal rate and regular rhythm.     Heart sounds: Normal heart sounds. No murmur. No friction rub. No gallop.   Pulmonary:     Effort: Pulmonary effort is normal. No respiratory distress.     Breath sounds: Normal breath sounds. No decreased breath sounds, wheezing or rhonchi.  Abdominal:     General: Bowel sounds are normal. There is no distension.     Palpations: Abdomen is soft.     Tenderness: There is no abdominal tenderness.  Musculoskeletal:     Cervical back: Normal range of motion and neck supple.  Skin:    General: Skin is warm and dry.     Capillary Refill: Capillary refill takes less than 2 seconds.     Findings: No erythema or rash.  Neurological:     Mental Status: She is alert and oriented to person, place, and time.     Motor: No abnormal muscle tone.     Coordination: Coordination normal.  Psychiatric:        Behavior: Behavior normal.     ED Results / Procedures / Treatments   Labs (all labs ordered are listed, but only abnormal results are displayed) Labs Reviewed  COMPREHENSIVE METABOLIC PANEL - Abnormal; Notable for the following components:      Result Value   BUN 45 (*)    Creatinine, Ser 2.85 (*)    Calcium 8.7 (*)    Total Protein 5.5 (*)    Albumin 2.5 (*)    GFR calc non Af Amer 15 (*)    GFR calc Af Amer 17 (*)    All other components within normal limits  CBC WITH DIFFERENTIAL/PLATELET - Abnormal; Notable for the following components:   RBC 2.36 (*)    Hemoglobin 7.1 (*)    HCT 23.0 (*)    All other components within normal limits  URINALYSIS, ROUTINE W REFLEX MICROSCOPIC - Abnormal; Notable for the following components:   Protein, ur 100 (*)    Bacteria, UA RARE (*)    All other components within normal  limits  BRAIN NATRIURETIC PEPTIDE - Abnormal; Notable for the following components:   B Natriuretic Peptide 746.7 (*)    All other components within normal limits  TROPONIN I (HIGH SENSITIVITY) - Abnormal; Notable for the following components:   Troponin I (High Sensitivity) 21 (*)    All other components within normal limits  TROPONIN I (HIGH SENSITIVITY) - Abnormal; Notable for the following components:   Troponin I (High Sensitivity) 22 (*)    All other components within normal limits  MAGNESIUM  PHOSPHORUS    EKG None  Radiology DG Chest 2 View  Result Date: 04/22/2019 CLINICAL DATA:  Continue cough and shortness of breath since last Thursday, history coronary artery disease, hypertension, former smoker EXAM: CHEST - 2 VIEW COMPARISON:  04/16/2019 FINDINGS: Enlargement of cardiac silhouette with pulmonary vascular congestion. Atherosclerotic calcification aorta. Slightly improved interstitial edema. Persistent bibasilar pleural effusions and atelectasis. Underlying emphysematous changes. No segmental consolidation or pneumothorax. Bones demineralized. IMPRESSION: Enlargement of cardiac silhouette with pulmonary vascular congestion. COPD changes with slightly improved interstitial edema. Persistent small bibasilar pleural effusions and atelectasis. Electronically Signed   By: Lavonia Dana M.D.   On: 04/22/2019 12:20    Procedures Procedures (including critical care time)  Medications Ordered in ED Medications  potassium chloride SA (KLOR-CON) CR tablet 20 mEq (has no administration in time range)  acetaminophen (TYLENOL) tablet 650 mg (has no administration in time range)    Or  acetaminophen (TYLENOL) suppository 650 mg (has no administration in time range)  furosemide (LASIX) injection 40 mg (40 mg Intravenous Given 04/22/19 1459)    ED Course  I have reviewed the triage vital signs and the nursing notes.  Pertinent labs & imaging results that were available during my  care  of the patient were reviewed by me and considered in my medical decision making (see chart for details).    MDM Rules/Calculators/A&P                      Patient be admitted for further evaluation care of her congestive heart issue along with her anemia.  Patient has been stable thus far in the emergency department other than her blood pressure being elevated. Final Clinical Impression(s) / ED Diagnoses Final diagnoses:  Acute congestive heart failure, unspecified heart failure type [facility])    Rx / [place] Orders ED Discharge Orders    None       Dalia Heading, PA-C 04/22/19 1539    Sherwood Gambler, MD 04/23/19 [phone]

## 2019-04-22 NOTE — H&P (Signed)
History and Physical    Wendy Walters XNT:[id number] DOB: 1932/10/06 DOA: 04/22/2019  PCP: Leighton Ruff, MD  Patient coming from: Home.  I have personally briefly reviewed patient's old medical records in [place]  Chief Complaint: Shortness of breath.  HPI: Wendy Walters is a 85 y.o. female with medical history significant of chronic anemia, bradycardia, stage III chronic kidney disease, history of fibrillary granular nephritis, coronary artery disease, history of lower extremity edema, hyperlipidemia, hypertension, hypothyroidism, osteoporosis who is returning to the emergency department due to dyspnea after she was seen 6 days ago for similar complaint, but signed AMA and left due to having a panic attack.  She has been having lower extremity edema and abdominal swelling for about a month.  She has had some orthopnea. She denies chest pain, dizziness, diaphoresis or palpitations.  She denies abdominal pain, nausea, vomiting, diarrhea, melena or hematochezia.  No dysuria, frequency or hematuria.  No polyuria, polydipsia, polyphagia or blurred vision.  ED Course: Initial vital signs were temperature 97.9 F, pulse 71, respiration 18, blood pressure 143/81 mmHg and O2 sat 94% on room air.  The patient received supplemental oxygen and 40 mg of furosemide IVP x1.  Urinalysis showed proteinuria of 100 mg/dL and rare bacteria on microscopic examination.  CBC showed a white count is 5.4, hemoglobin 7.1 g/dL and platelets 272.  Troponin was 21 and then 22 ng/L.  Her BNP was 746.7 pg/mL.  EKG was sinus rhythm.  Her electrolytes are normal when calcium is corrected to albumin.  Glucose is 93, BUN 45, creatinine 2.85 mg/dL.  Total protein was 5.5 and albumin 2.5 g/dL.  The rest of the hepatic functions were within normal range.  Review of Systems: As per HPI otherwise 10 point review of systems negative.  Past Medical History:  Diagnosis Date   Anemia    Anemia of chronic disease  02/04/2017   Bradycardia 08/22/2015   Carotid stenosis    1-39% stenosis   Chronic kidney disease    stage 3   Coronary artery disease    s/p PCI 2001 of lad in setting of AWMI-rotational atherectomy with BMS -Dr Radford Pax   Edema extremities 08/21/2016   Fibrillary glomerulonephritis    w nephrotic range protenuria, following w neurology, managed with an ACE-I and ARB   H/O Doppler ultrasound 01/2003   ,39% bilateral carotid stenosis.   Hyperlipidemia    Hypertension    Hypothyroidism    Osteoporosis    s/p actonel for about 7-8 years    Past Surgical History:  Procedure Laterality Date   ANGIOPLASTY  2001   CARDIAC CATHETERIZATION  2005   patent LAD stent   CHOLECYSTECTOMY N/A 02/07/2017   Procedure: LAPAROSCOPIC CHOLECYSTECTOMY WITH INTRAOPERATIVE CHOLANGIOGRAM;  Surgeon: Armandina Gemma, MD;  Location: [facility];  Service: General;  Laterality: N/A;   EYE SURGERY Bilateral 2012   Cataract   TONSILLECTOMY  1942     reports that she quit smoking about 48 years ago. She has never used smokeless tobacco. She reports that she does not drink alcohol or use drugs.  Allergies  Allergen Reactions   Codeine Nausea And Vomiting and Nausea Only   Adhesive [Tape] Other (See Comments)    Pulls skin off     Family History  Problem Relation Age of Onset   Hypertension Mother    Cancer - Prostate Father    Cancer Father        Prostate   Diabetes Daughter    Hypertension Daughter  Prior to Admission medications   Medication Sig Start Date End Date Taking? Authorizing Provider  alendronate (FOSAMAX) 70 MG tablet Take 70 mg by mouth once a week. Take with a full glass of water on an empty stomach.   Yes [provider]  Ascorbic Acid (VITAMIN C) 1000 MG tablet Take 1,000 mg by mouth daily.   Yes [provider]  aspirin 325 MG tablet Take 325 mg by mouth daily.   Yes [provider]  atorvastatin (LIPITOR) 80 MG tablet TAKE 1 TABLET DAILY  AT 6 PM. Patient taking differently: Take 80 mg by mouth daily at 6 PM.  01/20/19  Yes Turner, Eber Hong, MD  B Complex Vitamins (VITAMIN B COMPLEX PO) Take 1 tablet by mouth daily.    Yes [provider]  Coenzyme Q10 (COQ10) 200 MG CAPS Take 1 capsule by mouth daily.    Yes [provider]  ezetimibe (ZETIA) 10 MG tablet TAKE 1 TABLET EVERY DAY 03/31/19  Yes Turner, Eber Hong, MD  ferrous sulfate (GNP IRON) 325 (65 FE) MG tablet Take 325 mg by mouth daily with breakfast.   Yes [provider]  labetalol (NORMODYNE) 100 MG tablet Take 50 mg by mouth 3 (three) times daily.    Yes [provider]  levothyroxine (SYNTHROID, LEVOTHROID) 88 MCG tablet Take 88 mcg by mouth daily before breakfast.   Yes [provider]  lisinopril (PRINIVIL,ZESTRIL) 40 MG tablet Take 1 tablet (40 mg total) by mouth 2 (two) times daily. 02/08/17  Yes Florencia Reasons, MD  Multiple Vitamin (MULTIVITAMIN) tablet Take 1 tablet by mouth daily.   Yes [provider]  niacin 500 MG tablet Take 500 mg by mouth 2 (two) times daily with a meal.   Yes [provider]  Omega-3 Fatty Acids (OMEGA 3 PO) Take 1 capsule by mouth 2 (two) times daily.   Yes [provider]  Probiotic Product (PROBIOTIC PO) Take 1 capsule by mouth every 3 (three) days.   Yes [provider]  sodium bicarbonate 650 MG tablet Take 650 mg by mouth 2 (two) times daily.   Yes [provider]  torsemide (DEMADEX) 20 MG tablet Take 20 mg by mouth 2 (two) times daily.   Yes [provider]  triamcinolone cream (KENALOG) 0.1 % Apply 1 application topically 2 (two) times daily. 04/16/19  Yes [provider]  zinc gluconate 50 MG tablet Take 50 mg by mouth daily.   Yes [provider]  aspirin EC 81 MG tablet Take 1 tablet (81 mg total) by mouth daily. Patient not taking: Reported on 04/22/2019 10/09/17   Sueanne Margarita, MD    Physical Exam: Vitals:   04/22/19  1330 04/22/19 1400 04/22/19 1430 04/22/19 1500  BP: (!) 176/81 (!) 174/82 (!) 175/75 (!) 174/90  Pulse: 63 69 76 75  Resp: 20 (!) 23 19 (!) 23  Temp:      TempSrc:      SpO2: 95% 95% 94% 96%    Constitutional: NAD, calm, comfortable Eyes: PERRL, lids and conjunctivae normal ENMT: Mucous membranes are moist. Posterior pharynx clear of any exudate or lesions. Neck: normal, supple, no masses, no thyromegaly Respiratory: Bibasilar rales.  No wheezing, no rhonchi.. Normal respiratory effort. No accessory muscle use.  Cardiovascular: Regular rate and rhythm, 2/6 systolic murmur, no rubs / gallops.  2+ lower extremities pitting edema. 2+ pedal pulses. No carotid bruits.  Abdomen: Nondistended.  Soft, no tenderness, no masses palpated. No  hepatosplenomegaly. Bowel sounds positive.  Musculoskeletal: no clubbing / cyanosis.  Good ROM, no contractures. Normal muscle tone.  Skin: no rashes, lesions, ulcers on limited dermatological examination. Neurologic: CN 2-12 grossly intact. Sensation intact, DTR normal. Strength 5/5 in all 4.  Psychiatric: Normal judgment and insight. Alert and oriented x 3. Normal mood.   Labs on Admission: I have personally reviewed following labs and imaging studies  CBC: Recent Labs  Lab 04/16/19 2120 04/22/19 1052  WBC 7.3 5.4  NEUTROABS 5.2 3.7  HGB 7.9* 7.1*  HCT 25.4* 23.0*  MCV 96.2 97.5  PLT 337 062   Basic Metabolic Panel: Recent Labs  Lab 04/16/19 2120 04/22/19 1052  NA 141 140  K 4.5 3.8  CL 111 110  CO2 19* 22  GLUCOSE 89 93  BUN 46* 45*  CREATININE 2.53* 2.85*  CALCIUM 8.8* 8.7*   GFR: Estimated Creatinine Clearance: 13.8 mL/min (A) (by C-G formula based on SCr of 2.85 mg/dL (H)). Liver Function Tests: Recent Labs  Lab 04/22/19 1052  AST 22  ALT 19  ALKPHOS 79  BILITOT 0.6  PROT 5.5*  ALBUMIN 2.5*   No results for input(s): LIPASE, AMYLASE in the last 168 hours. No results for input(s): AMMONIA in the last 168  hours. Coagulation Profile: No results for input(s): INR, PROTIME in the last 168 hours. Cardiac Enzymes: No results for input(s): CKTOTAL, CKMB, CKMBINDEX, TROPONINI in the last 168 hours. BNP (last 3 results) No results for input(s): PROBNP in the last 8760 hours. HbA1C: No results for input(s): HGBA1C in the last 72 hours. CBG: No results for input(s): GLUCAP in the last 168 hours. Lipid Profile: No results for input(s): CHOL, HDL, LDLCALC, TRIG, CHOLHDL, LDLDIRECT in the last 72 hours. Thyroid Function Tests: No results for input(s): TSH, T4TOTAL, FREET4, T3FREE, THYROIDAB in the last 72 hours. Anemia Panel: No results for input(s): VITAMINB12, FOLATE, FERRITIN, TIBC, IRON, RETICCTPCT in the last 72 hours. Urine analysis:    Component Value Date/Time   COLORURINE YELLOW 04/22/2019 [place] 04/22/2019 1314   LABSPEC 1.010 04/22/2019 1314   PHURINE 5.0 04/22/2019 1314   GLUCOSEU NEGATIVE 04/22/2019 1314   HGBUR NEGATIVE 04/22/2019 1314   BILIRUBINUR NEGATIVE 04/22/2019 1314   [place] 04/22/2019 1314   PROTEINUR 100 (A) 04/22/2019 1314   NITRITE NEGATIVE 04/22/2019 1314   LEUKOCYTESUR NEGATIVE 04/22/2019 1314    Radiological Exams on Admission: DG Chest 2 View  Result Date: 04/22/2019 CLINICAL DATA:  Continue cough and shortness of breath since last Thursday, history coronary artery disease, hypertension, former smoker EXAM: CHEST - 2 VIEW COMPARISON:  04/16/2019 FINDINGS: Enlargement of cardiac silhouette with pulmonary vascular congestion. Atherosclerotic calcification aorta. Slightly improved interstitial edema. Persistent bibasilar pleural effusions and atelectasis. Underlying emphysematous changes. No segmental consolidation or pneumothorax. Bones demineralized. IMPRESSION: Enlargement of cardiac silhouette with pulmonary vascular congestion. COPD changes with slightly improved interstitial edema. Persistent small bibasilar pleural effusions and  atelectasis. Electronically Signed   By: Lavonia Dana M.D.   On: 04/22/2019 12:20    EKG: Independently reviewed.  Vent. rate 64 BPM PR interval * ms QRS duration 97 ms QT/QTc 455/470 ms P-R-T axes 67 -10 60 Sinus rhythm  Assessment/Plan Principal Problem:   Dyspnea Observation/telemetry. Continue supplemental oxygen. Continue furosemide 40 mg IVP twice daily. Monitor daily weights, intake and output. Monitor renal function electrolytes. Check echocardiogram.  Active Problems:   Coronary atherosclerosis of native coronary artery Continue statin, aspirin and labetalol.    Pure hypercholesterolemia  Continue atorvastatin and ezetimibe    Essential hypertension, benign   CKD (chronic kidney disease) stage 3, GFR 30-59 ml/min Monitor renal function electrolytes. Continue loop diuretic. Continue sodium bicarbonate.    Hypothyroidism Continue levothyroxine 88 mcg p.o. daily.    DVT prophylaxis: SCDs. Code Status: Full code. Family Communication:  Disposition Plan: Observation for dyspnea/volume overload work-up and treatment. Consults called: Admission status: Observation/telemetry.   Reubin Milan MD [facility]  If 7PM-7AM, please contact night-coverage [email or web address]  04/22/2019, 3:39 PM   This document was created using Dragon voice recognition software and may contain some unintended transcription errors.

## 2019-04-22 NOTE — ED Triage Notes (Signed)
Pt daughter reports patient having SOB and cough, they were here couple days ago for same and patient had a panic attack and had to take patient out of here AMA. Pt was given two Valium today before brought in.  Pt has dry cough in triage and denies pains.

## 2019-04-22 NOTE — ED Notes (Signed)
Pure wick has been placed. Suction set to 45mmHg.  

## 2019-04-23 ENCOUNTER — Observation Stay (HOSPITAL_COMMUNITY): Payer: Medicare Other

## 2019-04-23 ENCOUNTER — Encounter (HOSPITAL_COMMUNITY): Payer: Self-pay | Admitting: Internal Medicine

## 2019-04-23 DIAGNOSIS — M810 Age-related osteoporosis without current pathological fracture: Secondary | ICD-10-CM | POA: Diagnosis present

## 2019-04-23 DIAGNOSIS — Z91048 Other nonmedicinal substance allergy status: Secondary | ICD-10-CM | POA: Diagnosis not present

## 2019-04-23 DIAGNOSIS — Z7982 Long term (current) use of aspirin: Secondary | ICD-10-CM | POA: Diagnosis not present

## 2019-04-23 DIAGNOSIS — Z20828 Contact with and (suspected) exposure to other viral communicable diseases: Secondary | ICD-10-CM | POA: Diagnosis present

## 2019-04-23 DIAGNOSIS — I10 Essential (primary) hypertension: Secondary | ICD-10-CM | POA: Diagnosis not present

## 2019-04-23 DIAGNOSIS — I2510 Atherosclerotic heart disease of native coronary artery without angina pectoris: Secondary | ICD-10-CM | POA: Diagnosis present

## 2019-04-23 DIAGNOSIS — R0600 Dyspnea, unspecified: Secondary | ICD-10-CM | POA: Diagnosis not present

## 2019-04-23 DIAGNOSIS — Z79899 Other long term (current) drug therapy: Secondary | ICD-10-CM | POA: Diagnosis not present

## 2019-04-23 DIAGNOSIS — I340 Nonrheumatic mitral (valve) insufficiency: Secondary | ICD-10-CM | POA: Diagnosis not present

## 2019-04-23 DIAGNOSIS — I130 Hypertensive heart and chronic kidney disease with heart failure and stage 1 through stage 4 chronic kidney disease, or unspecified chronic kidney disease: Secondary | ICD-10-CM | POA: Diagnosis present

## 2019-04-23 DIAGNOSIS — E7800 Pure hypercholesterolemia, unspecified: Secondary | ICD-10-CM | POA: Diagnosis present

## 2019-04-23 DIAGNOSIS — E039 Hypothyroidism, unspecified: Secondary | ICD-10-CM | POA: Diagnosis present

## 2019-04-23 DIAGNOSIS — I509 Heart failure, unspecified: Secondary | ICD-10-CM | POA: Diagnosis not present

## 2019-04-23 DIAGNOSIS — Z885 Allergy status to narcotic agent status: Secondary | ICD-10-CM | POA: Diagnosis not present

## 2019-04-23 DIAGNOSIS — Z87891 Personal history of nicotine dependence: Secondary | ICD-10-CM | POA: Diagnosis not present

## 2019-04-23 DIAGNOSIS — E785 Hyperlipidemia, unspecified: Secondary | ICD-10-CM | POA: Diagnosis present

## 2019-04-23 DIAGNOSIS — Z7983 Long term (current) use of bisphosphonates: Secondary | ICD-10-CM | POA: Diagnosis not present

## 2019-04-23 DIAGNOSIS — Z955 Presence of coronary angioplasty implant and graft: Secondary | ICD-10-CM | POA: Diagnosis not present

## 2019-04-23 DIAGNOSIS — R809 Proteinuria, unspecified: Secondary | ICD-10-CM | POA: Diagnosis present

## 2019-04-23 DIAGNOSIS — Z8249 Family history of ischemic heart disease and other diseases of the circulatory system: Secondary | ICD-10-CM | POA: Diagnosis not present

## 2019-04-23 DIAGNOSIS — I5031 Acute diastolic (congestive) heart failure: Secondary | ICD-10-CM | POA: Diagnosis present

## 2019-04-23 DIAGNOSIS — N184 Chronic kidney disease, stage 4 (severe): Secondary | ICD-10-CM | POA: Diagnosis present

## 2019-04-23 LAB — CBC
HCT: 21.5 % — ABNORMAL LOW (ref 36.0–46.0)
Hemoglobin: 6.5 g/dL — CL (ref 12.0–15.0)
MCH: 29.7 pg (ref 26.0–34.0)
MCHC: 30.2 g/dL (ref 30.0–36.0)
MCV: 98.2 fL (ref 80.0–100.0)
Platelets: 266 10*3/uL (ref 150–400)
RBC: 2.19 MIL/uL — ABNORMAL LOW (ref 3.87–5.11)
RDW: 14 % (ref 11.5–15.5)
WBC: 4.8 10*3/uL (ref 4.0–10.5)
nRBC: 0 % (ref 0.0–0.2)

## 2019-04-23 LAB — ECHOCARDIOGRAM COMPLETE
Height: 64 in
Weight: 2338.64 oz

## 2019-04-23 LAB — BASIC METABOLIC PANEL
Anion gap: 12 (ref 5–15)
BUN: 44 mg/dL — ABNORMAL HIGH (ref 8–23)
CO2: 20 mmol/L — ABNORMAL LOW (ref 22–32)
Calcium: 8.4 mg/dL — ABNORMAL LOW (ref 8.9–10.3)
Chloride: 109 mmol/L (ref 98–111)
Creatinine, Ser: 2.81 mg/dL — ABNORMAL HIGH (ref 0.44–1.00)
GFR calc Af Amer: 17 mL/min — ABNORMAL LOW (ref >60)
GFR calc non Af Amer: 15 mL/min — ABNORMAL LOW (ref >60)
Glucose, Bld: 73 mg/dL (ref 70–99)
Potassium: 3.9 mmol/L (ref 3.5–5.1)
Sodium: 141 mmol/L (ref 135–145)

## 2019-04-23 MED ORDER — FUROSEMIDE 10 MG/ML IJ SOLN
40.0000 mg | Freq: Two times a day (BID) | INTRAMUSCULAR | Status: DC
  Administered 2019-04-23 – 2019-04-24 (×2): 40 mg via INTRAVENOUS
  Filled 2019-04-23 (×2): qty 4

## 2019-04-23 MED ORDER — FUROSEMIDE 10 MG/ML IJ SOLN: 40.0000 mg | Freq: Two times a day (BID) | INTRAMUSCULAR | Status: DC

## 2019-04-23 NOTE — Progress Notes (Signed)
CRITICAL VALUE STICKER  CRITICAL VALUE: Hemoglobin 6.5   DATE & TIME NOTIFIED: 04/23/19 06:10  NP NOTIFIED: [place]  TIME OF NOTIFICATION: 0630  RESPONSE: awaiting response

## 2019-04-23 NOTE — Progress Notes (Signed)
  Echocardiogram 2D Echocardiogram has been performed.  Burnett Kanaris 04/23/2019, 9:51 AM

## 2019-04-23 NOTE — Progress Notes (Signed)
PROGRESS NOTE    Wendy Walters  YQI:[id number] DOB: [birth year] DOA: 04/22/2019 PCP: Leighton Ruff, MD    Brief Narrative:   85 y.o. female with medical history significant of chronic anemia, bradycardia, stage III chronic kidney disease, history of fibrillary granular nephritis, coronary artery disease, history of lower extremity edema, hyperlipidemia, hypertension, hypothyroidism, osteoporosis who is returning to the emergency department due to dyspnea after she was seen 6 days ago for similar complaint, but signed AMA and left due to having a panic attack.  She has been having lower extremity edema and abdominal swelling for about a month.  She has had some orthopnea. She denies chest pain, dizziness, diaphoresis or palpitations.  She denies abdominal pain, nausea, vomiting, diarrhea, melena or hematochezia.  No dysuria, frequency or hematuria.  No polyuria, polydipsia, polyphagia or blurred vision.  Assessment & Plan:   Principal Problem:   Dyspnea Active Problems:   Coronary atherosclerosis of native coronary artery   Pure hypercholesterolemia   Essential hypertension, benign   CKD (chronic kidney disease) stage 3, GFR 30-59 ml/min   Hypothyroidism   CHF (congestive heart failure) (HCC)  Principal Problem:   Dyspnea -Continue supplemental oxygen and wean as tolerated -Will order furosemide 40 mg IV twice daily. -Pt noted improvement following one dose of lasix overnight -Continue to monitor daily weights, intake and output. -Repeat bmet in AM  Active Problems:   Coronary atherosclerosis of native coronary artery -Continue statin, aspirin and labetalol as tolerated -2d echo reviewed personally. Normal LVEF noted    Pure hypercholesterolemia -Continue atorvastatin and ezetimibe -Stable at present    Essential hypertension, benign -CKD (chronic kidney disease) stage 3, GFR 30-59 ml/min -Continue lasix per above    Hypothyroidism -Continue levothyroxine 88 mcg  p.o. daily as tolerated  CKD stage 4 -Pt follows Nephrology as outpatient -Continue on lasix per above -Follow I/O  DVT prophylaxis: SCD's Code Status: FUll Family Communication: Pt in room, family not at bedside Disposition Plan: Uncertain at this time  Consultants:     Procedures:     Antimicrobials: Anti-infectives (From admission, onward)   None       Subjective: Reports feeling better after receiving lasix last night  Objective: Vitals:   04/22/19 2337 04/22/19 2353 04/23/19 0500 04/23/19 1251  BP:  (!) 156/77 (!) 145/75 (!) 157/76  Pulse:  67 70 71  Resp:  20 18   Temp:  98.2 F (36.8 C) 98.4 F (36.9 C) 98.3 F (36.8 C)  TempSrc:  Oral Oral Oral  SpO2:  97% 99% 97%  Weight: 66.3 kg  66.3 kg   Height: 5\' 4"  (1.626 m)       Intake/Output Summary (Last 24 hours) at 04/23/2019 1523 Last data filed at 04/23/2019 0852 Gross per 24 hour  Intake 240 ml  Output --  Net 240 ml   Filed Weights   04/22/19 2337 04/23/19 0500  Weight: 66.3 kg 66.3 kg    Examination: General exam: Appears calm and comfortable  Respiratory system: Clear to auscultation. Respiratory effort normal. Cardiovascular system: S1 & S2 heard, Regular Gastrointestinal system: Abdomen is nondistended, soft and nontender. No organomegaly or masses felt. Normal bowel sounds heard. Central nervous system: Alert and oriented. No focal neurological deficits. Extremities: Symmetric 5 x 5 power. BLE edema Skin: No rashes, lesions Psychiatry: Judgement and insight appear normal. Mood & affect appropriate.   Data Reviewed: I have personally reviewed following labs and imaging studies  CBC: Recent Labs  Lab 04/16/19 2120 04/22/19 1052  04/23/19 0512  WBC 7.3 5.4 4.8  NEUTROABS 5.2 3.7  --   HGB 7.9* 7.1* 6.5*  HCT 25.4* 23.0* 21.5*  MCV 96.2 97.5 98.2  PLT 337 272 379   Basic Metabolic Panel: Recent Labs  Lab 04/16/19 2120 04/22/19 1052 04/22/19 1314 04/23/19 0512  NA 141  140  --  141  K 4.5 3.8  --  3.9  CL 111 110  --  109  CO2 19* 22  --  20*  GLUCOSE 89 93  --  73  BUN 46* 45*  --  44*  CREATININE 2.53* 2.85*  --  2.81*  CALCIUM 8.8* 8.7*  --  8.4*  MG  --   --  1.8  --   PHOS  --   --  5.7*  --    GFR: Estimated Creatinine Clearance: 14 mL/min (A) (by C-G formula based on SCr of 2.81 mg/dL (H)). Liver Function Tests: Recent Labs  Lab 04/22/19 1052  AST 22  ALT 19  ALKPHOS 79  BILITOT 0.6  PROT 5.5*  ALBUMIN 2.5*   No results for input(s): LIPASE, AMYLASE in the last 168 hours. No results for input(s): AMMONIA in the last 168 hours. Coagulation Profile: No results for input(s): INR, PROTIME in the last 168 hours. Cardiac Enzymes: No results for input(s): CKTOTAL, CKMB, CKMBINDEX, TROPONINI in the last 168 hours. BNP (last 3 results) No results for input(s): PROBNP in the last 8760 hours. HbA1C: No results for input(s): HGBA1C in the last 72 hours. CBG: No results for input(s): GLUCAP in the last 168 hours. Lipid Profile: No results for input(s): CHOL, HDL, LDLCALC, TRIG, CHOLHDL, LDLDIRECT in the last 72 hours. Thyroid Function Tests: No results for input(s): TSH, T4TOTAL, FREET4, T3FREE, THYROIDAB in the last 72 hours. Anemia Panel: No results for input(s): VITAMINB12, FOLATE, FERRITIN, TIBC, IRON, RETICCTPCT in the last 72 hours. Sepsis Labs: No results for input(s): PROCALCITON, LATICACIDVEN in the last 168 hours.  Recent Results (from the past 240 hour(s))  SARS CORONAVIRUS 2 (TAT 6-24 HRS) Nasopharyngeal Nasopharyngeal Swab     Status: None   Collection Time: 04/22/19  3:46 PM   Specimen: Nasopharyngeal Swab  Result Value Ref Range Status   SARS Coronavirus 2 NEGATIVE NEGATIVE Final    Comment: (NOTE) SARS-CoV-2 target nucleic acids are NOT DETECTED. The SARS-CoV-2 RNA is generally detectable in upper and lower respiratory specimens during the acute phase of infection. Negative results do not preclude SARS-CoV-2  infection, do not rule out co-infections with other pathogens, and should not be used as the sole basis for treatment or other patient management decisions. Negative results must be combined with clinical observations, patient history, and epidemiological information. The expected result is Negative. Fact Sheet for Patients: SugarRoll.be Fact Sheet for Healthcare Providers: https://www.woods-mathews.com/ This test is not yet approved or cleared by the [place] FDA and  has been authorized for detection and/or diagnosis of SARS-CoV-2 by FDA under an Emergency Use Authorization (EUA). This EUA will remain  in effect (meaning this test can be used) for the duration of the COVID-19 declaration under Section 56 4(b)(1) of the Act, 21 U.S.C. section 360bbb-3(b)(1), unless the authorization is terminated or revoked sooner. Performed at [facility], [place] [address], [place], [place] [address]      Radiology Studies: DG Chest 2 View  Result Date: 04/22/2019 CLINICAL DATA:  Continue cough and shortness of breath since last Thursday, history coronary artery disease, hypertension, former smoker EXAM: CHEST -  2 VIEW COMPARISON:  04/16/2019 FINDINGS: Enlargement of cardiac silhouette with pulmonary vascular congestion. Atherosclerotic calcification aorta. Slightly improved interstitial edema. Persistent bibasilar pleural effusions and atelectasis. Underlying emphysematous changes. No segmental consolidation or pneumothorax. Bones demineralized. IMPRESSION: Enlargement of cardiac silhouette with pulmonary vascular congestion. COPD changes with slightly improved interstitial edema. Persistent small bibasilar pleural effusions and atelectasis. Electronically Signed   By: Lavonia Dana M.D.   On: 04/22/2019 12:20   ECHOCARDIOGRAM COMPLETE  Result Date: 04/23/2019   ECHOCARDIOGRAM REPORT   Patient Name:   Wendy Walters Date of Exam: 04/23/2019 Medical Rec  #:  [id number]      Height:       64.0 in Accession #:    [id number]     Weight:       146.2 lb Date of Birth:  [birth year]     BSA:          1.71 m Patient Age:    7 years       BP:           145/75 mmHg Patient Gender: F              HR:           76 bpm. Exam Location:  Inpatient Procedure: 2D Echo, Cardiac Doppler and Color Doppler Indications:    Dyspnea 786.09  History:        Patient has no prior history of Echocardiogram examinations.                 Signs/Symptoms:Dyspnea; Risk Factors:Hypertension and Former                 Smoker. Carotid artery disease.  Sonographer:    Paulita Fujita RDCS Referring Phys: [id number] DAVID MANUEL [place]  1. Left ventricular ejection fraction, by visual estimation, is 50 to 55%. The left ventricle has hyperdynamic function. There is no left ventricular hypertrophy.  2. Left ventricular diastolic parameters are consistent with Grade I diastolic dysfunction (impaired relaxation).  3. The left ventricle has no regional wall motion abnormalities.  4. Global right ventricle has normal systolic function.The right ventricular size is normal. No increase in right ventricular wall thickness.  5. Left atrial size was mildly dilated.  6. Right atrial size was normal.  7. Large pleural effusion in the left lateral region.  8. Small pericardial effusion.  9. The pericardial effusion is circumferential. 10. Moderate mitral annular calcification. 11. The mitral valve is abnormal. Mild mitral valve regurgitation. 12. The tricuspid valve is grossly normal. Tricuspid valve regurgitation is trivial. 13. Aortic valve area, by VTI measures 1.47 cm. 14. Aortic valve mean gradient measures 11.0 mmHg. 15. Aortic valve peak gradient measures 19.7 mmHg. 16. The aortic valve is tricuspid. Aortic valve regurgitation is not visualized. Mild to moderate aortic valve stenosis. 17. The pulmonic valve was grossly normal. Pulmonic valve regurgitation is not visualized. 18. Moderately elevated  pulmonary artery systolic pressure. 19. The inferior vena cava is normal in size with <50% respiratory variability, suggesting right atrial pressure of 8 mmHg. 20. Evidence of atrial level shunting detected by color flow Doppler. 21. Small patent foramen ovale with predominantly right to left shunting across the atrial septum. FINDINGS  Left Ventricle: Left ventricular ejection fraction, by visual estimation, is 50 to 55%. The left ventricle has hyperdynamic function. The left ventricle has no regional wall motion abnormalities. There is no left ventricular hypertrophy. Left ventricular diastolic parameters are consistent with Grade I diastolic dysfunction (impaired relaxation). Indeterminate  filling pressures. Right Ventricle: The right ventricular size is normal. No increase in right ventricular wall thickness. Global RV systolic function is has normal systolic function. The tricuspid regurgitant velocity is 3.18 m/s, and with an assumed right atrial pressure  of 8 mmHg, the estimated right ventricular systolic pressure is moderately elevated at 48.4 mmHg. Left Atrium: Left atrial size was mildly dilated. Right Atrium: Right atrial size was normal in size Pericardium: A small pericardial effusion is present. The pericardial effusion is circumferential. There is a large pleural effusion in the left lateral region. Mitral Valve: The mitral valve is abnormal. There is mild thickening of the mitral valve leaflet(s). Moderate mitral annular calcification. Mild mitral valve regurgitation. Tricuspid Valve: The tricuspid valve is grossly normal. Tricuspid valve regurgitation is trivial. Aortic Valve: The aortic valve is tricuspid. Aortic valve regurgitation is not visualized. Mild to moderate aortic stenosis is present. Aortic valve mean gradient measures 11.0 mmHg. Aortic valve peak gradient measures 19.7 mmHg. Aortic valve area, by VTI measures 1.47 cm. Pulmonic Valve: The pulmonic valve was grossly normal. Pulmonic  valve regurgitation is not visualized. Pulmonic regurgitation is not visualized. Aorta: The aortic root and ascending aorta are structurally normal, with no evidence of dilitation. Venous: The inferior vena cava is normal in size with less than 50% respiratory variability, suggesting right atrial pressure of 8 mmHg. IAS/Shunts: Evidence of atrial level shunting detected by color flow Doppler. A small patent foramen ovale is detected with predominantly right to left shunting across the atrial septum.  LEFT VENTRICLE PLAX 2D LVIDd:         4.40 cm       Diastology LVIDs:         3.40 cm       LV e' lateral:   7.07 cm/s LV PW:         0.90 cm       LV E/e' lateral: 12.8 LV IVS:        0.90 cm       LV e' medial:    5.87 cm/s LVOT diam:     1.70 cm       LV E/e' medial:  15.4 LV SV:         40 ml LV SV Index:   23.11 LVOT Area:     2.27 cm  LV Volumes (MOD) LV area d, A2C:    37.80 cm LV area d, A4C:    35.30 cm LV area s, A2C:    23.20 cm LV area s, A4C:    22.40 cm LV major d, A2C:   8.02 cm LV major d, A4C:   7.51 cm LV major s, A2C:   6.51 cm LV major s, A4C:   6.44 cm LV vol d, MOD A2C: 150.0 ml LV vol d, MOD A4C: 133.0 ml LV vol s, MOD A2C: 69.0 ml LV vol s, MOD A4C: 62.5 ml LV SV MOD A2C:     81.0 ml LV SV MOD A4C:     133.0 ml LV SV MOD BP:      79.3 ml RIGHT VENTRICLE RV S prime:     11.70 cm/s TAPSE (M-mode): 2.9 cm LEFT ATRIUM             Index       RIGHT ATRIUM           Index LA diam:        2.50 cm 1.46 cm/m  RA Area:     17.20  cm LA Vol (A2C):   65.7 ml 38.37 ml/m RA Volume:   47.40 ml  27.68 ml/m LA Vol (A4C):   51.5 ml 30.08 ml/m LA Biplane Vol: 59.1 ml 34.52 ml/m  AORTIC VALVE AV Area (Vmax):    1.55 cm AV Area (Vmean):   1.44 cm AV Area (VTI):     1.47 cm AV Vmax:           222.00 cm/s AV Vmean:          159.000 cm/s AV VTI:            0.519 m AV Peak Grad:      19.7 mmHg AV Mean Grad:      11.0 mmHg LVOT Vmax:         152.00 cm/s LVOT Vmean:        101.000 cm/s LVOT VTI:          0.336  m LVOT/AV VTI ratio: 0.65  AORTA Ao Root diam: 2.80 cm MITRAL VALVE                         TRICUSPID VALVE MV Area (PHT): 5.27 cm              TR Peak grad:   40.4 mmHg MV PHT:        41.76 msec            TR Vmax:        318.00 cm/s MV Decel Time: 144 msec MR Peak grad: 98.0 mmHg              SHUNTS MR Vmax:      495.00 cm/s            Systemic VTI:  0.34 m MV E velocity: 90.40 cm/s  103 cm/s  Systemic Diam: 1.70 cm MV A velocity: 105.00 cm/s 70.3 cm/s MV E/A ratio:  0.86        1.5  Lyman Bishop MD Electronically signed by Lyman Bishop MD Signature Date/Time: 04/23/2019/10:45:06 AM    Final     Scheduled Meds: . aspirin  325 mg Oral Daily  . atorvastatin  80 mg Oral q1800  . ezetimibe  10 mg Oral Daily  . ferrous sulfate  325 mg Oral Q breakfast  . furosemide  40 mg Intravenous BID  . labetalol  50 mg Oral TID  . levothyroxine  88 mcg Oral Q0600  . lisinopril  40 mg Oral BID  . sodium bicarbonate  650 mg Oral BID  . zinc sulfate  220 mg Oral Daily   Continuous Infusions:   LOS: 0 days   Marylu Lund, MD [facility] Pager On Amion  If 7PM-7AM, please contact night-coverage 04/23/2019, 3:23 PM

## 2019-04-24 DIAGNOSIS — R0600 Dyspnea, unspecified: Secondary | ICD-10-CM

## 2019-04-24 LAB — CBC
HCT: 21.3 % — ABNORMAL LOW (ref 36.0–46.0)
Hemoglobin: 6.5 g/dL — CL (ref 12.0–15.0)
MCH: 30.1 pg (ref 26.0–34.0)
MCHC: 30.5 g/dL (ref 30.0–36.0)
MCV: 98.6 fL (ref 80.0–100.0)
Platelets: 273 10*3/uL (ref 150–400)
RBC: 2.16 MIL/uL — ABNORMAL LOW (ref 3.87–5.11)
RDW: 13.9 % (ref 11.5–15.5)
WBC: 4.8 10*3/uL (ref 4.0–10.5)
nRBC: 0 % (ref 0.0–0.2)

## 2019-04-24 LAB — BASIC METABOLIC PANEL
Anion gap: 10 (ref 5–15)
BUN: 45 mg/dL — ABNORMAL HIGH (ref 8–23)
CO2: 21 mmol/L — ABNORMAL LOW (ref 22–32)
Calcium: 8.3 mg/dL — ABNORMAL LOW (ref 8.9–10.3)
Chloride: 109 mmol/L (ref 98–111)
Creatinine, Ser: 2.92 mg/dL — ABNORMAL HIGH (ref 0.44–1.00)
GFR calc Af Amer: 16 mL/min — ABNORMAL LOW (ref >60)
GFR calc non Af Amer: 14 mL/min — ABNORMAL LOW (ref >60)
Glucose, Bld: 85 mg/dL (ref 70–99)
Potassium: 3.7 mmol/L (ref 3.5–5.1)
Sodium: 140 mmol/L (ref 135–145)

## 2019-04-24 LAB — PREPARE RBC (CROSSMATCH)

## 2019-04-24 MED ORDER — SODIUM CHLORIDE 0.9% IV SOLUTION: Freq: Once | INTRAVENOUS | Status: AC

## 2019-04-24 MED ORDER — ALUM & MAG HYDROXIDE-SIMETH 200-200-20 MG/5ML PO SUSP
30.0000 mL | ORAL | Status: DC | PRN
  Administered 2019-04-24: 30 mL via ORAL
  Filled 2019-04-24: qty 30

## 2019-04-24 MED ORDER — ONDANSETRON HCL 4 MG/2ML IJ SOLN: 4.0000 mg | Freq: Three times a day (TID) | INTRAMUSCULAR | Status: DC | PRN

## 2019-04-24 MED ORDER — HYOSCYAMINE SULFATE 0.125 MG/5ML PO ELIX
0.2500 mg | ORAL_SOLUTION | Freq: Four times a day (QID) | ORAL | Status: DC | PRN
  Filled 2019-04-24 (×2): qty 10

## 2019-04-24 MED ORDER — HYOSCYAMINE SULFATE 0.125 MG/ML PO SOLN
0.2500 mg | Freq: Four times a day (QID) | ORAL | Status: DC | PRN
  Filled 2019-04-24: qty 2

## 2019-04-24 NOTE — Progress Notes (Signed)
PROGRESS NOTE    Wendy Walters  VFI:[id number] DOB: April 21, 1933 DOA: 04/22/2019 PCP: Leighton Ruff, MD    Brief Narrative:   85 y.o. female with medical history significant of chronic anemia, bradycardia, stage III chronic kidney disease, history of fibrillary granular nephritis, coronary artery disease, history of lower extremity edema, hyperlipidemia, hypertension, hypothyroidism, osteoporosis who is returning to the emergency department due to dyspnea after she was seen 6 days ago for similar complaint, but signed AMA and left due to having a panic attack.  She has been having lower extremity edema and abdominal swelling for about a month.  She has had some orthopnea. She denies chest pain, dizziness, diaphoresis or palpitations.  She denies abdominal pain, nausea, vomiting, diarrhea, melena or hematochezia.  No dysuria, frequency or hematuria.  No polyuria, polydipsia, polyphagia or blurred vision.  Assessment & Plan:   Principal Problem:   Dyspnea Active Problems:   Coronary atherosclerosis of native coronary artery   Pure hypercholesterolemia   Essential hypertension, benign   CKD (chronic kidney disease) stage 3, GFR 30-59 ml/min   Hypothyroidism   CHF (congestive heart failure) (HCC)  Principal Problem:   Dyspnea -Continue supplemental oxygen and wean as tolerated -Pt noted improvement following one dose of lasix overnight -Continue to monitor daily weights, intake and output. -Edema improved. Dyspnea resolved. Cr has risen to 2.9. Will hold further lasix -Recheck bmet in AM  Active Problems:   Coronary atherosclerosis of native coronary artery -Continue statin, aspirin and labetalol as tolerated -2d echo reviewed personally. Normal LVEF noted    Pure hypercholesterolemia -Continue atorvastatin and ezetimibe -Stable currently    Essential hypertension, benign -CKD (chronic kidney disease) stage 3, GFR 30-59 ml/min -Lasix on hold -Will hold lisinopril given  Cr slightly increased to 2.9    Hypothyroidism -Continue levothyroxine 88 mcg p.o. daily as tolerated  CKD stage 4 -Pt follows Nephrology as outpatient -Hold lasix given Cr increase to 2.9 -Hold lisinopril  DVT prophylaxis: SCD's Code Status: FUll Family Communication: Pt in room, family not at bedside Disposition Plan: Possible d/c in 24-48hrs if renal function stable  Consultants:     Procedures:     Antimicrobials: Anti-infectives (From admission, onward)   None      Subjective: States breathing much better today  Objective: Vitals:   04/23/19 2048 04/24/19 0500 04/24/19 1303 04/24/19 1304  BP: (!) 157/81 (!) 150/80  (!) 176/83  Pulse: 79 82  86  Resp: 18 18  18   Temp: 98.4 F (36.9 C) 98.1 F (36.7 C) (!) 97.5 F (36.4 C)   TempSrc: Oral Oral Oral   SpO2: 97% 98%  95%  Weight:      Height:        Intake/Output Summary (Last 24 hours) at 04/24/2019 1621 Last data filed at 04/24/2019 1300 Gross per 24 hour  Intake 480 ml  Output --  Net 480 ml   Filed Weights   04/22/19 2337 04/23/19 0500  Weight: 66.3 kg 66.3 kg    Examination: General exam: Conversant, in no acute distress Respiratory system: normal chest rise, clear, no audible wheezing Cardiovascular system: regular rhythm, s1-s2 Gastrointestinal system: Nondistended, nontender, pos BS Central nervous system: No seizures, no tremors Extremities: No cyanosis, no joint deformities Skin: No rashes, no pallor Psychiatry: Affect normal // no auditory hallucinations   Data Reviewed: I have personally reviewed following labs and imaging studies  CBC: Recent Labs  Lab 04/22/19 1052 04/23/19 0512 04/24/19 0512  WBC 5.4 4.8 4.8  NEUTROABS  3.7  --   --   HGB 7.1* 6.5* 6.5*  HCT 23.0* 21.5* 21.3*  MCV 97.5 98.2 98.6  PLT 272 266 151   Basic Metabolic Panel: Recent Labs  Lab 04/22/19 1052 04/22/19 1314 04/23/19 0512 04/24/19 0512  NA 140  --  141 140  K 3.8  --  3.9 3.7  CL 110   --  109 109  CO2 22  --  20* 21*  GLUCOSE 93  --  73 85  BUN 45*  --  44* 45*  CREATININE 2.85*  --  2.81* 2.92*  CALCIUM 8.7*  --  8.4* 8.3*  MG  --  1.8  --   --   PHOS  --  5.7*  --   --    GFR: Estimated Creatinine Clearance: 13.4 mL/min (A) (by C-G formula based on SCr of 2.92 mg/dL (H)). Liver Function Tests: Recent Labs  Lab 04/22/19 1052  AST 22  ALT 19  ALKPHOS 79  BILITOT 0.6  PROT 5.5*  ALBUMIN 2.5*   No results for input(s): LIPASE, AMYLASE in the last 168 hours. No results for input(s): AMMONIA in the last 168 hours. Coagulation Profile: No results for input(s): INR, PROTIME in the last 168 hours. Cardiac Enzymes: No results for input(s): CKTOTAL, CKMB, CKMBINDEX, TROPONINI in the last 168 hours. BNP (last 3 results) No results for input(s): PROBNP in the last 8760 hours. HbA1C: No results for input(s): HGBA1C in the last 72 hours. CBG: No results for input(s): GLUCAP in the last 168 hours. Lipid Profile: No results for input(s): CHOL, HDL, LDLCALC, TRIG, CHOLHDL, LDLDIRECT in the last 72 hours. Thyroid Function Tests: No results for input(s): TSH, T4TOTAL, FREET4, T3FREE, THYROIDAB in the last 72 hours. Anemia Panel: No results for input(s): VITAMINB12, FOLATE, FERRITIN, TIBC, IRON, RETICCTPCT in the last 72 hours. Sepsis Labs: No results for input(s): PROCALCITON, LATICACIDVEN in the last 168 hours.  Recent Results (from the past 240 hour(s))  SARS CORONAVIRUS 2 (TAT 6-24 HRS) Nasopharyngeal Nasopharyngeal Swab     Status: None   Collection Time: 04/22/19  3:46 PM   Specimen: Nasopharyngeal Swab  Result Value Ref Range Status   SARS Coronavirus 2 NEGATIVE NEGATIVE Final    Comment: (NOTE) SARS-CoV-2 target nucleic acids are NOT DETECTED. The SARS-CoV-2 RNA is generally detectable in upper and lower respiratory specimens during the acute phase of infection. Negative results do not preclude SARS-CoV-2 infection, do not rule out co-infections with  other pathogens, and should not be used as the sole basis for treatment or other patient management decisions. Negative results must be combined with clinical observations, patient history, and epidemiological information. The expected result is Negative. Fact Sheet for Patients: SugarRoll.be Fact Sheet for Healthcare Providers: https://www.woods-mathews.com/ This test is not yet approved or cleared by the [place] FDA and  has been authorized for detection and/or diagnosis of SARS-CoV-2 by FDA under an Emergency Use Authorization (EUA). This EUA will remain  in effect (meaning this test can be used) for the duration of the COVID-19 declaration under Section 56 4(b)(1) of the Act, 21 U.S.C. section 360bbb-3(b)(1), unless the authorization is terminated or revoked sooner. Performed at [facility], [place] [address], [place], [place] [address]      Radiology Studies: ECHOCARDIOGRAM COMPLETE  Result Date: 04/23/2019   ECHOCARDIOGRAM REPORT   Patient Name:   LU PARADISE Date of Exam: 04/23/2019 Medical Rec #:  [id number]      Height:  64.0 in Accession #:    [id number]     Weight:       146.2 lb Date of Birth:  [birth year]     BSA:          1.71 m Patient Age:    78 years       BP:           145/75 mmHg Patient Gender: F              HR:           76 bpm. Exam Location:  Inpatient Procedure: 2D Echo, Cardiac Doppler and Color Doppler Indications:    Dyspnea 786.09  History:        Patient has no prior history of Echocardiogram examinations.                 Signs/Symptoms:Dyspnea; Risk Factors:Hypertension and Former                 Smoker. Carotid artery disease.  Sonographer:    Paulita Fujita RDCS Referring Phys: [id number] DAVID MANUEL Gordon  1. Left ventricular ejection fraction, by visual estimation, is 50 to 55%. The left ventricle has hyperdynamic function. There is no left ventricular hypertrophy.  2. Left ventricular  diastolic parameters are consistent with Grade I diastolic dysfunction (impaired relaxation).  3. The left ventricle has no regional wall motion abnormalities.  4. Global right ventricle has normal systolic function.The right ventricular size is normal. No increase in right ventricular wall thickness.  5. Left atrial size was mildly dilated.  6. Right atrial size was normal.  7. Large pleural effusion in the left lateral region.  8. Small pericardial effusion.  9. The pericardial effusion is circumferential. 10. Moderate mitral annular calcification. 11. The mitral valve is abnormal. Mild mitral valve regurgitation. 12. The tricuspid valve is grossly normal. Tricuspid valve regurgitation is trivial. 13. Aortic valve area, by VTI measures 1.47 cm. 14. Aortic valve mean gradient measures 11.0 mmHg. 15. Aortic valve peak gradient measures 19.7 mmHg. 16. The aortic valve is tricuspid. Aortic valve regurgitation is not visualized. Mild to moderate aortic valve stenosis. 17. The pulmonic valve was grossly normal. Pulmonic valve regurgitation is not visualized. 18. Moderately elevated pulmonary artery systolic pressure. 19. The inferior vena cava is normal in size with <50% respiratory variability, suggesting right atrial pressure of 8 mmHg. 20. Evidence of atrial level shunting detected by color flow Doppler. 21. Small patent foramen ovale with predominantly right to left shunting across the atrial septum. FINDINGS  Left Ventricle: Left ventricular ejection fraction, by visual estimation, is 50 to 55%. The left ventricle has hyperdynamic function. The left ventricle has no regional wall motion abnormalities. There is no left ventricular hypertrophy. Left ventricular diastolic parameters are consistent with Grade I diastolic dysfunction (impaired relaxation). Indeterminate filling pressures. Right Ventricle: The right ventricular size is normal. No increase in right ventricular wall thickness. Global RV systolic function  is has normal systolic function. The tricuspid regurgitant velocity is 3.18 m/s, and with an assumed right atrial pressure  of 8 mmHg, the estimated right ventricular systolic pressure is moderately elevated at 48.4 mmHg. Left Atrium: Left atrial size was mildly dilated. Right Atrium: Right atrial size was normal in size Pericardium: A small pericardial effusion is present. The pericardial effusion is circumferential. There is a large pleural effusion in the left lateral region. Mitral Valve: The mitral valve is abnormal. There is mild thickening of the mitral valve leaflet(s). Moderate mitral annular  calcification. Mild mitral valve regurgitation. Tricuspid Valve: The tricuspid valve is grossly normal. Tricuspid valve regurgitation is trivial. Aortic Valve: The aortic valve is tricuspid. Aortic valve regurgitation is not visualized. Mild to moderate aortic stenosis is present. Aortic valve mean gradient measures 11.0 mmHg. Aortic valve peak gradient measures 19.7 mmHg. Aortic valve area, by VTI measures 1.47 cm. Pulmonic Valve: The pulmonic valve was grossly normal. Pulmonic valve regurgitation is not visualized. Pulmonic regurgitation is not visualized. Aorta: The aortic root and ascending aorta are structurally normal, with no evidence of dilitation. Venous: The inferior vena cava is normal in size with less than 50% respiratory variability, suggesting right atrial pressure of 8 mmHg. IAS/Shunts: Evidence of atrial level shunting detected by color flow Doppler. A small patent foramen ovale is detected with predominantly right to left shunting across the atrial septum.  LEFT VENTRICLE PLAX 2D LVIDd:         4.40 cm       Diastology LVIDs:         3.40 cm       LV e' lateral:   7.07 cm/s LV PW:         0.90 cm       LV E/e' lateral: 12.8 LV IVS:        0.90 cm       LV e' medial:    5.87 cm/s LVOT diam:     1.70 cm       LV E/e' medial:  15.4 LV SV:         40 ml LV SV Index:   23.11 LVOT Area:     2.27 cm  LV  Volumes (MOD) LV area d, A2C:    37.80 cm LV area d, A4C:    35.30 cm LV area s, A2C:    23.20 cm LV area s, A4C:    22.40 cm LV major d, A2C:   8.02 cm LV major d, A4C:   7.51 cm LV major s, A2C:   6.51 cm LV major s, A4C:   6.44 cm LV vol d, MOD A2C: 150.0 ml LV vol d, MOD A4C: 133.0 ml LV vol s, MOD A2C: 69.0 ml LV vol s, MOD A4C: 62.5 ml LV SV MOD A2C:     81.0 ml LV SV MOD A4C:     133.0 ml LV SV MOD BP:      79.3 ml RIGHT VENTRICLE RV S prime:     11.70 cm/s TAPSE (M-mode): 2.9 cm LEFT ATRIUM             Index       RIGHT ATRIUM           Index LA diam:        2.50 cm 1.46 cm/m  RA Area:     17.20 cm LA Vol (A2C):   65.7 ml 38.37 ml/m RA Volume:   47.40 ml  27.68 ml/m LA Vol (A4C):   51.5 ml 30.08 ml/m LA Biplane Vol: 59.1 ml 34.52 ml/m  AORTIC VALVE AV Area (Vmax):    1.55 cm AV Area (Vmean):   1.44 cm AV Area (VTI):     1.47 cm AV Vmax:           222.00 cm/s AV Vmean:          159.000 cm/s AV VTI:            0.519 m AV Peak Grad:      19.7 mmHg AV  Mean Grad:      11.0 mmHg LVOT Vmax:         152.00 cm/s LVOT Vmean:        101.000 cm/s LVOT VTI:          0.336 m LVOT/AV VTI ratio: 0.65  AORTA Ao Root diam: 2.80 cm MITRAL VALVE                         TRICUSPID VALVE MV Area (PHT): 5.27 cm              TR Peak grad:   40.4 mmHg MV PHT:        41.76 msec            TR Vmax:        318.00 cm/s MV Decel Time: 144 msec MR Peak grad: 98.0 mmHg              SHUNTS MR Vmax:      495.00 cm/s            Systemic VTI:  0.34 m MV E velocity: 90.40 cm/s  103 cm/s  Systemic Diam: 1.70 cm MV A velocity: 105.00 cm/s 70.3 cm/s MV E/A ratio:  0.86        1.5  Lyman Bishop MD Electronically signed by Lyman Bishop MD Signature Date/Time: 04/23/2019/10:45:06 AM    Final     Scheduled Meds: . aspirin  325 mg Oral Daily  . atorvastatin  80 mg Oral q1800  . ezetimibe  10 mg Oral Daily  . ferrous sulfate  325 mg Oral Q breakfast  . labetalol  50 mg Oral TID  . levothyroxine  88 mcg Oral Q0600  . lisinopril   40 mg Oral BID  . sodium bicarbonate  650 mg Oral BID  . zinc sulfate  220 mg Oral Daily   Continuous Infusions:   LOS: 1 day   Marylu Lund, MD [facility] Pager On Amion  If 7PM-7AM, please contact night-coverage 04/24/2019, 4:21 PM

## 2019-04-24 NOTE — Evaluation (Signed)
Physical Therapy One Time Evaluation Patient Details Name: Wendy Walters MRN: [id number] DOB: 31-May-1932 Today's Date: 04/24/2019   History of Present Illness  85 y.o. female with medical history significant of chronic anemia, bradycardia, stage III chronic kidney disease, history of fibrillary granular nephritis, coronary artery disease, history of lower extremity edema, hyperlipidemia, hypertension, hypothyroidism, osteoporosis and admitted for dyspnea  Clinical Impression  Patient evaluated by Physical Therapy with no further acute PT needs identified. All education has been completed and the patient has no further questions.  See below for any follow-up Physical Therapy or equipment needs. PT is signing off. Thank you for this referral.       Follow Up Recommendations No PT follow up    Equipment Recommendations  None recommended by PT    Recommendations for Other Services       Precautions / Restrictions Precautions Precautions: None Restrictions Weight Bearing Restrictions: No      Mobility  Bed Mobility Overal bed mobility: Modified Independent                Transfers Overall transfer level: Modified independent                  Ambulation/Gait Ambulation/Gait assistance: Supervision;Modified independent (Device/Increase time) Gait Distance (Feet): 280 Feet Assistive device: None Gait Pattern/deviations: Step-through pattern;Decreased stride length     General Gait Details: steady pace, no LOB observed, HR 129 bpm and SpO2 remained 93% or above on room air  Stairs            Wheelchair Mobility    Modified Rankin (Stroke Patients Only)       Balance Overall balance assessment: No apparent balance deficits (not formally assessed)(denies falls)                                           Pertinent Vitals/Pain Pain Assessment: No/denies pain    Home Living Family/patient expects to be discharged to:: Private  residence Living Arrangements: Alone Available Help at Discharge: Family;Available PRN/intermittently(daughter lives in duplex, works during the day) Type of Home: House       Home Layout: One level [place]: None      Prior Function Level of Independence: Independent               Hand Dominance        Extremity/Trunk Assessment        Lower Extremity Assessment Lower Extremity Assessment: Overall WFL for tasks assessed    Cervical / Trunk Assessment Cervical / Trunk Assessment: Normal  Communication   Communication: No difficulties  Cognition Arousal/Alertness: Awake/alert Behavior During Therapy: WFL for tasks assessed/performed Overall Cognitive Status: Within Functional Limits for tasks assessed                                        General Comments      Exercises     Assessment/Plan    PT Assessment Patent does not need any further PT services  PT Problem List         PT Treatment Interventions      PT Goals (Current goals can be found in the Care Plan section)  Acute Rehab PT Goals PT Goal Formulation: All assessment and education complete, DC therapy    Frequency  Barriers to discharge        Co-evaluation               AM-PAC PT "6 Clicks" Mobility  Outcome Measure Help needed turning from your back to your side while in a flat bed without using bedrails?: None Help needed moving from lying on your back to sitting on the side of a flat bed without using bedrails?: None Help needed moving to and from a bed to a chair (including a wheelchair)?: None Help needed standing up from a chair using your arms (e.g., wheelchair or bedside chair)?: None Help needed to walk in hospital room?: None Help needed climbing 3-5 steps with a railing? : A Little 6 Click Score: 23    End of Session   Activity Tolerance: Patient tolerated treatment well Patient left: in chair;with call bell/phone within reach;with  chair alarm set Nurse Communication: Mobility status PT Visit Diagnosis: Difficulty in walking, not elsewhere classified (R26.2)    Time: 2025-4270 PT Time Calculation (min) (ACUTE ONLY): 20 min   Charges:   PT Evaluation $PT Eval Low Complexity: 1 Low     Kati PT, DPT Acute Rehabilitation Services Office: [phone]  Frankey Botting,KATHrine E 04/24/2019, 12:00 PM

## 2019-04-25 LAB — CBC
HCT: 25.2 % — ABNORMAL LOW (ref 36.0–46.0)
Hemoglobin: 7.9 g/dL — ABNORMAL LOW (ref 12.0–15.0)
MCH: 29.8 pg (ref 26.0–34.0)
MCHC: 31.3 g/dL (ref 30.0–36.0)
MCV: 95.1 fL (ref 80.0–100.0)
Platelets: 268 10*3/uL (ref 150–400)
RBC: 2.65 MIL/uL — ABNORMAL LOW (ref 3.87–5.11)
RDW: 13.7 % (ref 11.5–15.5)
WBC: 6 10*3/uL (ref 4.0–10.5)
nRBC: 0 % (ref 0.0–0.2)

## 2019-04-25 LAB — BASIC METABOLIC PANEL
Anion gap: 10 (ref 5–15)
BUN: 43 mg/dL — ABNORMAL HIGH (ref 8–23)
CO2: 23 mmol/L (ref 22–32)
Calcium: 8.2 mg/dL — ABNORMAL LOW (ref 8.9–10.3)
Chloride: 107 mmol/L (ref 98–111)
Creatinine, Ser: 2.73 mg/dL — ABNORMAL HIGH (ref 0.44–1.00)
GFR calc Af Amer: 18 mL/min — ABNORMAL LOW (ref >60)
GFR calc non Af Amer: 15 mL/min — ABNORMAL LOW (ref >60)
Glucose, Bld: 88 mg/dL (ref 70–99)
Potassium: 3.4 mmol/L — ABNORMAL LOW (ref 3.5–5.1)
Sodium: 140 mmol/L (ref 135–145)

## 2019-04-25 NOTE — Discharge Summary (Signed)
Physician Discharge Summary  Wendy Walters AYT:[id number] DOB: [birth year] DOA: 04/22/2019  PCP: Leighton Ruff, MD  Admit date: 04/22/2019 Discharge date: 04/25/2019  Admitted From: Home Disposition:  Home  Recommendations for Outpatient Follow-up:  1. Follow up with PCP in 1-2 weeks 2. Recommend repeat BMET in one week  Discharge Condition:Improved CODE STATUS:Full Diet recommendation: Heart healthy   Brief/Interim Summary: 85 y.o.femalewith medical history significant ofchronic anemia, bradycardia, stage III chronic kidney disease, history of fibrillary granular nephritis, coronary artery disease, history of lower extremity edema, hyperlipidemia, hypertension, hypothyroidism, osteoporosis who is returning to the emergency department due to dyspnea after she was seen 6 days ago for similar complaint, but signed AMA and left due to having a panic attack.She has been having lower extremity edema and abdominal swelling for about a month. She has had some orthopnea. She denies chest pain, dizziness, diaphoresis or palpitations. She denies abdominal pain, nausea, vomiting, diarrhea, melena or hematochezia. No dysuria, frequency or hematuria. No polyuria, polydipsia, polyphagia or blurred vision.  Discharge Diagnoses:  Principal Problem:   Dyspnea Active Problems:   Coronary atherosclerosis of native coronary artery   Pure hypercholesterolemia   Essential hypertension, benign   CKD (chronic kidney disease) stage 3, GFR 30-59 ml/min   Hypothyroidism   CHF (congestive heart failure) (HCC)  Principal Problem: Dyspnea -Continue supplemental oxygen and wean as tolerated -Pt noted improvement following IV lasix -Edema improved. Dyspnea resolved. Cr peaked to 2.9. Will hold further IV lasix -Family present at bedside, aware of patient's underlying chronic edema, states that patient's LE edema has markedly improved -Resume home diuretic on d/c  Active Problems: Coronary  atherosclerosis of native coronary artery -Continue statin, aspirin and labetalol as tolerated -2d echo reviewed personally. Normal LVEF noted  Pure hypercholesterolemia -Continue atorvastatin and ezetimibe -Stable currently  Essential hypertension, benign -CKD (chronic kidney disease) stage 3, GFR 30-59 ml/min -Lasix later held, to resume home diuretic on d/c -Remained stable  Hypothyroidism -Continue levothyroxine 88 mcg p.o. daily as tolerated  CKD stage 4 -Pt follows Nephrology as outpatient -Held further IV lasix given Cr peaked to 2.9. Pt to resume home oral diuretic on d/c  Chronic anemia -Pt with chronic anemia with hgb in the 7 range -Hgb down to 6.5 with no evidence of acute blood loss -Pt otherwise remained hemodynamically stable thus suggesting chronic anemia -Hgb appropriately corrected after one unit PRBC   Discharge Instructions   Allergies as of 04/25/2019      Reactions   Codeine Nausea And Vomiting, Nausea Only   Adhesive [tape] Other (See Comments)   Pulls skin off       Medication List    TAKE these medications   alendronate 70 MG tablet Commonly known as: FOSAMAX Take 70 mg by mouth once a week. Take with a full glass of water on an empty stomach.   aspirin 325 MG tablet Take 325 mg by mouth daily. What changed: Another medication with the same name was removed. Continue taking this medication, and follow the directions you see here.   atorvastatin 80 MG tablet Commonly known as: LIPITOR TAKE 1 TABLET DAILY AT 6 PM. What changed: See the new instructions.   CoQ10 200 MG Caps Take 1 capsule by mouth daily.   ezetimibe 10 MG tablet Commonly known as: ZETIA TAKE 1 TABLET EVERY DAY   GNP Iron 325 (65 FE) MG tablet Generic drug: ferrous sulfate Take 325 mg by mouth daily with breakfast.   labetalol 100 MG tablet Commonly known as:  NORMODYNE Take 50 mg by mouth 3 (three) times daily.   levothyroxine 88 MCG tablet Commonly  known as: SYNTHROID Take 88 mcg by mouth daily before breakfast.   lisinopril 40 MG tablet Commonly known as: ZESTRIL Take 1 tablet (40 mg total) by mouth 2 (two) times daily.   multivitamin tablet Take 1 tablet by mouth daily.   niacin 500 MG tablet Take 500 mg by mouth 2 (two) times daily with a meal.   OMEGA 3 PO Take 1 capsule by mouth 2 (two) times daily.   PROBIOTIC PO Take 1 capsule by mouth every 3 (three) days.   sodium bicarbonate 650 MG tablet Take 650 mg by mouth 2 (two) times daily.   torsemide 20 MG tablet Commonly known as: DEMADEX Take 20 mg by mouth 2 (two) times daily.   triamcinolone cream 0.1 % Commonly known as: KENALOG Apply 1 application topically 2 (two) times daily.   VITAMIN B COMPLEX PO Take 1 tablet by mouth daily.   vitamin C 1000 MG tablet Take 1,000 mg by mouth daily.   zinc gluconate 50 MG tablet Take 50 mg by mouth daily.      Follow-up Information    Leighton Ruff, MD. Schedule an appointment as soon as possible for a visit in 1 week(s).   Specialty: Family Medicine Contact information: [place] [address] [phone]          Allergies  Allergen Reactions  . Codeine Nausea And Vomiting and Nausea Only  . Adhesive [Tape] Other (See Comments)    Pulls skin off     Procedures/Studies: DG Chest 2 View  Result Date: 04/22/2019 CLINICAL DATA:  Continue cough and shortness of breath since last Thursday, history coronary artery disease, hypertension, former smoker EXAM: CHEST - 2 VIEW COMPARISON:  04/16/2019 FINDINGS: Enlargement of cardiac silhouette with pulmonary vascular congestion. Atherosclerotic calcification aorta. Slightly improved interstitial edema. Persistent bibasilar pleural effusions and atelectasis. Underlying emphysematous changes. No segmental consolidation or pneumothorax. Bones demineralized. IMPRESSION: Enlargement of cardiac silhouette with pulmonary vascular congestion. COPD  changes with slightly improved interstitial edema. Persistent small bibasilar pleural effusions and atelectasis. Electronically Signed   By: Lavonia Dana M.D.   On: 04/22/2019 12:20   DG Chest 2 View  Result Date: 04/16/2019 CLINICAL DATA:  Shortness of breath, cough and shortness of breath, denies chest pain fever EXAM: CHEST - 2 VIEW COMPARISON:  Radiograph 02/27/2017, CT 03/05/2015 FINDINGS: Diffuse hazy interstitial opacities with some fissural and septal thickening, cephalized vascularity and central vascular cuffing. Bilateral effusions. No pneumothorax. There is borderline cardiomegaly. The aorta is calcified. Degenerative changes are present in the imaged spine and shoulders. High-riding appearance of the humeral heads may reflect underlying rotator cuff tendinopathy. No acute osseous or soft tissue abnormality. IMPRESSION: 1. Findings compatible with CHF including bilateral effusions, features of edema, and mild cardiomegaly. 2.  Aortic Atherosclerosis (ICD10-I70.0). Electronically Signed   By: Lovena Le M.D.   On: 04/16/2019 20:03   ECHOCARDIOGRAM COMPLETE  Result Date: 04/23/2019   ECHOCARDIOGRAM REPORT   Patient Name:   Wendy Walters Date of Exam: 04/23/2019 Medical Rec #:  [id number]      Height:       64.0 in Accession #:    [id number]     Weight:       146.2 lb Date of Birth:  1932/09/20     BSA:          1.71 m Patient Age:    55  years       BP:           145/75 mmHg Patient Gender: F              HR:           76 bpm. Exam Location:  Inpatient Procedure: 2D Echo, Cardiac Doppler and Color Doppler Indications:    Dyspnea 786.09  History:        Patient has no prior history of Echocardiogram examinations.                 Signs/Symptoms:Dyspnea; Risk Factors:Hypertension and Former                 Smoker. Carotid artery disease.  Sonographer:    Paulita Fujita RDCS Referring Phys: [id number] DAVID MANUEL Elsie  1. Left ventricular ejection fraction, by visual estimation, is 50 to  55%. The left ventricle has hyperdynamic function. There is no left ventricular hypertrophy.  2. Left ventricular diastolic parameters are consistent with Grade I diastolic dysfunction (impaired relaxation).  3. The left ventricle has no regional wall motion abnormalities.  4. Global right ventricle has normal systolic function.The right ventricular size is normal. No increase in right ventricular wall thickness.  5. Left atrial size was mildly dilated.  6. Right atrial size was normal.  7. Large pleural effusion in the left lateral region.  8. Small pericardial effusion.  9. The pericardial effusion is circumferential. 10. Moderate mitral annular calcification. 11. The mitral valve is abnormal. Mild mitral valve regurgitation. 12. The tricuspid valve is grossly normal. Tricuspid valve regurgitation is trivial. 13. Aortic valve area, by VTI measures 1.47 cm. 14. Aortic valve mean gradient measures 11.0 mmHg. 15. Aortic valve peak gradient measures 19.7 mmHg. 16. The aortic valve is tricuspid. Aortic valve regurgitation is not visualized. Mild to moderate aortic valve stenosis. 17. The pulmonic valve was grossly normal. Pulmonic valve regurgitation is not visualized. 18. Moderately elevated pulmonary artery systolic pressure. 19. The inferior vena cava is normal in size with <50% respiratory variability, suggesting right atrial pressure of 8 mmHg. 20. Evidence of atrial level shunting detected by color flow Doppler. 21. Small patent foramen ovale with predominantly right to left shunting across the atrial septum. FINDINGS  Left Ventricle: Left ventricular ejection fraction, by visual estimation, is 50 to 55%. The left ventricle has hyperdynamic function. The left ventricle has no regional wall motion abnormalities. There is no left ventricular hypertrophy. Left ventricular diastolic parameters are consistent with Grade I diastolic dysfunction (impaired relaxation). Indeterminate filling pressures. Right Ventricle:  The right ventricular size is normal. No increase in right ventricular wall thickness. Global RV systolic function is has normal systolic function. The tricuspid regurgitant velocity is 3.18 m/s, and with an assumed right atrial pressure  of 8 mmHg, the estimated right ventricular systolic pressure is moderately elevated at 48.4 mmHg. Left Atrium: Left atrial size was mildly dilated. Right Atrium: Right atrial size was normal in size Pericardium: A small pericardial effusion is present. The pericardial effusion is circumferential. There is a large pleural effusion in the left lateral region. Mitral Valve: The mitral valve is abnormal. There is mild thickening of the mitral valve leaflet(s). Moderate mitral annular calcification. Mild mitral valve regurgitation. Tricuspid Valve: The tricuspid valve is grossly normal. Tricuspid valve regurgitation is trivial. Aortic Valve: The aortic valve is tricuspid. Aortic valve regurgitation is not visualized. Mild to moderate aortic stenosis is present. Aortic valve mean gradient measures 11.0 mmHg. Aortic valve peak  gradient measures 19.7 mmHg. Aortic valve area, by VTI measures 1.47 cm. Pulmonic Valve: The pulmonic valve was grossly normal. Pulmonic valve regurgitation is not visualized. Pulmonic regurgitation is not visualized. Aorta: The aortic root and ascending aorta are structurally normal, with no evidence of dilitation. Venous: The inferior vena cava is normal in size with less than 50% respiratory variability, suggesting right atrial pressure of 8 mmHg. IAS/Shunts: Evidence of atrial level shunting detected by color flow Doppler. A small patent foramen ovale is detected with predominantly right to left shunting across the atrial septum.  LEFT VENTRICLE PLAX 2D LVIDd:         4.40 cm       Diastology LVIDs:         3.40 cm       LV e' lateral:   7.07 cm/s LV PW:         0.90 cm       LV E/e' lateral: 12.8 LV IVS:        0.90 cm       LV e' medial:    5.87 cm/s LVOT  diam:     1.70 cm       LV E/e' medial:  15.4 LV SV:         40 ml LV SV Index:   23.11 LVOT Area:     2.27 cm  LV Volumes (MOD) LV area d, A2C:    37.80 cm LV area d, A4C:    35.30 cm LV area s, A2C:    23.20 cm LV area s, A4C:    22.40 cm LV major d, A2C:   8.02 cm LV major d, A4C:   7.51 cm LV major s, A2C:   6.51 cm LV major s, A4C:   6.44 cm LV vol d, MOD A2C: 150.0 ml LV vol d, MOD A4C: 133.0 ml LV vol s, MOD A2C: 69.0 ml LV vol s, MOD A4C: 62.5 ml LV SV MOD A2C:     81.0 ml LV SV MOD A4C:     133.0 ml LV SV MOD BP:      79.3 ml RIGHT VENTRICLE RV S prime:     11.70 cm/s TAPSE (M-mode): 2.9 cm LEFT ATRIUM             Index       RIGHT ATRIUM           Index LA diam:        2.50 cm 1.46 cm/m  RA Area:     17.20 cm LA Vol (A2C):   65.7 ml 38.37 ml/m RA Volume:   47.40 ml  27.68 ml/m LA Vol (A4C):   51.5 ml 30.08 ml/m LA Biplane Vol: 59.1 ml 34.52 ml/m  AORTIC VALVE AV Area (Vmax):    1.55 cm AV Area (Vmean):   1.44 cm AV Area (VTI):     1.47 cm AV Vmax:           222.00 cm/s AV Vmean:          159.000 cm/s AV VTI:            0.519 m AV Peak Grad:      19.7 mmHg AV Mean Grad:      11.0 mmHg LVOT Vmax:         152.00 cm/s LVOT Vmean:        101.000 cm/s LVOT VTI:          0.336 m LVOT/AV VTI ratio:  0.74  AORTA Ao Root diam: 2.80 cm MITRAL VALVE                         TRICUSPID VALVE MV Area (PHT): 5.27 cm              TR Peak grad:   40.4 mmHg MV PHT:        41.76 msec            TR Vmax:        318.00 cm/s MV Decel Time: 144 msec MR Peak grad: 98.0 mmHg              SHUNTS MR Vmax:      495.00 cm/s            Systemic VTI:  0.34 m MV E velocity: 90.40 cm/s  103 cm/s  Systemic Diam: 1.70 cm MV A velocity: 105.00 cm/s 70.3 cm/s MV E/A ratio:  0.86        1.5  Lyman Bishop MD Electronically signed by Lyman Bishop MD Signature Date/Time: 04/23/2019/10:45:06 AM    Final      Subjective: Eager to go home  Discharge Exam: Vitals:   04/25/19 0636 04/25/19 0931  BP: (!) 176/81   Pulse: 70 71   Resp: 18   Temp: 97.9 F (36.6 C)   SpO2: 100%    Vitals:   04/24/19 2309 04/25/19 0229 04/25/19 0636 04/25/19 0931  BP: (!) 155/80 (!) 176/80 (!) 176/81   Pulse: 78 69 70 71  Resp: 18 16 18    Temp: 98.6 F (37 C) 97.9 F (36.6 C) 97.9 F (36.6 C)   TempSrc: Oral Oral Oral   SpO2: 97% 96% 100%   Weight:   65.1 kg   Height:        General: Pt is alert, awake, not in acute distress Cardiovascular: RRR, S1/S2 +, no rubs, no gallops Respiratory: CTA bilaterally, no wheezing, no rhonchi Abdominal: Soft, NT, ND, bowel sounds + Extremities: no edema, no cyanosis   The results of significant diagnostics from this hospitalization (including imaging, microbiology, ancillary and laboratory) are listed below for reference.     Microbiology: Recent Results (from the past 240 hour(s))  SARS CORONAVIRUS 2 (TAT 6-24 HRS) Nasopharyngeal Nasopharyngeal Swab     Status: None   Collection Time: 04/22/19  3:46 PM   Specimen: Nasopharyngeal Swab  Result Value Ref Range Status   SARS Coronavirus 2 NEGATIVE NEGATIVE Final    Comment: (NOTE) SARS-CoV-2 target nucleic acids are NOT DETECTED. The SARS-CoV-2 RNA is generally detectable in upper and lower respiratory specimens during the acute phase of infection. Negative results do not preclude SARS-CoV-2 infection, do not rule out co-infections with other pathogens, and should not be used as the sole basis for treatment or other patient management decisions. Negative results must be combined with clinical observations, patient history, and epidemiological information. The expected result is Negative. Fact Sheet for Patients: SugarRoll.be Fact Sheet for Healthcare Providers: https://www.woods-mathews.com/ This test is not yet approved or cleared by the [place] FDA and  has been authorized for detection and/or diagnosis of SARS-CoV-2 by FDA under an Emergency Use Authorization (EUA). This EUA  will remain  in effect (meaning this test can be used) for the duration of the COVID-19 declaration under Section 56 4(b)(1) of the Act, 21 U.S.C. section 360bbb-3(b)(1), unless the authorization is terminated or revoked sooner. Performed at [facility], [place] [address]  [place], [place] [address]      Labs: BNP (last 3 results) Recent Labs    04/16/19 2113 04/22/19 1052  BNP 1,103.4* 220.2*   Basic Metabolic Panel: Recent Labs  Lab 04/22/19 1052 04/22/19 1314 04/23/19 0512 04/24/19 0512 04/25/19 0538  NA 140  --  141 140 140  K 3.8  --  3.9 3.7 3.4*  CL 110  --  109 109 107  CO2 22  --  20* 21* 23  GLUCOSE 93  --  73 85 88  BUN 45*  --  44* 45* 43*  CREATININE 2.85*  --  2.81* 2.92* 2.73*  CALCIUM 8.7*  --  8.4* 8.3* 8.2*  MG  --  1.8  --   --   --   PHOS  --  5.7*  --   --   --    Liver Function Tests: Recent Labs  Lab 04/22/19 1052  AST 22  ALT 19  ALKPHOS 79  BILITOT 0.6  PROT 5.5*  ALBUMIN 2.5*   No results for input(s): LIPASE, AMYLASE in the last 168 hours. No results for input(s): AMMONIA in the last 168 hours. CBC: Recent Labs  Lab 04/22/19 1052 04/23/19 0512 04/24/19 0512 04/25/19 0538  WBC 5.4 4.8 4.8 6.0  NEUTROABS 3.7  --   --   --   HGB 7.1* 6.5* 6.5* 7.9*  HCT 23.0* 21.5* 21.3* 25.2*  MCV 97.5 98.2 98.6 95.1  PLT 272 266 273 268   Cardiac Enzymes: No results for input(s): CKTOTAL, CKMB, CKMBINDEX, TROPONINI in the last 168 hours. BNP: Invalid input(s): POCBNP CBG: No results for input(s): GLUCAP in the last 168 hours. D-Dimer No results for input(s): DDIMER in the last 72 hours. Hgb A1c No results for input(s): HGBA1C in the last 72 hours. Lipid Profile No results for input(s): CHOL, HDL, LDLCALC, TRIG, CHOLHDL, LDLDIRECT in the last 72 hours. Thyroid function studies No results for input(s): TSH, T4TOTAL, T3FREE, THYROIDAB in the last 72 hours.  Invalid input(s): FREET3 Anemia work up No results for input(s):  VITAMINB12, FOLATE, FERRITIN, TIBC, IRON, RETICCTPCT in the last 72 hours. Urinalysis    Component Value Date/Time   COLORURINE YELLOW 04/22/2019 1314   APPEARANCEUR CLEAR 04/22/2019 1314   LABSPEC 1.010 04/22/2019 1314   PHURINE 5.0 04/22/2019 1314   GLUCOSEU NEGATIVE 04/22/2019 1314   HGBUR NEGATIVE 04/22/2019 1314   BILIRUBINUR NEGATIVE 04/22/2019 1314   KETONESUR NEGATIVE 04/22/2019 1314   PROTEINUR 100 (A) 04/22/2019 1314   NITRITE NEGATIVE 04/22/2019 1314   LEUKOCYTESUR NEGATIVE 04/22/2019 1314   Sepsis Labs Invalid input(s): PROCALCITONIN,  WBC,  LACTICIDVEN Microbiology Recent Results (from the past 240 hour(s))  SARS CORONAVIRUS 2 (TAT 6-24 HRS) Nasopharyngeal Nasopharyngeal Swab     Status: None   Collection Time: 04/22/19  3:46 PM   Specimen: Nasopharyngeal Swab  Result Value Ref Range Status   SARS Coronavirus 2 NEGATIVE NEGATIVE Final    Comment: (NOTE) SARS-CoV-2 target nucleic acids are NOT DETECTED. The SARS-CoV-2 RNA is generally detectable in upper and lower respiratory specimens during the acute phase of infection. Negative results do not preclude SARS-CoV-2 infection, do not rule out co-infections with other pathogens, and should not be used as the sole basis for treatment or other patient management decisions. Negative results must be combined with clinical observations, patient history, and epidemiological information. The expected result is Negative. Fact Sheet for Patients: SugarRoll.be Fact Sheet for Healthcare Providers: https://www.woods-mathews.com/ This test is not yet approved or cleared by  the [employer] and  has been authorized for detection and/or diagnosis of SARS-CoV-2 by FDA under an Emergency Use Authorization (EUA). This EUA will remain  in effect (meaning this test can be used) for the duration of the COVID-19 declaration under Section 56 4(b)(1) of the Act, 21 U.S.C. section  360bbb-3(b)(1), unless the authorization is terminated or revoked sooner. Performed at [facility], [place] [address], [place], [place] [address]    Time spent: 30 min  SIGNED:   Marylu Lund, MD  [facility] 04/25/2019, 11:43 AM  If 7PM-7AM, please contact night-coverage

## 2019-04-25 NOTE — Progress Notes (Signed)
Patient given discharge instructions, and verbalized an understanding of all discharge instructions.  Patient agrees with discharge plan, and is being discharged in stable medical condition.  Patient to be given transportation via wheelchair.

## 2019-04-26 LAB — BPAM RBC
Blood Product Expiration Date: 202101192359
ISSUE DATE / TIME: 202012182247
Unit Type and Rh: 5100

## 2019-04-26 LAB — TYPE AND SCREEN
ABO/RH(D): O POS
Antibody Screen: NEGATIVE
Unit division: 0

## 2019-05-05 ENCOUNTER — Telehealth: Payer: Self-pay | Admitting: Cardiology

## 2019-05-05 NOTE — Telephone Encounter (Signed)
Dr. Drema Dallas office called to advise Dr. Radford Pax that they faxed over labs and that patient was recently hospitalized. She has congestive heart failure and kidney failure. The office was just making Dr. Radford Pax aware in case she need to make a follow up appt.

## 2019-05-06 NOTE — Telephone Encounter (Signed)
Left a message for patient to call back to schedule a hospital FU visit with Dr. Radford Pax next week.

## 2019-05-14 ENCOUNTER — Telehealth: Payer: Self-pay

## 2019-05-14 ENCOUNTER — Telehealth (INDEPENDENT_AMBULATORY_CARE_PROVIDER_SITE_OTHER): Payer: Medicare Other | Admitting: Cardiology

## 2019-05-14 ENCOUNTER — Other Ambulatory Visit: Payer: Self-pay

## 2019-05-14 VITALS — BP 151/75 | HR 74 | Ht 64.0 in | Wt 140.0 lb

## 2019-05-14 DIAGNOSIS — R600 Localized edema: Secondary | ICD-10-CM | POA: Diagnosis not present

## 2019-05-14 DIAGNOSIS — I5032 Chronic diastolic (congestive) heart failure: Secondary | ICD-10-CM | POA: Diagnosis not present

## 2019-05-14 DIAGNOSIS — I10 Essential (primary) hypertension: Secondary | ICD-10-CM | POA: Diagnosis not present

## 2019-05-14 DIAGNOSIS — N1832 Chronic kidney disease, stage 3b: Secondary | ICD-10-CM | POA: Diagnosis not present

## 2019-05-14 DIAGNOSIS — I2510 Atherosclerotic heart disease of native coronary artery without angina pectoris: Secondary | ICD-10-CM

## 2019-05-14 DIAGNOSIS — I6523 Occlusion and stenosis of bilateral carotid arteries: Secondary | ICD-10-CM

## 2019-05-14 DIAGNOSIS — D631 Anemia in chronic kidney disease: Secondary | ICD-10-CM | POA: Diagnosis not present

## 2019-05-14 DIAGNOSIS — N183 Chronic kidney disease, stage 3 unspecified: Secondary | ICD-10-CM | POA: Diagnosis not present

## 2019-05-14 NOTE — Telephone Encounter (Signed)
Per Dr. Radford Pax, called patient to arrange lipid panel.  Left message to call back.

## 2019-05-14 NOTE — Patient Instructions (Addendum)
Medication Instructions:  Your provider recommends that you continue on your current medications as directed. Please refer to the Current Medication list given to you today.    Labwork: Your provider recommends that you return for fasting lab work.  Follow-Up: Your provider wants you to follow-up in: 6 months with Dr. Radford Pax. You will receive a reminder letter in the mail two months in advance. If you don't receive a letter, please call our office to schedule the follow-up appointment.

## 2019-05-14 NOTE — Telephone Encounter (Signed)
    Virtual Visit Pre-Appointment Phone Call  "(Name), I am calling you today to discuss your upcoming appointment. We are currently trying to limit exposure to the virus that causes COVID-19 by seeing patients at home rather than in the office." Confirm consent - "In the setting of the current Covid19 crisis, you are scheduled for a (phone or video) visit with your provider on (date) at (time).  Just as we do with many in-office visits, in order for you to participate in this visit, we must obtain consent.  If you'd like, I can send this to your mychart (if signed up) or email for you to review.  Otherwise, I can obtain your verbal consent now.  All virtual visits are billed to your insurance company just like a normal visit would be.  By agreeing to a virtual visit, we'd like you to understand that the technology does not allow for your provider to perform an examination, and thus may limit your provider's ability to fully assess your condition. If your provider identifies any concerns that need to be evaluated in person, we will make arrangements to do so.  Finally, though the technology is pretty good, we cannot assure that it will always work on either your or our end, and in the setting of a video visit, we may have to convert it to a phone-only visit.  In either situation, we cannot ensure that we have a secure connection.  Are you willing to proceed?" STAFF: Did the patient verbally acknowledge consent to telehealth visit? Document YES/NO here: YES   TELEPHONE CALL NOTE  Wendy Walters has been deemed a candidate for a follow-up tele-health visit to limit community exposure during the Covid-19 pandemic. I spoke with the patient via phone to ensure availability of phone/video source, confirm preferred email & phone number, and discuss instructions and expectations.  I reminded Wendy Walters to be prepared with any vital sign and/or heart rhythm information that could potentially be obtained via  home monitoring, at the time of her visit. I reminded Wendy Walters to expect a phone call prior to her visit.

## 2019-05-14 NOTE — Progress Notes (Signed)
Virtual Visit via Telephone Note   This visit type was conducted due to national recommendations for restrictions regarding the COVID-19 Pandemic (e.g. social distancing) in an effort to limit this patient's exposure and mitigate transmission in our community.  Due to her co-morbid illnesses, this patient is at least at moderate risk for complications without adequate follow up.  This format is felt to be most appropriate for this patient at this time.  The patient did not have access to video technology/had technical difficulties with video requiring transitioning to audio format only (telephone).  All issues noted in this document were discussed and addressed.  No physical exam could be performed with this format.  Please refer to the patient's chart for her  consent to telehealth for [facility].   Evaluation Performed:  Follow-up visit  This visit type was conducted due to national recommendations for restrictions regarding the COVID-19 Pandemic (e.g. social distancing).  This format is felt to be most appropriate for this patient at this time.  All issues noted in this document were discussed and addressed.  No physical exam was performed (except for noted visual exam findings with Video Visits).  Please refer to the patient's chart (MyChart message for video visits and phone note for telephone visits) for the patient's consent to telehealth for [facility].  Date:  11/04/624   ID:  Wendy Walters, DOB [birth year], MRN [id number]  Patient Location:  Home  Provider location:   [place]  PCP:  Leighton Ruff, MD  Cardiologist:  Fransico Him, MD  Electrophysiologist:  None   Chief Complaint:  CAD, HTN, HLD  History of Present Illness:    Wendy Walters is a 86 y.o. female who presents via audio/video conferencing for a telehealth visit today.    Wendy Springeris a 84X.o.femalewith a hx of ASCAD s/p PCI of LAD 2001 in setting of AWMI and cath 2005 with patent stent to the  LAD, dyslipidemia, carotid artery stenosis and HTN.   She was hospitalized last month with SOB and LE edema due to acute diastolic CHF with large left pleural effusion and trivial pericardial effusion. She said that she had had LE edema for several months prior to admission.  2D echo showed low normal LVF with EF 93-71% with diastolic dysfunction.  She was very anemic with a Hbg of 6.5.  Her creatinine bumped to 2.9 with diuresis.  She has CKD and is followed by nephrology.  She was given PRBCs during her hospitalization. She was discharged on Torsemide 20mg  BID and Iron suppl.    She is here today for followup and is doing well.  Her SOB is much improved and she thinks it is back to baseline.  She is able to do housework without any problems.  She still has chronic LE edema which is down when she is up and as she is on her feet it increases.  ShShe denies any chest pain or pressure,  PND, orthopnea, dizziness, palpitations or syncope. She is compliant with her meds and is tolerating meds with no SE.    The patient does not have symptoms concerning for COVID-19 infection (fever, chills, cough, or new shortness of breath).   Prior CV studies:   The following studies were reviewed today:  none  Past Medical History:  Diagnosis Date  . Anemia   . Anemia of chronic disease 02/04/2017  . Bradycardia 08/22/2015  . Carotid stenosis    1-39% stenosis  . Chronic kidney disease    stage 3  .  Coronary artery disease    s/p PCI 2001 of lad in setting of AWMI-rotational atherectomy with BMS -Dr Radford Pax  . Edema extremities 08/21/2016  . Fibrillary glomerulonephritis    w nephrotic range protenuria, following w neurology, managed with an ACE-I and ARB  . H/O Doppler ultrasound 01/2003   ,39% bilateral carotid stenosis.  . Hyperlipidemia   . Hypertension   . Hypothyroidism   . Osteoporosis    s/p actonel for about 7-8 years   Past Surgical History:  Procedure Laterality Date  . ANGIOPLASTY  2001  .  CARDIAC CATHETERIZATION  2005   patent LAD stent  . CHOLECYSTECTOMY N/A 02/07/2017   Procedure: LAPAROSCOPIC CHOLECYSTECTOMY WITH INTRAOPERATIVE CHOLANGIOGRAM;  Surgeon: Armandina Gemma, MD;  Location: [facility];  Service: General;  Laterality: N/A;  . EYE SURGERY Bilateral 2012   Cataract  . TONSILLECTOMY  1942     Current Meds  Medication Sig  . alendronate (FOSAMAX) 70 MG tablet Take 70 mg by mouth once a week. Take with a full glass of water on an empty stomach.  . Ascorbic Acid (VITAMIN C) 1000 MG tablet Take 1,000 mg by mouth daily.  Marland Kitchen aspirin 325 MG tablet Take 325 mg by mouth daily.  Marland Kitchen atorvastatin (LIPITOR) 80 MG tablet TAKE 1 TABLET DAILY AT 6 PM. (Patient taking differently: Take 80 mg by mouth daily at 6 PM. )  . B Complex Vitamins (VITAMIN B COMPLEX PO) Take 1 tablet by mouth daily.   . Coenzyme Q10 (COQ10) 200 MG CAPS Take 1 capsule by mouth daily.   Marland Kitchen ezetimibe (ZETIA) 10 MG tablet TAKE 1 TABLET EVERY DAY  . ferrous sulfate (GNP IRON) 325 (65 FE) MG tablet Take 325 mg by mouth daily with breakfast.  . labetalol (NORMODYNE) 100 MG tablet Take 50 mg by mouth 3 (three) times daily.   Marland Kitchen levothyroxine (SYNTHROID, LEVOTHROID) 88 MCG tablet Take 88 mcg by mouth daily before breakfast.  . lisinopril (PRINIVIL,ZESTRIL) 40 MG tablet Take 1 tablet (40 mg total) by mouth 2 (two) times daily.  . Multiple Vitamin (MULTIVITAMIN) tablet Take 1 tablet by mouth daily.  . niacin 500 MG tablet Take 500 mg by mouth 2 (two) times daily with a meal.  . Omega-3 Fatty Acids (OMEGA 3 PO) Take 1 capsule by mouth 2 (two) times daily.  . Probiotic Product (PROBIOTIC PO) Take 1 capsule by mouth every 3 (three) days.  . sodium bicarbonate 650 MG tablet Take 650 mg by mouth 2 (two) times daily.  Marland Kitchen torsemide (DEMADEX) 20 MG tablet Take 20 mg by mouth 2 (two) times daily.  Marland Kitchen triamcinolone cream (KENALOG) 0.1 % Apply 1 application topically 2 (two) times daily.  Marland Kitchen zinc gluconate 50 MG tablet Take 50 mg by mouth  daily.     Allergies:   Codeine and Adhesive [tape]   Social History   Tobacco Use  . Smoking status: Former Smoker    Quit date: 06/08/1970    Years since quitting: 48.9  . Smokeless tobacco: Never Used  . Tobacco comment: quit in 1972  Substance Use Topics  . Alcohol use: No    Comment: quit in her 56s  . Drug use: No     Family Hx: The patient's family history includes Cancer in her father; Cancer - Prostate in her father; Diabetes in her daughter; Hypertension in her daughter and mother.  ROS:   Please see the history of present illness.     All other systems reviewed and are  negative.   Labs/Other Tests and Data Reviewed:    Recent Labs: 04/22/2019: ALT 19; B Natriuretic Peptide 746.7; Magnesium 1.8 04/25/2019: BUN 43; Creatinine, Ser 2.73; Hemoglobin 7.9; Platelets 268; Potassium 3.4; Sodium 140   Recent Lipid Panel Lab Results  Component Value Date/Time   CHOL 162 02/09/2019 09:02 AM   TRIG 109 02/09/2019 09:02 AM   HDL 78 02/09/2019 09:02 AM   CHOLHDL 2.1 02/09/2019 09:02 AM   CHOLHDL 2.4 04/13/2016 09:35 AM   LDLCALC 65 02/09/2019 09:02 AM    Wt Readings from Last 3 Encounters:  05/14/19 140 lb (63.5 kg)  04/25/19 143 lb 8.3 oz (65.1 kg)  04/16/19 146 lb (66.2 kg)     Objective:    Vital Signs:  BP (!) 151/75   Pulse 74   Ht 5\' 4"  (1.626 m)   Wt 140 lb (63.5 kg)   BMI 24.03 kg/m     ASSESSMENT & PLAN:    1.  ASCAD -s/p PCI of LAD 2001 in setting of AWMI and cath 2005 with patent stent to the LAD. -she has not had any anginal sx -continue ASA and statin.  2.  HTN -BP borderline controlled on exam today -I will have her nephrologist address further change in BP meds -continue Labetalol 100mg  TID, Lisinopril 40mg  BID  3.  Bilateral carotid artery stenosis -dopplers 05/2018 showed 1-39% bilateral stenosis.  -continue statin and ASA  4.  HLD -LDL goal < 70 -continue Atorvastatin 80mg  daily and Zetia 10mg  daily -check FLP and ALT from  PCP  5.  Chronic LE edema -controlled on diuretics -continue Torsemide 20mg  BID  6.  CKD stage 3b -followed by PAP and nephrologist in [place] -creatinine was 2.73 in 04/2019 -she is getting labs done today and sees her nephrologist next week  7.  Chronic diastolic CHF -recent exacerbation last month likely related to severe anemia and CKD -seems back to baseline  -continue on diuretics   COVID-19 Education: The signs and symptoms of COVID-19 were discussed with the patient and how to seek care for testing (follow up with PCP or arrange E-visit).  The importance of social distancing was discussed today.  Patient Risk:   After full review of this patient's clinical status, I feel that they are at least moderate risk at this time.  Time:   Today, I have spent 20 minutes directly with the patient on telemedicine discussing medical problems including CAD, HTN, HLD, LE edema, CKD.  We also reviewed the symptoms of COVID 19 and the ways to protect against contracting the virus with telehealth technology.  I spent an additional 5 minutes reviewing patient's chart including labs.  Medication Adjustments/Labs and Tests Ordered: Current medicines are reviewed at length with the patient today.  Concerns regarding medicines are outlined above.  Tests Ordered: No orders of the defined types were placed in this encounter.  Medication Changes: No orders of the defined types were placed in this encounter.   Disposition:  Follow up in 6 month(s)  Signed, Fransico Him, MD  05/14/2019 9:28 AM    [facility]

## 2019-05-18 ENCOUNTER — Other Ambulatory Visit: Payer: Medicare Other | Admitting: *Deleted

## 2019-05-18 ENCOUNTER — Other Ambulatory Visit: Payer: Self-pay

## 2019-05-18 DIAGNOSIS — N183 Chronic kidney disease, stage 3 unspecified: Secondary | ICD-10-CM | POA: Diagnosis not present

## 2019-05-18 DIAGNOSIS — N390 Urinary tract infection, site not specified: Secondary | ICD-10-CM | POA: Diagnosis not present

## 2019-05-18 DIAGNOSIS — D631 Anemia in chronic kidney disease: Secondary | ICD-10-CM | POA: Diagnosis not present

## 2019-05-18 DIAGNOSIS — I2510 Atherosclerotic heart disease of native coronary artery without angina pectoris: Secondary | ICD-10-CM

## 2019-05-18 DIAGNOSIS — I10 Essential (primary) hypertension: Secondary | ICD-10-CM | POA: Diagnosis not present

## 2019-05-18 LAB — LIPID PANEL
Chol/HDL Ratio: 1.7 ratio (ref 0.0–4.4)
Cholesterol, Total: 127 mg/dL (ref 100–199)
HDL: 74 mg/dL (ref >39)
LDL Chol Calc (NIH): 39 mg/dL (ref 0–99)
Triglycerides: 68 mg/dL (ref 0–149)
VLDL Cholesterol Cal: 14 mg/dL (ref 5–40)

## 2019-05-29 DIAGNOSIS — E039 Hypothyroidism, unspecified: Secondary | ICD-10-CM | POA: Diagnosis not present

## 2019-05-29 DIAGNOSIS — E7800 Pure hypercholesterolemia, unspecified: Secondary | ICD-10-CM | POA: Diagnosis not present

## 2019-05-29 DIAGNOSIS — I10 Essential (primary) hypertension: Secondary | ICD-10-CM | POA: Diagnosis not present

## 2019-05-29 DIAGNOSIS — I252 Old myocardial infarction: Secondary | ICD-10-CM | POA: Diagnosis not present

## 2019-05-29 DIAGNOSIS — N059 Unspecified nephritic syndrome with unspecified morphologic changes: Secondary | ICD-10-CM | POA: Diagnosis not present

## 2019-05-29 DIAGNOSIS — M810 Age-related osteoporosis without current pathological fracture: Secondary | ICD-10-CM | POA: Diagnosis not present

## 2019-05-29 DIAGNOSIS — D638 Anemia in other chronic diseases classified elsewhere: Secondary | ICD-10-CM | POA: Diagnosis not present

## 2019-05-29 DIAGNOSIS — I2510 Atherosclerotic heart disease of native coronary artery without angina pectoris: Secondary | ICD-10-CM | POA: Diagnosis not present

## 2019-05-29 DIAGNOSIS — I509 Heart failure, unspecified: Secondary | ICD-10-CM | POA: Diagnosis not present

## 2019-06-01 DIAGNOSIS — N183 Chronic kidney disease, stage 3 unspecified: Secondary | ICD-10-CM | POA: Diagnosis not present

## 2019-06-01 DIAGNOSIS — I10 Essential (primary) hypertension: Secondary | ICD-10-CM | POA: Diagnosis not present

## 2019-06-01 DIAGNOSIS — D649 Anemia, unspecified: Secondary | ICD-10-CM | POA: Diagnosis not present

## 2019-06-01 DIAGNOSIS — D631 Anemia in chronic kidney disease: Secondary | ICD-10-CM | POA: Diagnosis not present

## 2019-06-24 DIAGNOSIS — N183 Chronic kidney disease, stage 3 unspecified: Secondary | ICD-10-CM | POA: Diagnosis not present

## 2019-06-24 DIAGNOSIS — D631 Anemia in chronic kidney disease: Secondary | ICD-10-CM | POA: Diagnosis not present

## 2019-06-24 DIAGNOSIS — I10 Essential (primary) hypertension: Secondary | ICD-10-CM | POA: Diagnosis not present

## 2019-06-29 DIAGNOSIS — H52221 Regular astigmatism, right eye: Secondary | ICD-10-CM | POA: Diagnosis not present

## 2019-06-29 DIAGNOSIS — H5211 Myopia, right eye: Secondary | ICD-10-CM | POA: Diagnosis not present

## 2019-06-29 DIAGNOSIS — H35373 Puckering of macula, bilateral: Secondary | ICD-10-CM | POA: Diagnosis not present

## 2019-06-29 DIAGNOSIS — D3131 Benign neoplasm of right choroid: Secondary | ICD-10-CM | POA: Diagnosis not present

## 2019-06-29 DIAGNOSIS — H52222 Regular astigmatism, left eye: Secondary | ICD-10-CM | POA: Diagnosis not present

## 2019-06-29 DIAGNOSIS — H26493 Other secondary cataract, bilateral: Secondary | ICD-10-CM | POA: Diagnosis not present

## 2019-06-29 DIAGNOSIS — H524 Presbyopia: Secondary | ICD-10-CM | POA: Diagnosis not present

## 2019-08-11 DIAGNOSIS — N183 Chronic kidney disease, stage 3 unspecified: Secondary | ICD-10-CM | POA: Diagnosis not present

## 2019-08-11 DIAGNOSIS — I10 Essential (primary) hypertension: Secondary | ICD-10-CM | POA: Diagnosis not present

## 2019-08-12 DIAGNOSIS — I10 Essential (primary) hypertension: Secondary | ICD-10-CM | POA: Diagnosis not present

## 2019-08-12 DIAGNOSIS — D631 Anemia in chronic kidney disease: Secondary | ICD-10-CM | POA: Diagnosis not present

## 2019-08-12 DIAGNOSIS — N183 Chronic kidney disease, stage 3 unspecified: Secondary | ICD-10-CM | POA: Diagnosis not present

## 2019-08-12 DIAGNOSIS — N390 Urinary tract infection, site not specified: Secondary | ICD-10-CM | POA: Diagnosis not present

## 2019-08-21 DIAGNOSIS — I10 Essential (primary) hypertension: Secondary | ICD-10-CM | POA: Diagnosis not present

## 2019-08-21 DIAGNOSIS — M810 Age-related osteoporosis without current pathological fracture: Secondary | ICD-10-CM | POA: Diagnosis not present

## 2019-08-21 DIAGNOSIS — N183 Chronic kidney disease, stage 3 unspecified: Secondary | ICD-10-CM | POA: Diagnosis not present

## 2019-08-21 DIAGNOSIS — I2510 Atherosclerotic heart disease of native coronary artery without angina pectoris: Secondary | ICD-10-CM | POA: Diagnosis not present

## 2019-08-21 DIAGNOSIS — E7800 Pure hypercholesterolemia, unspecified: Secondary | ICD-10-CM | POA: Diagnosis not present

## 2019-08-21 DIAGNOSIS — D638 Anemia in other chronic diseases classified elsewhere: Secondary | ICD-10-CM | POA: Diagnosis not present

## 2019-08-21 DIAGNOSIS — N059 Unspecified nephritic syndrome with unspecified morphologic changes: Secondary | ICD-10-CM | POA: Diagnosis not present

## 2019-08-21 DIAGNOSIS — E039 Hypothyroidism, unspecified: Secondary | ICD-10-CM | POA: Diagnosis not present

## 2019-08-21 DIAGNOSIS — I509 Heart failure, unspecified: Secondary | ICD-10-CM | POA: Diagnosis not present

## 2019-08-21 DIAGNOSIS — I252 Old myocardial infarction: Secondary | ICD-10-CM | POA: Diagnosis not present

## 2019-08-31 ENCOUNTER — Telehealth: Payer: Self-pay | Admitting: Cardiology

## 2019-08-31 DIAGNOSIS — R072 Precordial pain: Secondary | ICD-10-CM

## 2019-08-31 DIAGNOSIS — R079 Chest pain, unspecified: Secondary | ICD-10-CM | POA: Diagnosis not present

## 2019-08-31 DIAGNOSIS — R0789 Other chest pain: Secondary | ICD-10-CM | POA: Diagnosis not present

## 2019-08-31 NOTE — Addendum Note (Signed)
Addended by: Antonieta Iba on: 08/31/2019 05:25 PM   Modules accepted: Orders

## 2019-08-31 NOTE — Telephone Encounter (Signed)
Spoke with the patient's daughter who states that the patient called her this morning saying that she was having chest pain. Chest pain did not resolve so the daughter called 911 and met EMS at patient's home. EKG was done which showed NSR, no ectopy, no elevations, depressions or T-wave inversions. Heart rate 70, BP 150/82, O2 98%. EMS recommended that she call her cardiologist. Patient is currently not having any chest pain. Denies any other symptoms.

## 2019-08-31 NOTE — Telephone Encounter (Signed)
Spoke with the patient's daughter and advised her on Dr. Theodosia Blender recommendation to go to the ER for further evaluation. The patient's daughter states that the patient has not had any further chest pain so will not be taking her to the ER at this point. She states that if chest pain returns then she will take her.

## 2019-08-31 NOTE — Telephone Encounter (Signed)
Fraser Din called stating they had to call 911 for patient his morning.  They came and checked her out they stated everything was fine, but that they should call her cardiologist and have blood work ordered to make sure it wasn't anything cardiac related.

## 2019-08-31 NOTE — Telephone Encounter (Signed)
How long did her CP last this am

## 2019-08-31 NOTE — Telephone Encounter (Signed)
Please have her come in tomorrow for a hs troponin and run is stat and then set her up for Lexiscan myoview this week with followup with PA after myoivew.

## 2019-08-31 NOTE — Telephone Encounter (Signed)
Would still send to ER

## 2019-08-31 NOTE — Telephone Encounter (Signed)
Spoke with patient's daughter. She will bring her in tomorrow for lab work. I advised that someone would be calling her tomorrow morning to schedule the stress test and then we would schedule a follow-up with a PA after that.

## 2019-08-31 NOTE — Addendum Note (Signed)
Addended by: Antonieta Iba on: 08/31/2019 05:28 PM   Modules accepted: Orders

## 2019-09-01 ENCOUNTER — Other Ambulatory Visit: Payer: Medicare Other

## 2019-09-01 ENCOUNTER — Other Ambulatory Visit: Payer: Self-pay

## 2019-09-01 DIAGNOSIS — R072 Precordial pain: Secondary | ICD-10-CM

## 2019-09-01 LAB — TROPONIN I (HIGH SENSITIVITY): Troponin I (High Sensitivity): 22 ng/L — ABNORMAL HIGH (ref <18)

## 2019-09-02 ENCOUNTER — Telehealth (HOSPITAL_COMMUNITY): Payer: Self-pay

## 2019-09-02 NOTE — Telephone Encounter (Signed)
Spoke with the patient, detailed instructions given. She stated that she understood and would be here for here test. Asked to call back with any questions. S.Takeela Peil EMTP

## 2019-09-03 ENCOUNTER — Ambulatory Visit (HOSPITAL_COMMUNITY): Payer: Medicare Other | Attending: Cardiology

## 2019-09-03 ENCOUNTER — Other Ambulatory Visit: Payer: Self-pay

## 2019-09-03 DIAGNOSIS — R072 Precordial pain: Secondary | ICD-10-CM | POA: Insufficient documentation

## 2019-09-03 MED ORDER — TECHNETIUM TC 99M TETROFOSMIN IV KIT
32.4000 | PACK | Freq: Once | INTRAVENOUS | Status: AC | PRN
  Administered 2019-09-03: 32.4 via INTRAVENOUS
  Filled 2019-09-03: qty 33

## 2019-09-03 MED ORDER — REGADENOSON 0.4 MG/5ML IV SOLN
0.4000 mg | Freq: Once | INTRAVENOUS | Status: AC
  Administered 2019-09-03: 09:00:00 0.4 mg via INTRAVENOUS

## 2019-09-03 MED ORDER — TECHNETIUM TC 99M TETROFOSMIN IV KIT
9.8000 | PACK | Freq: Once | INTRAVENOUS | Status: AC | PRN
  Administered 2019-09-03: 08:00:00 9.8 via INTRAVENOUS
  Filled 2019-09-03: qty 10

## 2019-09-04 LAB — MYOCARDIAL PERFUSION IMAGING
LV dias vol: 107 mL (ref 46–106)
LV sys vol: 57 mL
Peak HR: 85 {beats}/min
Rest HR: 67 {beats}/min
SDS: 0
SRS: 0
SSS: 0
TID: 0.93

## 2019-09-07 DIAGNOSIS — D649 Anemia, unspecified: Secondary | ICD-10-CM | POA: Diagnosis not present

## 2019-09-07 DIAGNOSIS — D518 Other vitamin B12 deficiency anemias: Secondary | ICD-10-CM | POA: Diagnosis not present

## 2019-09-07 DIAGNOSIS — N182 Chronic kidney disease, stage 2 (mild): Secondary | ICD-10-CM | POA: Diagnosis not present

## 2019-09-07 DIAGNOSIS — I10 Essential (primary) hypertension: Secondary | ICD-10-CM | POA: Diagnosis not present

## 2019-09-07 DIAGNOSIS — N183 Chronic kidney disease, stage 3 unspecified: Secondary | ICD-10-CM | POA: Diagnosis not present

## 2019-09-07 DIAGNOSIS — R809 Proteinuria, unspecified: Secondary | ICD-10-CM | POA: Diagnosis not present

## 2019-09-07 DIAGNOSIS — E559 Vitamin D deficiency, unspecified: Secondary | ICD-10-CM | POA: Diagnosis not present

## 2019-09-07 DIAGNOSIS — D511 Vitamin B12 deficiency anemia due to selective vitamin B12 malabsorption with proteinuria: Secondary | ICD-10-CM | POA: Diagnosis not present

## 2019-09-09 DIAGNOSIS — D631 Anemia in chronic kidney disease: Secondary | ICD-10-CM | POA: Diagnosis not present

## 2019-09-09 DIAGNOSIS — N183 Chronic kidney disease, stage 3 unspecified: Secondary | ICD-10-CM | POA: Diagnosis not present

## 2019-09-09 DIAGNOSIS — N390 Urinary tract infection, site not specified: Secondary | ICD-10-CM | POA: Diagnosis not present

## 2019-09-09 DIAGNOSIS — I10 Essential (primary) hypertension: Secondary | ICD-10-CM | POA: Diagnosis not present

## 2019-09-24 DIAGNOSIS — N189 Chronic kidney disease, unspecified: Secondary | ICD-10-CM | POA: Diagnosis not present

## 2019-09-24 DIAGNOSIS — D631 Anemia in chronic kidney disease: Secondary | ICD-10-CM | POA: Diagnosis not present

## 2019-10-01 DIAGNOSIS — N059 Unspecified nephritic syndrome with unspecified morphologic changes: Secondary | ICD-10-CM | POA: Diagnosis not present

## 2019-10-01 DIAGNOSIS — M810 Age-related osteoporosis without current pathological fracture: Secondary | ICD-10-CM | POA: Diagnosis not present

## 2019-10-01 DIAGNOSIS — E039 Hypothyroidism, unspecified: Secondary | ICD-10-CM | POA: Diagnosis not present

## 2019-10-01 DIAGNOSIS — N183 Chronic kidney disease, stage 3 unspecified: Secondary | ICD-10-CM | POA: Diagnosis not present

## 2019-10-01 DIAGNOSIS — I10 Essential (primary) hypertension: Secondary | ICD-10-CM | POA: Diagnosis not present

## 2019-10-01 DIAGNOSIS — N189 Chronic kidney disease, unspecified: Secondary | ICD-10-CM | POA: Diagnosis not present

## 2019-10-01 DIAGNOSIS — I2510 Atherosclerotic heart disease of native coronary artery without angina pectoris: Secondary | ICD-10-CM | POA: Diagnosis not present

## 2019-10-01 DIAGNOSIS — D638 Anemia in other chronic diseases classified elsewhere: Secondary | ICD-10-CM | POA: Diagnosis not present

## 2019-10-01 DIAGNOSIS — E7800 Pure hypercholesterolemia, unspecified: Secondary | ICD-10-CM | POA: Diagnosis not present

## 2019-10-01 DIAGNOSIS — D631 Anemia in chronic kidney disease: Secondary | ICD-10-CM | POA: Diagnosis not present

## 2019-10-01 DIAGNOSIS — I252 Old myocardial infarction: Secondary | ICD-10-CM | POA: Diagnosis not present

## 2019-10-01 DIAGNOSIS — I509 Heart failure, unspecified: Secondary | ICD-10-CM | POA: Diagnosis not present

## 2019-10-08 DIAGNOSIS — N189 Chronic kidney disease, unspecified: Secondary | ICD-10-CM | POA: Diagnosis not present

## 2019-10-08 DIAGNOSIS — D631 Anemia in chronic kidney disease: Secondary | ICD-10-CM | POA: Diagnosis not present

## 2019-10-13 ENCOUNTER — Other Ambulatory Visit: Payer: Self-pay | Admitting: Cardiology

## 2019-10-15 DIAGNOSIS — N189 Chronic kidney disease, unspecified: Secondary | ICD-10-CM | POA: Diagnosis not present

## 2019-10-15 DIAGNOSIS — D631 Anemia in chronic kidney disease: Secondary | ICD-10-CM | POA: Diagnosis not present

## 2019-10-22 DIAGNOSIS — D631 Anemia in chronic kidney disease: Secondary | ICD-10-CM | POA: Diagnosis not present

## 2019-10-22 DIAGNOSIS — N189 Chronic kidney disease, unspecified: Secondary | ICD-10-CM | POA: Diagnosis not present

## 2019-10-28 ENCOUNTER — Other Ambulatory Visit: Payer: Self-pay | Admitting: Cardiology

## 2019-10-29 DIAGNOSIS — N189 Chronic kidney disease, unspecified: Secondary | ICD-10-CM | POA: Diagnosis not present

## 2019-10-29 DIAGNOSIS — D631 Anemia in chronic kidney disease: Secondary | ICD-10-CM | POA: Diagnosis not present

## 2019-11-05 DIAGNOSIS — D631 Anemia in chronic kidney disease: Secondary | ICD-10-CM | POA: Diagnosis not present

## 2019-11-05 DIAGNOSIS — N189 Chronic kidney disease, unspecified: Secondary | ICD-10-CM | POA: Diagnosis not present

## 2019-11-12 DIAGNOSIS — N189 Chronic kidney disease, unspecified: Secondary | ICD-10-CM | POA: Diagnosis not present

## 2019-11-12 DIAGNOSIS — D631 Anemia in chronic kidney disease: Secondary | ICD-10-CM | POA: Diagnosis not present

## 2019-11-19 DIAGNOSIS — N189 Chronic kidney disease, unspecified: Secondary | ICD-10-CM | POA: Diagnosis not present

## 2019-11-19 DIAGNOSIS — D631 Anemia in chronic kidney disease: Secondary | ICD-10-CM | POA: Diagnosis not present

## 2019-11-26 DIAGNOSIS — D631 Anemia in chronic kidney disease: Secondary | ICD-10-CM | POA: Diagnosis not present

## 2019-11-26 DIAGNOSIS — N189 Chronic kidney disease, unspecified: Secondary | ICD-10-CM | POA: Diagnosis not present

## 2019-11-27 ENCOUNTER — Telehealth: Payer: Self-pay

## 2019-11-27 NOTE — Telephone Encounter (Signed)
Left message for patient to call back - needs to reschedule appointment with Dr. Radford Pax on Monday.

## 2019-11-30 ENCOUNTER — Ambulatory Visit: Payer: Medicare Other | Admitting: Cardiology

## 2019-12-07 DIAGNOSIS — E213 Hyperparathyroidism, unspecified: Secondary | ICD-10-CM | POA: Diagnosis not present

## 2019-12-07 DIAGNOSIS — N182 Chronic kidney disease, stage 2 (mild): Secondary | ICD-10-CM | POA: Diagnosis not present

## 2019-12-07 DIAGNOSIS — D518 Other vitamin B12 deficiency anemias: Secondary | ICD-10-CM | POA: Diagnosis not present

## 2019-12-07 DIAGNOSIS — D511 Vitamin B12 deficiency anemia due to selective vitamin B12 malabsorption with proteinuria: Secondary | ICD-10-CM | POA: Diagnosis not present

## 2019-12-07 DIAGNOSIS — D649 Anemia, unspecified: Secondary | ICD-10-CM | POA: Diagnosis not present

## 2019-12-07 DIAGNOSIS — I10 Essential (primary) hypertension: Secondary | ICD-10-CM | POA: Diagnosis not present

## 2019-12-07 DIAGNOSIS — D631 Anemia in chronic kidney disease: Secondary | ICD-10-CM | POA: Diagnosis not present

## 2019-12-07 DIAGNOSIS — E559 Vitamin D deficiency, unspecified: Secondary | ICD-10-CM | POA: Diagnosis not present

## 2019-12-10 DIAGNOSIS — N183 Chronic kidney disease, stage 3 unspecified: Secondary | ICD-10-CM | POA: Diagnosis not present

## 2019-12-10 DIAGNOSIS — N390 Urinary tract infection, site not specified: Secondary | ICD-10-CM | POA: Diagnosis not present

## 2019-12-10 DIAGNOSIS — I10 Essential (primary) hypertension: Secondary | ICD-10-CM | POA: Diagnosis not present

## 2019-12-10 DIAGNOSIS — D631 Anemia in chronic kidney disease: Secondary | ICD-10-CM | POA: Diagnosis not present

## 2019-12-16 ENCOUNTER — Telehealth: Payer: Self-pay | Admitting: Cardiology

## 2019-12-16 NOTE — Telephone Encounter (Signed)
Patient states she is returning Carly's call to let her know that she would not like to reschedule appointment currently scheduled for 01/07/20 with Dr. Radford Pax.

## 2019-12-28 DIAGNOSIS — N183 Chronic kidney disease, stage 3 unspecified: Secondary | ICD-10-CM | POA: Diagnosis not present

## 2019-12-28 DIAGNOSIS — E559 Vitamin D deficiency, unspecified: Secondary | ICD-10-CM | POA: Diagnosis not present

## 2019-12-28 DIAGNOSIS — E213 Hyperparathyroidism, unspecified: Secondary | ICD-10-CM | POA: Diagnosis not present

## 2019-12-28 DIAGNOSIS — I10 Essential (primary) hypertension: Secondary | ICD-10-CM | POA: Diagnosis not present

## 2019-12-28 DIAGNOSIS — D649 Anemia, unspecified: Secondary | ICD-10-CM | POA: Diagnosis not present

## 2019-12-28 DIAGNOSIS — D631 Anemia in chronic kidney disease: Secondary | ICD-10-CM | POA: Diagnosis not present

## 2019-12-28 DIAGNOSIS — D518 Other vitamin B12 deficiency anemias: Secondary | ICD-10-CM | POA: Diagnosis not present

## 2019-12-28 DIAGNOSIS — D511 Vitamin B12 deficiency anemia due to selective vitamin B12 malabsorption with proteinuria: Secondary | ICD-10-CM | POA: Diagnosis not present

## 2019-12-30 DIAGNOSIS — N390 Urinary tract infection, site not specified: Secondary | ICD-10-CM | POA: Diagnosis not present

## 2019-12-30 DIAGNOSIS — N184 Chronic kidney disease, stage 4 (severe): Secondary | ICD-10-CM | POA: Diagnosis not present

## 2019-12-30 DIAGNOSIS — I10 Essential (primary) hypertension: Secondary | ICD-10-CM | POA: Diagnosis not present

## 2019-12-31 ENCOUNTER — Telehealth: Payer: Self-pay | Admitting: Cardiology

## 2019-12-31 NOTE — Telephone Encounter (Signed)
Spoke with the patient and have rescheduled her for 9/02 at 1:40

## 2019-12-31 NOTE — Telephone Encounter (Signed)
Patient returning Wendy Walters's call in regards to rescheduling her appt for 1:40pm. Is she moving to 1:40 on 09/2 or on 09/1? Please advise.

## 2020-01-07 ENCOUNTER — Other Ambulatory Visit: Payer: Self-pay

## 2020-01-07 ENCOUNTER — Ambulatory Visit (INDEPENDENT_AMBULATORY_CARE_PROVIDER_SITE_OTHER): Payer: Medicare Other | Admitting: Cardiology

## 2020-01-07 ENCOUNTER — Ambulatory Visit: Payer: Medicare Other | Admitting: Cardiology

## 2020-01-07 ENCOUNTER — Encounter: Payer: Self-pay | Admitting: Cardiology

## 2020-01-07 VITALS — BP 162/85 | HR 84 | Ht 64.0 in | Wt 140.2 lb

## 2020-01-07 DIAGNOSIS — I2510 Atherosclerotic heart disease of native coronary artery without angina pectoris: Secondary | ICD-10-CM

## 2020-01-07 DIAGNOSIS — I350 Nonrheumatic aortic (valve) stenosis: Secondary | ICD-10-CM

## 2020-01-07 DIAGNOSIS — I5032 Chronic diastolic (congestive) heart failure: Secondary | ICD-10-CM | POA: Diagnosis not present

## 2020-01-07 DIAGNOSIS — N185 Chronic kidney disease, stage 5: Secondary | ICD-10-CM | POA: Diagnosis not present

## 2020-01-07 DIAGNOSIS — I6523 Occlusion and stenosis of bilateral carotid arteries: Secondary | ICD-10-CM | POA: Diagnosis not present

## 2020-01-07 DIAGNOSIS — I10 Essential (primary) hypertension: Secondary | ICD-10-CM

## 2020-01-07 DIAGNOSIS — E7800 Pure hypercholesterolemia, unspecified: Secondary | ICD-10-CM | POA: Diagnosis not present

## 2020-01-07 DIAGNOSIS — R600 Localized edema: Secondary | ICD-10-CM

## 2020-01-07 NOTE — Progress Notes (Signed)
Date:  06/11/9561   ID:  Wendy Walters, DOB [birth year], MRN [id number]   PCP:  Leighton Ruff, MD  Cardiologist:  Fransico Him, MD  Electrophysiologist:  None   Chief Complaint:  CAD, HTN, HLD  History of Present Illness:     Wendy Walters a 8T.o.femalewith a hx of ASCAD s/p PCI of LAD 2001 in setting of AWMI and cath 2005 with patent stent to the LAD, dyslipidemia, carotid artery stenosis, chronic diastolic CHF and HTN.  2D echo showed low normal LVF with EF 01-60% with diastolic dysfunction.  She has CKD and is followed by nephrology and tells me she will be on HD within the next 6 weeks.  She is waiting to hear from vascular surgery regarding getting an AVF placed.   She is here today for followup and is doing well. She has chronic DOE that has been present since her last hospitalization earlier this year with volume overload.  She has chronic LE edema which comes and goes and is stable.  She denies any chest pain or pressure, PND, orthopnea, dizziness, palpitations or syncope. She is compliant with he meds and is tolerating meds with no SE.    Prior CV studies:   The following studies were reviewed today:  Labs  [facility] 08/2019 Study Highlights    The left ventricular ejection fraction is mildly decreased (45-54%).  Nuclear stress EF: 47%.  There was no ST segment deviation noted during stress.  The study is normal.  This is a low risk study.   Normal pharmacologic nuclear stress test with no evidence for prior infarct or ischemia. Mildly decreased LVEF, correlation with an echocardiogram is recommended.  2D echo 04/2020 IMPRESSIONS    1. Left ventricular ejection fraction, by visual estimation, is 50 to  55%. The left ventricle has hyperdynamic function. There is no left  ventricular hypertrophy.  2. Left ventricular diastolic parameters are consistent with Grade I  diastolic dysfunction (impaired relaxation).  3. The left ventricle has  no regional wall motion abnormalities.  4. Global right ventricle has normal systolic function.The right  ventricular size is normal. No increase in right ventricular wall  thickness.  5. Left atrial size was mildly dilated.  6. Right atrial size was normal.  7. Large pleural effusion in the left lateral region.  8. Small pericardial effusion.  9. The pericardial effusion is circumferential.  10. Moderate mitral annular calcification.  11. The mitral valve is abnormal. Mild mitral valve regurgitation.  12. The tricuspid valve is grossly normal. Tricuspid valve regurgitation  is trivial.  13. Aortic valve area, by VTI measures 1.47 cm.  14. Aortic valve mean gradient measures 11.0 mmHg.  15. Aortic valve peak gradient measures 19.7 mmHg.  16. The aortic valve is tricuspid. Aortic valve regurgitation is not  visualized. Mild to moderate aortic valve stenosis.  17. The pulmonic valve was grossly normal. Pulmonic valve regurgitation is  not visualized.  18. Moderately elevated pulmonary artery systolic pressure.  19. The inferior vena cava is normal in size with <50% respiratory  variability, suggesting right atrial pressure of 8 mmHg.  20. Evidence of atrial level shunting detected by color flow Doppler.  21. Small patent foramen ovale with predominantly right to left shunting  across the atrial septum.     Past Medical History:  Diagnosis Date  . Anemia   . Anemia of chronic disease 02/04/2017  . Bradycardia 08/22/2015  . Carotid stenosis    1-39% stenosis  . Chronic  kidney disease    stage 3  . Coronary artery disease    s/p PCI 2001 of lad in setting of AWMI-rotational atherectomy with BMS -Dr Radford Pax  . Edema extremities 08/21/2016  . Fibrillary glomerulonephritis    w nephrotic range protenuria, following w neurology, managed with an ACE-I and ARB  . H/O Doppler ultrasound 01/2003   ,39% bilateral carotid stenosis.  . Hyperlipidemia   . Hypertension   .  Hypothyroidism   . Osteoporosis    s/p actonel for about 7-8 years   Past Surgical History:  Procedure Laterality Date  . ANGIOPLASTY  2001  . CARDIAC CATHETERIZATION  2005   patent LAD stent  . CHOLECYSTECTOMY N/A 02/07/2017   Procedure: LAPAROSCOPIC CHOLECYSTECTOMY WITH INTRAOPERATIVE CHOLANGIOGRAM;  Surgeon: Armandina Gemma, MD;  Location: [facility];  Service: General;  Laterality: N/A;  . EYE SURGERY Bilateral 2012   Cataract  . TONSILLECTOMY  1942     Current Meds  Medication Sig  . alendronate (FOSAMAX) 70 MG tablet Take 70 mg by mouth once a week. Take with a full glass of water on an empty stomach.  . Ascorbic Acid (VITAMIN C) 1000 MG tablet Take 1,000 mg by mouth daily.  Marland Kitchen aspirin 325 MG tablet Take 325 mg by mouth daily.  Marland Kitchen atorvastatin (LIPITOR) 80 MG tablet TAKE 1 TABLET DAILY AT 6 PM.  . B Complex Vitamins (VITAMIN B COMPLEX PO) Take 1 tablet by mouth daily.   . Coenzyme Q10 (COQ10) 200 MG CAPS Take 1 capsule by mouth daily.   Marland Kitchen ezetimibe (ZETIA) 10 MG tablet TAKE 1 TABLET EVERY DAY  . ferrous sulfate (GNP IRON) 325 (65 FE) MG tablet Take 325 mg by mouth daily with breakfast.  . hydrALAZINE (APRESOLINE) 25 MG tablet   . labetalol (NORMODYNE) 100 MG tablet Take 100 mg by mouth 3 (three) times daily.   Marland Kitchen levothyroxine (SYNTHROID, LEVOTHROID) 88 MCG tablet Take 88 mcg by mouth daily before breakfast.  . lisinopril (PRINIVIL,ZESTRIL) 40 MG tablet Take 1 tablet (40 mg total) by mouth 2 (two) times daily.  . metolazone (ZAROXOLYN) 2.5 MG tablet Take 2.5 mg by mouth. Take 2.5 mg every Monday, Wednesday, and Friday  . Multiple Vitamin (MULTIVITAMIN) tablet Take 1 tablet by mouth daily.  . niacin 500 MG tablet Take 500 mg by mouth 2 (two) times daily with a meal.  . Omega-3 Fatty Acids (OMEGA 3 PO) Take 1 capsule by mouth 2 (two) times daily.  . Probiotic Product (PROBIOTIC PO) Take 1 capsule by mouth every 3 (three) days.  . sodium bicarbonate 650 MG tablet Take 800 mg by mouth 3  (three) times daily.   Marland Kitchen torsemide (DEMADEX) 20 MG tablet Take 20 mg by mouth 2 (two) times daily.  Marland Kitchen triamcinolone cream (KENALOG) 0.1 % Apply 1 application topically 2 (two) times daily.  Marland Kitchen zinc gluconate 50 MG tablet Take 50 mg by mouth daily.     Allergies:   Codeine and Adhesive [tape]   Social History   Tobacco Use  . Smoking status: Former Smoker    Quit date: 06/08/1970    Years since quitting: 49.6  . Smokeless tobacco: Never Used  . Tobacco comment: quit in 1972  Vaping Use  . Vaping Use: Never used  Substance Use Topics  . Alcohol use: No    Comment: quit in her 80s  . Drug use: No     Family Hx: The patient's family history includes Cancer in her father; Cancer - Prostate  in her father; Diabetes in her daughter; Hypertension in her daughter and mother.  ROS:   Please see the history of present illness.     All other systems reviewed and are negative.   Labs/Other Tests and Data Reviewed:    Recent Labs: 04/22/2019: ALT 19; B Natriuretic Peptide 746.7; Magnesium 1.8 04/25/2019: BUN 43; Creatinine, Ser 2.73; Hemoglobin 7.9; Platelets 268; Potassium 3.4; Sodium 140   Recent Lipid Panel Lab Results  Component Value Date/Time   CHOL 127 05/18/2019 10:03 AM   TRIG 68 05/18/2019 10:03 AM   HDL 74 05/18/2019 10:03 AM   CHOLHDL 1.7 05/18/2019 10:03 AM   CHOLHDL 2.4 04/13/2016 09:35 AM   LDLCALC 39 05/18/2019 10:03 AM    Wt Readings from Last 3 Encounters:  01/07/20 140 lb 3.2 oz (63.6 kg)  09/03/19 140 lb (63.5 kg)  05/14/19 140 lb (63.5 kg)     Objective:    Vital Signs:  BP (!) 162/85   Ht 5\' 4"  (1.626 m)   Wt 140 lb 3.2 oz (63.6 kg)   BMI 24.07 kg/m    GEN: Well nourished, well developed in no acute distress HEENT: Normal NECK: No JVD; No carotid bruits LYMPHATICS: No lymphadenopathy CARDIAC:RRR, no  rubs, gallops.  2/6 SM at RUSB RESPIRATORY:  Clear to auscultation without rales, wheezing or rhonchi  ABDOMEN: Soft, non-tender,  non-distended MUSCULOSKELETAL:  No edema; No deformity  SKIN: Warm and dry NEUROLOGIC:  Alert and oriented x 3 PSYCHIATRIC:  Normal affect    ASSESSMENT & PLAN:    1.  ASCAD -s/p PCI of LAD 2001 in setting of AWMI and cath 2005 with patent stent to the LAD. -she denies any anginal symptoms -nuclear stress test 08/2019 showed no ischemia -continue ASA and statin.  2.  HTN -BP is poorly controlled on exam today but she brought her BP readings from home and they range from 134-150/68-109mmHg>>suspect she has some white coat HTN -Labetalol 100mg  TID, Lisinopril 40mg  BID, Hydralazine 25mg  daily -her nephrologist is managing her HTN  3.  Bilateral carotid artery stenosis -dopplers 05/2018 showed 1-39% bilateral stenosis.  -repeat doppler 05/2020 -continue statin and ASA  4.  HLD -LDL goal < 70 -continue Atorvastatin 80mg  daily and Zetia 10mg  daily -LDL was 39 in Jan 2021  5.  Chronic LE edema -controlled on diuretics -continue Torsemide 20mg  BID and Metolazone 2.5mg  M/W/F  6.  CKD stage 5 not on HD -followed by nephrologist in [place] -SCR up to 3.62 -she tells me she is waiting to hear about getting an AVF placed to start HD  7.  Chronic diastolic CHF -appears euvolemic on exam today -she has chronic SOB and LE edema which are  at baseline -seems back to baseline   8.  Aortic stenosis -mild to moderate by echo 04/2019 -repeat echo 04/2020   Medication Adjustments/Labs and Tests Ordered: Current medicines are reviewed at length with the patient today.  Concerns regarding medicines are outlined above.  Tests Ordered: No orders of the defined types were placed in this encounter.  Medication Changes: No orders of the defined types were placed in this encounter.   Disposition:  Follow up in 6 month(s)  Signed, Fransico Him, MD  01/07/2020 2:06 PM    [place]

## 2020-01-07 NOTE — Patient Instructions (Signed)
Medication Instructions:  Your physician recommends that you continue on your current medications as directed. Please refer to the Current Medication list given to you today.  *If you need a refill on your cardiac medications before your next appointment, please call your pharmacy*  Testing/Procedures: Your physician has requested that you have an echocardiogram in December 2021. Echocardiography is a painless test that uses sound waves to create images of your heart. It provides your doctor with information about the size and shape of your heart and how well your heart's chambers and valves are working. This procedure takes approximately one hour. There are no restrictions for this procedure.  Your physician has requested that you have a carotid duplex in January 2022. This test is an ultrasound of the carotid arteries in your neck. It looks at blood flow through these arteries that supply the brain with blood. Allow one hour for this exam. There are no restrictions or special instructions.   Follow-Up: At [facility], you and your health needs are our priority.  As part of our continuing mission to provide you with exceptional heart care, we have created designated Provider Care Teams.  These Care Teams include your primary Cardiologist (physician) and Advanced Practice Providers (APPs -  Physician Assistants and Nurse Practitioners) who all work together to provide you with the care you need, when you need it.  We recommend signing up for the patient portal called "MyChart".  Sign up information is provided on this After Visit Summary.  MyChart is used to connect with patients for Virtual Visits (Telemedicine).  Patients are able to view lab/test results, encounter notes, upcoming appointments, etc.  Non-urgent messages can be sent to your provider as well.   To learn more about what you can do with MyChart, go to [email or web address].    Your next appointment:   6 month(s)  The format  for your next appointment:   In Person  Provider:   You may see Fransico Him, MD or one of the following Advanced Practice Providers on your designated Care Team:    Melina Copa, PA-C  Ermalinda Barrios, PA-C

## 2020-01-07 NOTE — Addendum Note (Signed)
Addended by: Antonieta Iba on: 01/07/2020 02:09 PM   Modules accepted: Orders

## 2020-01-29 DIAGNOSIS — N185 Chronic kidney disease, stage 5: Secondary | ICD-10-CM | POA: Diagnosis not present

## 2020-01-29 DIAGNOSIS — D649 Anemia, unspecified: Secondary | ICD-10-CM | POA: Diagnosis not present

## 2020-01-29 DIAGNOSIS — I6529 Occlusion and stenosis of unspecified carotid artery: Secondary | ICD-10-CM | POA: Diagnosis not present

## 2020-01-29 DIAGNOSIS — S199XXA Unspecified injury of neck, initial encounter: Secondary | ICD-10-CM | POA: Diagnosis not present

## 2020-01-29 DIAGNOSIS — I10 Essential (primary) hypertension: Secondary | ICD-10-CM | POA: Diagnosis not present

## 2020-01-29 DIAGNOSIS — S72002A Fracture of unspecified part of neck of left femur, initial encounter for closed fracture: Secondary | ICD-10-CM | POA: Diagnosis not present

## 2020-01-29 DIAGNOSIS — R52 Pain, unspecified: Secondary | ICD-10-CM | POA: Diagnosis not present

## 2020-01-29 DIAGNOSIS — G9389 Other specified disorders of brain: Secondary | ICD-10-CM | POA: Diagnosis not present

## 2020-01-29 DIAGNOSIS — S72012A Unspecified intracapsular fracture of left femur, initial encounter for closed fracture: Secondary | ICD-10-CM | POA: Diagnosis not present

## 2020-01-29 DIAGNOSIS — N83202 Unspecified ovarian cyst, left side: Secondary | ICD-10-CM | POA: Diagnosis not present

## 2020-01-29 DIAGNOSIS — M4802 Spinal stenosis, cervical region: Secondary | ICD-10-CM | POA: Diagnosis not present

## 2020-01-29 DIAGNOSIS — Z20822 Contact with and (suspected) exposure to covid-19: Secondary | ICD-10-CM | POA: Diagnosis not present

## 2020-01-29 DIAGNOSIS — S0990XA Unspecified injury of head, initial encounter: Secondary | ICD-10-CM | POA: Diagnosis not present

## 2020-01-29 DIAGNOSIS — R0902 Hypoxemia: Secondary | ICD-10-CM | POA: Diagnosis not present

## 2020-01-29 DIAGNOSIS — S3993XA Unspecified injury of pelvis, initial encounter: Secondary | ICD-10-CM | POA: Diagnosis not present

## 2020-01-29 DIAGNOSIS — W1839XA Other fall on same level, initial encounter: Secondary | ICD-10-CM | POA: Diagnosis not present

## 2020-01-29 DIAGNOSIS — N179 Acute kidney failure, unspecified: Secondary | ICD-10-CM | POA: Diagnosis not present

## 2020-01-29 DIAGNOSIS — Y998 Other external cause status: Secondary | ICD-10-CM | POA: Diagnosis not present

## 2020-01-29 DIAGNOSIS — M47812 Spondylosis without myelopathy or radiculopathy, cervical region: Secondary | ICD-10-CM | POA: Diagnosis not present

## 2020-01-29 DIAGNOSIS — Z01818 Encounter for other preprocedural examination: Secondary | ICD-10-CM | POA: Diagnosis not present

## 2020-01-29 DIAGNOSIS — W19XXXA Unspecified fall, initial encounter: Secondary | ICD-10-CM | POA: Diagnosis not present

## 2020-01-30 DIAGNOSIS — N179 Acute kidney failure, unspecified: Secondary | ICD-10-CM | POA: Diagnosis not present

## 2020-01-30 DIAGNOSIS — Z66 Do not resuscitate: Secondary | ICD-10-CM | POA: Diagnosis not present

## 2020-01-30 DIAGNOSIS — I132 Hypertensive heart and chronic kidney disease with heart failure and with stage 5 chronic kidney disease, or end stage renal disease: Secondary | ICD-10-CM | POA: Diagnosis not present

## 2020-01-30 DIAGNOSIS — I5032 Chronic diastolic (congestive) heart failure: Secondary | ICD-10-CM | POA: Diagnosis not present

## 2020-01-30 DIAGNOSIS — S72002A Fracture of unspecified part of neck of left femur, initial encounter for closed fracture: Secondary | ICD-10-CM | POA: Diagnosis not present

## 2020-01-30 DIAGNOSIS — D649 Anemia, unspecified: Secondary | ICD-10-CM | POA: Diagnosis not present

## 2020-01-30 DIAGNOSIS — D631 Anemia in chronic kidney disease: Secondary | ICD-10-CM | POA: Diagnosis not present

## 2020-01-30 DIAGNOSIS — E876 Hypokalemia: Secondary | ICD-10-CM | POA: Diagnosis not present

## 2020-01-30 DIAGNOSIS — S72012S Unspecified intracapsular fracture of left femur, sequela: Secondary | ICD-10-CM | POA: Diagnosis not present

## 2020-01-30 DIAGNOSIS — S72002S Fracture of unspecified part of neck of left femur, sequela: Secondary | ICD-10-CM | POA: Diagnosis not present

## 2020-01-30 DIAGNOSIS — I10 Essential (primary) hypertension: Secondary | ICD-10-CM | POA: Diagnosis not present

## 2020-01-30 DIAGNOSIS — E872 Acidosis: Secondary | ICD-10-CM | POA: Diagnosis not present

## 2020-01-30 DIAGNOSIS — N184 Chronic kidney disease, stage 4 (severe): Secondary | ICD-10-CM | POA: Diagnosis not present

## 2020-01-30 DIAGNOSIS — E785 Hyperlipidemia, unspecified: Secondary | ICD-10-CM | POA: Diagnosis not present

## 2020-01-30 DIAGNOSIS — N185 Chronic kidney disease, stage 5: Secondary | ICD-10-CM | POA: Diagnosis not present

## 2020-01-30 DIAGNOSIS — D6489 Other specified anemias: Secondary | ICD-10-CM | POA: Diagnosis not present

## 2020-01-30 DIAGNOSIS — I252 Old myocardial infarction: Secondary | ICD-10-CM | POA: Diagnosis not present

## 2020-01-31 DIAGNOSIS — I132 Hypertensive heart and chronic kidney disease with heart failure and with stage 5 chronic kidney disease, or end stage renal disease: Secondary | ICD-10-CM | POA: Diagnosis not present

## 2020-01-31 DIAGNOSIS — I252 Old myocardial infarction: Secondary | ICD-10-CM | POA: Diagnosis not present

## 2020-01-31 DIAGNOSIS — I5032 Chronic diastolic (congestive) heart failure: Secondary | ICD-10-CM | POA: Diagnosis not present

## 2020-01-31 DIAGNOSIS — S72012S Unspecified intracapsular fracture of left femur, sequela: Secondary | ICD-10-CM | POA: Diagnosis not present

## 2020-01-31 DIAGNOSIS — D631 Anemia in chronic kidney disease: Secondary | ICD-10-CM | POA: Diagnosis not present

## 2020-01-31 DIAGNOSIS — E876 Hypokalemia: Secondary | ICD-10-CM | POA: Diagnosis not present

## 2020-01-31 DIAGNOSIS — D649 Anemia, unspecified: Secondary | ICD-10-CM | POA: Diagnosis not present

## 2020-01-31 DIAGNOSIS — E872 Acidosis: Secondary | ICD-10-CM | POA: Diagnosis not present

## 2020-01-31 DIAGNOSIS — S72002S Fracture of unspecified part of neck of left femur, sequela: Secondary | ICD-10-CM | POA: Diagnosis not present

## 2020-01-31 DIAGNOSIS — N179 Acute kidney failure, unspecified: Secondary | ICD-10-CM | POA: Diagnosis not present

## 2020-01-31 DIAGNOSIS — I10 Essential (primary) hypertension: Secondary | ICD-10-CM | POA: Diagnosis not present

## 2020-01-31 DIAGNOSIS — N185 Chronic kidney disease, stage 5: Secondary | ICD-10-CM | POA: Diagnosis not present

## 2020-01-31 DIAGNOSIS — R9431 Abnormal electrocardiogram [ECG] [EKG]: Secondary | ICD-10-CM | POA: Diagnosis not present

## 2020-02-01 DIAGNOSIS — I10 Essential (primary) hypertension: Secondary | ICD-10-CM | POA: Diagnosis not present

## 2020-02-01 DIAGNOSIS — I5032 Chronic diastolic (congestive) heart failure: Secondary | ICD-10-CM | POA: Diagnosis not present

## 2020-02-01 DIAGNOSIS — D6489 Other specified anemias: Secondary | ICD-10-CM | POA: Diagnosis not present

## 2020-02-01 DIAGNOSIS — N184 Chronic kidney disease, stage 4 (severe): Secondary | ICD-10-CM | POA: Diagnosis not present

## 2020-02-01 DIAGNOSIS — N185 Chronic kidney disease, stage 5: Secondary | ICD-10-CM | POA: Diagnosis not present

## 2020-02-01 DIAGNOSIS — I252 Old myocardial infarction: Secondary | ICD-10-CM | POA: Diagnosis not present

## 2020-02-01 DIAGNOSIS — N186 End stage renal disease: Secondary | ICD-10-CM | POA: Diagnosis not present

## 2020-02-01 DIAGNOSIS — Z66 Do not resuscitate: Secondary | ICD-10-CM | POA: Diagnosis not present

## 2020-02-01 DIAGNOSIS — E876 Hypokalemia: Secondary | ICD-10-CM | POA: Diagnosis not present

## 2020-02-01 DIAGNOSIS — N179 Acute kidney failure, unspecified: Secondary | ICD-10-CM | POA: Diagnosis not present

## 2020-02-01 DIAGNOSIS — E785 Hyperlipidemia, unspecified: Secondary | ICD-10-CM | POA: Diagnosis not present

## 2020-02-01 DIAGNOSIS — D631 Anemia in chronic kidney disease: Secondary | ICD-10-CM | POA: Diagnosis not present

## 2020-02-01 DIAGNOSIS — S72012S Unspecified intracapsular fracture of left femur, sequela: Secondary | ICD-10-CM | POA: Diagnosis not present

## 2020-02-01 DIAGNOSIS — I132 Hypertensive heart and chronic kidney disease with heart failure and with stage 5 chronic kidney disease, or end stage renal disease: Secondary | ICD-10-CM | POA: Diagnosis not present

## 2020-02-02 DIAGNOSIS — N179 Acute kidney failure, unspecified: Secondary | ICD-10-CM | POA: Diagnosis not present

## 2020-02-02 DIAGNOSIS — D631 Anemia in chronic kidney disease: Secondary | ICD-10-CM | POA: Diagnosis not present

## 2020-02-02 DIAGNOSIS — S72012S Unspecified intracapsular fracture of left femur, sequela: Secondary | ICD-10-CM | POA: Diagnosis not present

## 2020-02-02 DIAGNOSIS — N185 Chronic kidney disease, stage 5: Secondary | ICD-10-CM | POA: Diagnosis not present

## 2020-02-02 DIAGNOSIS — I10 Essential (primary) hypertension: Secondary | ICD-10-CM | POA: Diagnosis not present

## 2020-02-02 DIAGNOSIS — I5032 Chronic diastolic (congestive) heart failure: Secondary | ICD-10-CM | POA: Diagnosis not present

## 2020-02-02 DIAGNOSIS — N186 End stage renal disease: Secondary | ICD-10-CM | POA: Diagnosis not present

## 2020-02-02 DIAGNOSIS — I132 Hypertensive heart and chronic kidney disease with heart failure and with stage 5 chronic kidney disease, or end stage renal disease: Secondary | ICD-10-CM | POA: Diagnosis not present

## 2020-02-03 DIAGNOSIS — N185 Chronic kidney disease, stage 5: Secondary | ICD-10-CM | POA: Diagnosis not present

## 2020-02-03 DIAGNOSIS — S72012S Unspecified intracapsular fracture of left femur, sequela: Secondary | ICD-10-CM | POA: Diagnosis not present

## 2020-02-03 DIAGNOSIS — Z9181 History of falling: Secondary | ICD-10-CM | POA: Diagnosis not present

## 2020-02-03 DIAGNOSIS — W19XXXA Unspecified fall, initial encounter: Secondary | ICD-10-CM | POA: Diagnosis not present

## 2020-02-03 DIAGNOSIS — I959 Hypotension, unspecified: Secondary | ICD-10-CM | POA: Diagnosis not present

## 2020-02-03 DIAGNOSIS — E039 Hypothyroidism, unspecified: Secondary | ICD-10-CM | POA: Diagnosis not present

## 2020-02-03 DIAGNOSIS — R5381 Other malaise: Secondary | ICD-10-CM | POA: Diagnosis not present

## 2020-02-03 DIAGNOSIS — I10 Essential (primary) hypertension: Secondary | ICD-10-CM | POA: Diagnosis not present

## 2020-02-03 DIAGNOSIS — E860 Dehydration: Secondary | ICD-10-CM | POA: Diagnosis not present

## 2020-02-03 DIAGNOSIS — M810 Age-related osteoporosis without current pathological fracture: Secondary | ICD-10-CM | POA: Diagnosis not present

## 2020-02-03 DIAGNOSIS — K219 Gastro-esophageal reflux disease without esophagitis: Secondary | ICD-10-CM | POA: Diagnosis not present

## 2020-02-03 DIAGNOSIS — D631 Anemia in chronic kidney disease: Secondary | ICD-10-CM | POA: Diagnosis not present

## 2020-02-03 DIAGNOSIS — Z471 Aftercare following joint replacement surgery: Secondary | ICD-10-CM | POA: Diagnosis not present

## 2020-02-03 DIAGNOSIS — Z96642 Presence of left artificial hip joint: Secondary | ICD-10-CM | POA: Diagnosis not present

## 2020-02-03 DIAGNOSIS — M2550 Pain in unspecified joint: Secondary | ICD-10-CM | POA: Diagnosis not present

## 2020-02-03 DIAGNOSIS — S72002D Fracture of unspecified part of neck of left femur, subsequent encounter for closed fracture with routine healing: Secondary | ICD-10-CM | POA: Diagnosis not present

## 2020-02-03 DIAGNOSIS — N186 End stage renal disease: Secondary | ICD-10-CM | POA: Diagnosis not present

## 2020-02-03 DIAGNOSIS — I1311 Hypertensive heart and chronic kidney disease without heart failure, with stage 5 chronic kidney disease, or end stage renal disease: Secondary | ICD-10-CM | POA: Diagnosis not present

## 2020-02-03 DIAGNOSIS — N9489 Other specified conditions associated with female genital organs and menstrual cycle: Secondary | ICD-10-CM | POA: Diagnosis not present

## 2020-02-03 DIAGNOSIS — E785 Hyperlipidemia, unspecified: Secondary | ICD-10-CM | POA: Diagnosis not present

## 2020-02-03 DIAGNOSIS — Z7401 Bed confinement status: Secondary | ICD-10-CM | POA: Diagnosis not present

## 2020-02-03 DIAGNOSIS — E559 Vitamin D deficiency, unspecified: Secondary | ICD-10-CM | POA: Diagnosis not present

## 2020-02-03 DIAGNOSIS — K5900 Constipation, unspecified: Secondary | ICD-10-CM | POA: Diagnosis not present

## 2020-02-03 DIAGNOSIS — N83209 Unspecified ovarian cyst, unspecified side: Secondary | ICD-10-CM | POA: Diagnosis not present

## 2020-02-04 DIAGNOSIS — N185 Chronic kidney disease, stage 5: Secondary | ICD-10-CM | POA: Diagnosis not present

## 2020-02-04 DIAGNOSIS — M810 Age-related osteoporosis without current pathological fracture: Secondary | ICD-10-CM | POA: Diagnosis not present

## 2020-02-04 DIAGNOSIS — S72002D Fracture of unspecified part of neck of left femur, subsequent encounter for closed fracture with routine healing: Secondary | ICD-10-CM | POA: Diagnosis not present

## 2020-02-04 DIAGNOSIS — K219 Gastro-esophageal reflux disease without esophagitis: Secondary | ICD-10-CM | POA: Diagnosis not present

## 2020-02-05 DIAGNOSIS — D631 Anemia in chronic kidney disease: Secondary | ICD-10-CM | POA: Diagnosis not present

## 2020-02-05 DIAGNOSIS — N186 End stage renal disease: Secondary | ICD-10-CM | POA: Diagnosis not present

## 2020-02-05 DIAGNOSIS — I10 Essential (primary) hypertension: Secondary | ICD-10-CM | POA: Diagnosis not present

## 2020-02-08 DIAGNOSIS — N185 Chronic kidney disease, stage 5: Secondary | ICD-10-CM | POA: Diagnosis not present

## 2020-02-08 DIAGNOSIS — I1311 Hypertensive heart and chronic kidney disease without heart failure, with stage 5 chronic kidney disease, or end stage renal disease: Secondary | ICD-10-CM | POA: Diagnosis not present

## 2020-02-09 DIAGNOSIS — M2550 Pain in unspecified joint: Secondary | ICD-10-CM | POA: Diagnosis not present

## 2020-02-09 DIAGNOSIS — Z7401 Bed confinement status: Secondary | ICD-10-CM | POA: Diagnosis not present

## 2020-02-09 DIAGNOSIS — R5381 Other malaise: Secondary | ICD-10-CM | POA: Diagnosis not present

## 2020-02-12 DIAGNOSIS — Z87891 Personal history of nicotine dependence: Secondary | ICD-10-CM | POA: Diagnosis not present

## 2020-02-12 DIAGNOSIS — Z4789 Encounter for other orthopedic aftercare: Secondary | ICD-10-CM | POA: Diagnosis not present

## 2020-02-12 DIAGNOSIS — W19XXXD Unspecified fall, subsequent encounter: Secondary | ICD-10-CM | POA: Diagnosis not present

## 2020-02-12 DIAGNOSIS — E785 Hyperlipidemia, unspecified: Secondary | ICD-10-CM | POA: Diagnosis not present

## 2020-02-12 DIAGNOSIS — I132 Hypertensive heart and chronic kidney disease with heart failure and with stage 5 chronic kidney disease, or end stage renal disease: Secondary | ICD-10-CM | POA: Diagnosis not present

## 2020-02-12 DIAGNOSIS — N186 End stage renal disease: Secondary | ICD-10-CM | POA: Diagnosis not present

## 2020-02-12 DIAGNOSIS — I2510 Atherosclerotic heart disease of native coronary artery without angina pectoris: Secondary | ICD-10-CM | POA: Diagnosis not present

## 2020-02-12 DIAGNOSIS — Z7982 Long term (current) use of aspirin: Secondary | ICD-10-CM | POA: Diagnosis not present

## 2020-02-12 DIAGNOSIS — Z992 Dependence on renal dialysis: Secondary | ICD-10-CM | POA: Diagnosis not present

## 2020-02-12 DIAGNOSIS — Z96642 Presence of left artificial hip joint: Secondary | ICD-10-CM | POA: Diagnosis not present

## 2020-02-12 DIAGNOSIS — D631 Anemia in chronic kidney disease: Secondary | ICD-10-CM | POA: Diagnosis not present

## 2020-02-12 DIAGNOSIS — I5033 Acute on chronic diastolic (congestive) heart failure: Secondary | ICD-10-CM | POA: Diagnosis not present

## 2020-02-12 DIAGNOSIS — S72002D Fracture of unspecified part of neck of left femur, subsequent encounter for closed fracture with routine healing: Secondary | ICD-10-CM | POA: Diagnosis not present

## 2020-02-15 DIAGNOSIS — J8410 Pulmonary fibrosis, unspecified: Secondary | ICD-10-CM | POA: Diagnosis not present

## 2020-02-15 DIAGNOSIS — Z20822 Contact with and (suspected) exposure to covid-19: Secondary | ICD-10-CM | POA: Diagnosis not present

## 2020-02-15 DIAGNOSIS — R609 Edema, unspecified: Secondary | ICD-10-CM | POA: Diagnosis not present

## 2020-02-15 DIAGNOSIS — I5033 Acute on chronic diastolic (congestive) heart failure: Secondary | ICD-10-CM | POA: Diagnosis not present

## 2020-02-15 DIAGNOSIS — N186 End stage renal disease: Secondary | ICD-10-CM | POA: Diagnosis not present

## 2020-02-15 DIAGNOSIS — Z66 Do not resuscitate: Secondary | ICD-10-CM | POA: Diagnosis not present

## 2020-02-15 DIAGNOSIS — R600 Localized edema: Secondary | ICD-10-CM | POA: Diagnosis not present

## 2020-02-15 DIAGNOSIS — I509 Heart failure, unspecified: Secondary | ICD-10-CM | POA: Diagnosis not present

## 2020-02-15 DIAGNOSIS — M7989 Other specified soft tissue disorders: Secondary | ICD-10-CM | POA: Diagnosis not present

## 2020-02-15 DIAGNOSIS — M25552 Pain in left hip: Secondary | ICD-10-CM | POA: Diagnosis not present

## 2020-02-15 DIAGNOSIS — I132 Hypertensive heart and chronic kidney disease with heart failure and with stage 5 chronic kidney disease, or end stage renal disease: Secondary | ICD-10-CM | POA: Diagnosis not present

## 2020-02-15 DIAGNOSIS — D631 Anemia in chronic kidney disease: Secondary | ICD-10-CM | POA: Diagnosis not present

## 2020-02-15 DIAGNOSIS — Z7409 Other reduced mobility: Secondary | ICD-10-CM | POA: Diagnosis not present

## 2020-02-16 DIAGNOSIS — E785 Hyperlipidemia, unspecified: Secondary | ICD-10-CM | POA: Diagnosis present

## 2020-02-16 DIAGNOSIS — I132 Hypertensive heart and chronic kidney disease with heart failure and with stage 5 chronic kidney disease, or end stage renal disease: Secondary | ICD-10-CM | POA: Diagnosis present

## 2020-02-16 DIAGNOSIS — I6529 Occlusion and stenosis of unspecified carotid artery: Secondary | ICD-10-CM | POA: Diagnosis present

## 2020-02-16 DIAGNOSIS — Z955 Presence of coronary angioplasty implant and graft: Secondary | ICD-10-CM | POA: Diagnosis not present

## 2020-02-16 DIAGNOSIS — Z66 Do not resuscitate: Secondary | ICD-10-CM | POA: Diagnosis present

## 2020-02-16 DIAGNOSIS — N179 Acute kidney failure, unspecified: Secondary | ICD-10-CM | POA: Diagnosis not present

## 2020-02-16 DIAGNOSIS — S72002D Fracture of unspecified part of neck of left femur, subsequent encounter for closed fracture with routine healing: Secondary | ICD-10-CM | POA: Diagnosis not present

## 2020-02-16 DIAGNOSIS — I2510 Atherosclerotic heart disease of native coronary artery without angina pectoris: Secondary | ICD-10-CM | POA: Diagnosis present

## 2020-02-16 DIAGNOSIS — Z7409 Other reduced mobility: Secondary | ICD-10-CM | POA: Diagnosis not present

## 2020-02-16 DIAGNOSIS — I491 Atrial premature depolarization: Secondary | ICD-10-CM | POA: Diagnosis not present

## 2020-02-16 DIAGNOSIS — I130 Hypertensive heart and chronic kidney disease with heart failure and stage 1 through stage 4 chronic kidney disease, or unspecified chronic kidney disease: Secondary | ICD-10-CM | POA: Diagnosis not present

## 2020-02-16 DIAGNOSIS — E039 Hypothyroidism, unspecified: Secondary | ICD-10-CM | POA: Diagnosis not present

## 2020-02-16 DIAGNOSIS — I5033 Acute on chronic diastolic (congestive) heart failure: Secondary | ICD-10-CM | POA: Diagnosis present

## 2020-02-16 DIAGNOSIS — I509 Heart failure, unspecified: Secondary | ICD-10-CM | POA: Diagnosis not present

## 2020-02-16 DIAGNOSIS — J8410 Pulmonary fibrosis, unspecified: Secondary | ICD-10-CM | POA: Diagnosis not present

## 2020-02-16 DIAGNOSIS — R1032 Left lower quadrant pain: Secondary | ICD-10-CM | POA: Diagnosis not present

## 2020-02-16 DIAGNOSIS — Z7983 Long term (current) use of bisphosphonates: Secondary | ICD-10-CM | POA: Diagnosis not present

## 2020-02-16 DIAGNOSIS — Z87891 Personal history of nicotine dependence: Secondary | ICD-10-CM | POA: Diagnosis not present

## 2020-02-16 DIAGNOSIS — M7989 Other specified soft tissue disorders: Secondary | ICD-10-CM | POA: Diagnosis not present

## 2020-02-16 DIAGNOSIS — Z20822 Contact with and (suspected) exposure to covid-19: Secondary | ICD-10-CM | POA: Diagnosis present

## 2020-02-16 DIAGNOSIS — R600 Localized edema: Secondary | ICD-10-CM | POA: Diagnosis not present

## 2020-02-16 DIAGNOSIS — Z7982 Long term (current) use of aspirin: Secondary | ICD-10-CM | POA: Diagnosis not present

## 2020-02-16 DIAGNOSIS — I082 Rheumatic disorders of both aortic and tricuspid valves: Secondary | ICD-10-CM | POA: Diagnosis not present

## 2020-02-16 DIAGNOSIS — M25552 Pain in left hip: Secondary | ICD-10-CM | POA: Diagnosis not present

## 2020-02-16 DIAGNOSIS — Z4901 Encounter for fitting and adjustment of extracorporeal dialysis catheter: Secondary | ICD-10-CM | POA: Diagnosis not present

## 2020-02-16 DIAGNOSIS — N184 Chronic kidney disease, stage 4 (severe): Secondary | ICD-10-CM | POA: Diagnosis not present

## 2020-02-16 DIAGNOSIS — I10 Essential (primary) hypertension: Secondary | ICD-10-CM | POA: Diagnosis not present

## 2020-02-16 DIAGNOSIS — D66 Hereditary factor VIII deficiency: Secondary | ICD-10-CM | POA: Diagnosis not present

## 2020-02-16 DIAGNOSIS — R1030 Lower abdominal pain, unspecified: Secondary | ICD-10-CM | POA: Diagnosis not present

## 2020-02-16 DIAGNOSIS — Z992 Dependence on renal dialysis: Secondary | ICD-10-CM | POA: Diagnosis not present

## 2020-02-16 DIAGNOSIS — D631 Anemia in chronic kidney disease: Secondary | ICD-10-CM | POA: Diagnosis present

## 2020-02-16 DIAGNOSIS — D649 Anemia, unspecified: Secondary | ICD-10-CM | POA: Diagnosis not present

## 2020-02-16 DIAGNOSIS — N186 End stage renal disease: Secondary | ICD-10-CM | POA: Diagnosis present

## 2020-02-23 DIAGNOSIS — D631 Anemia in chronic kidney disease: Secondary | ICD-10-CM | POA: Diagnosis not present

## 2020-02-23 DIAGNOSIS — N2581 Secondary hyperparathyroidism of renal origin: Secondary | ICD-10-CM | POA: Diagnosis not present

## 2020-02-23 DIAGNOSIS — D509 Iron deficiency anemia, unspecified: Secondary | ICD-10-CM | POA: Diagnosis not present

## 2020-02-23 DIAGNOSIS — N186 End stage renal disease: Secondary | ICD-10-CM | POA: Diagnosis not present

## 2020-02-23 DIAGNOSIS — Z114 Encounter for screening for human immunodeficiency virus [HIV]: Secondary | ICD-10-CM | POA: Diagnosis not present

## 2020-02-23 DIAGNOSIS — Z23 Encounter for immunization: Secondary | ICD-10-CM | POA: Diagnosis not present

## 2020-02-25 DIAGNOSIS — D509 Iron deficiency anemia, unspecified: Secondary | ICD-10-CM | POA: Diagnosis not present

## 2020-02-25 DIAGNOSIS — D631 Anemia in chronic kidney disease: Secondary | ICD-10-CM | POA: Diagnosis not present

## 2020-02-25 DIAGNOSIS — Z23 Encounter for immunization: Secondary | ICD-10-CM | POA: Diagnosis not present

## 2020-02-25 DIAGNOSIS — N186 End stage renal disease: Secondary | ICD-10-CM | POA: Diagnosis not present

## 2020-02-25 DIAGNOSIS — N2581 Secondary hyperparathyroidism of renal origin: Secondary | ICD-10-CM | POA: Diagnosis not present

## 2020-02-26 DIAGNOSIS — I132 Hypertensive heart and chronic kidney disease with heart failure and with stage 5 chronic kidney disease, or end stage renal disease: Secondary | ICD-10-CM | POA: Diagnosis not present

## 2020-02-26 DIAGNOSIS — N186 End stage renal disease: Secondary | ICD-10-CM | POA: Diagnosis not present

## 2020-02-26 DIAGNOSIS — S72002D Fracture of unspecified part of neck of left femur, subsequent encounter for closed fracture with routine healing: Secondary | ICD-10-CM | POA: Diagnosis not present

## 2020-02-26 DIAGNOSIS — I5033 Acute on chronic diastolic (congestive) heart failure: Secondary | ICD-10-CM | POA: Diagnosis not present

## 2020-02-26 DIAGNOSIS — Z992 Dependence on renal dialysis: Secondary | ICD-10-CM | POA: Diagnosis not present

## 2020-02-26 DIAGNOSIS — D631 Anemia in chronic kidney disease: Secondary | ICD-10-CM | POA: Diagnosis not present

## 2020-02-27 DIAGNOSIS — D631 Anemia in chronic kidney disease: Secondary | ICD-10-CM | POA: Diagnosis not present

## 2020-02-27 DIAGNOSIS — N2581 Secondary hyperparathyroidism of renal origin: Secondary | ICD-10-CM | POA: Diagnosis not present

## 2020-02-27 DIAGNOSIS — Z23 Encounter for immunization: Secondary | ICD-10-CM | POA: Diagnosis not present

## 2020-02-27 DIAGNOSIS — N186 End stage renal disease: Secondary | ICD-10-CM | POA: Diagnosis not present

## 2020-02-27 DIAGNOSIS — D509 Iron deficiency anemia, unspecified: Secondary | ICD-10-CM | POA: Diagnosis not present

## 2020-02-29 DIAGNOSIS — I5033 Acute on chronic diastolic (congestive) heart failure: Secondary | ICD-10-CM | POA: Diagnosis not present

## 2020-02-29 DIAGNOSIS — D631 Anemia in chronic kidney disease: Secondary | ICD-10-CM | POA: Diagnosis not present

## 2020-02-29 DIAGNOSIS — I132 Hypertensive heart and chronic kidney disease with heart failure and with stage 5 chronic kidney disease, or end stage renal disease: Secondary | ICD-10-CM | POA: Diagnosis not present

## 2020-02-29 DIAGNOSIS — S72002D Fracture of unspecified part of neck of left femur, subsequent encounter for closed fracture with routine healing: Secondary | ICD-10-CM | POA: Diagnosis not present

## 2020-02-29 DIAGNOSIS — N186 End stage renal disease: Secondary | ICD-10-CM | POA: Diagnosis not present

## 2020-02-29 DIAGNOSIS — Z992 Dependence on renal dialysis: Secondary | ICD-10-CM | POA: Diagnosis not present

## 2020-03-01 DIAGNOSIS — Z23 Encounter for immunization: Secondary | ICD-10-CM | POA: Diagnosis not present

## 2020-03-01 DIAGNOSIS — N186 End stage renal disease: Secondary | ICD-10-CM | POA: Diagnosis not present

## 2020-03-01 DIAGNOSIS — D509 Iron deficiency anemia, unspecified: Secondary | ICD-10-CM | POA: Diagnosis not present

## 2020-03-01 DIAGNOSIS — N2581 Secondary hyperparathyroidism of renal origin: Secondary | ICD-10-CM | POA: Diagnosis not present

## 2020-03-01 DIAGNOSIS — D631 Anemia in chronic kidney disease: Secondary | ICD-10-CM | POA: Diagnosis not present

## 2020-03-03 DIAGNOSIS — N2581 Secondary hyperparathyroidism of renal origin: Secondary | ICD-10-CM | POA: Diagnosis not present

## 2020-03-03 DIAGNOSIS — N186 End stage renal disease: Secondary | ICD-10-CM | POA: Diagnosis not present

## 2020-03-03 DIAGNOSIS — D631 Anemia in chronic kidney disease: Secondary | ICD-10-CM | POA: Diagnosis not present

## 2020-03-03 DIAGNOSIS — D509 Iron deficiency anemia, unspecified: Secondary | ICD-10-CM | POA: Diagnosis not present

## 2020-03-03 DIAGNOSIS — Z23 Encounter for immunization: Secondary | ICD-10-CM | POA: Diagnosis not present

## 2020-03-05 DIAGNOSIS — D631 Anemia in chronic kidney disease: Secondary | ICD-10-CM | POA: Diagnosis not present

## 2020-03-05 DIAGNOSIS — N2581 Secondary hyperparathyroidism of renal origin: Secondary | ICD-10-CM | POA: Diagnosis not present

## 2020-03-05 DIAGNOSIS — N186 End stage renal disease: Secondary | ICD-10-CM | POA: Diagnosis not present

## 2020-03-05 DIAGNOSIS — Z23 Encounter for immunization: Secondary | ICD-10-CM | POA: Diagnosis not present

## 2020-03-05 DIAGNOSIS — D509 Iron deficiency anemia, unspecified: Secondary | ICD-10-CM | POA: Diagnosis not present

## 2020-03-07 DIAGNOSIS — Z0000 Encounter for general adult medical examination without abnormal findings: Secondary | ICD-10-CM | POA: Diagnosis not present

## 2020-03-07 DIAGNOSIS — N183 Chronic kidney disease, stage 3 unspecified: Secondary | ICD-10-CM | POA: Diagnosis not present

## 2020-03-07 DIAGNOSIS — M810 Age-related osteoporosis without current pathological fracture: Secondary | ICD-10-CM | POA: Diagnosis not present

## 2020-03-07 DIAGNOSIS — Z992 Dependence on renal dialysis: Secondary | ICD-10-CM | POA: Diagnosis not present

## 2020-03-07 DIAGNOSIS — S72002D Fracture of unspecified part of neck of left femur, subsequent encounter for closed fracture with routine healing: Secondary | ICD-10-CM | POA: Diagnosis not present

## 2020-03-07 DIAGNOSIS — D638 Anemia in other chronic diseases classified elsewhere: Secondary | ICD-10-CM | POA: Diagnosis not present

## 2020-03-07 DIAGNOSIS — I10 Essential (primary) hypertension: Secondary | ICD-10-CM | POA: Diagnosis not present

## 2020-03-07 DIAGNOSIS — I132 Hypertensive heart and chronic kidney disease with heart failure and with stage 5 chronic kidney disease, or end stage renal disease: Secondary | ICD-10-CM | POA: Diagnosis not present

## 2020-03-07 DIAGNOSIS — I509 Heart failure, unspecified: Secondary | ICD-10-CM | POA: Diagnosis not present

## 2020-03-07 DIAGNOSIS — E559 Vitamin D deficiency, unspecified: Secondary | ICD-10-CM | POA: Diagnosis not present

## 2020-03-07 DIAGNOSIS — N059 Unspecified nephritic syndrome with unspecified morphologic changes: Secondary | ICD-10-CM | POA: Diagnosis not present

## 2020-03-07 DIAGNOSIS — I5033 Acute on chronic diastolic (congestive) heart failure: Secondary | ICD-10-CM | POA: Diagnosis not present

## 2020-03-07 DIAGNOSIS — I2510 Atherosclerotic heart disease of native coronary artery without angina pectoris: Secondary | ICD-10-CM | POA: Diagnosis not present

## 2020-03-07 DIAGNOSIS — D631 Anemia in chronic kidney disease: Secondary | ICD-10-CM | POA: Diagnosis not present

## 2020-03-07 DIAGNOSIS — N186 End stage renal disease: Secondary | ICD-10-CM | POA: Diagnosis not present

## 2020-03-07 DIAGNOSIS — E039 Hypothyroidism, unspecified: Secondary | ICD-10-CM | POA: Diagnosis not present

## 2020-03-07 DIAGNOSIS — E7800 Pure hypercholesterolemia, unspecified: Secondary | ICD-10-CM | POA: Diagnosis not present

## 2020-03-07 DIAGNOSIS — I6523 Occlusion and stenosis of bilateral carotid arteries: Secondary | ICD-10-CM | POA: Diagnosis not present

## 2020-03-08 DIAGNOSIS — T8249XA Other complication of vascular dialysis catheter, initial encounter: Secondary | ICD-10-CM | POA: Diagnosis not present

## 2020-03-08 DIAGNOSIS — D509 Iron deficiency anemia, unspecified: Secondary | ICD-10-CM | POA: Diagnosis not present

## 2020-03-08 DIAGNOSIS — N2581 Secondary hyperparathyroidism of renal origin: Secondary | ICD-10-CM | POA: Diagnosis not present

## 2020-03-08 DIAGNOSIS — N186 End stage renal disease: Secondary | ICD-10-CM | POA: Diagnosis not present

## 2020-03-08 DIAGNOSIS — D631 Anemia in chronic kidney disease: Secondary | ICD-10-CM | POA: Diagnosis not present

## 2020-03-10 DIAGNOSIS — D509 Iron deficiency anemia, unspecified: Secondary | ICD-10-CM | POA: Diagnosis not present

## 2020-03-10 DIAGNOSIS — N2581 Secondary hyperparathyroidism of renal origin: Secondary | ICD-10-CM | POA: Diagnosis not present

## 2020-03-10 DIAGNOSIS — N186 End stage renal disease: Secondary | ICD-10-CM | POA: Diagnosis not present

## 2020-03-10 DIAGNOSIS — D631 Anemia in chronic kidney disease: Secondary | ICD-10-CM | POA: Diagnosis not present

## 2020-03-10 DIAGNOSIS — T8249XA Other complication of vascular dialysis catheter, initial encounter: Secondary | ICD-10-CM | POA: Diagnosis not present

## 2020-03-11 DIAGNOSIS — D631 Anemia in chronic kidney disease: Secondary | ICD-10-CM | POA: Diagnosis not present

## 2020-03-11 DIAGNOSIS — N186 End stage renal disease: Secondary | ICD-10-CM | POA: Diagnosis not present

## 2020-03-11 DIAGNOSIS — I5033 Acute on chronic diastolic (congestive) heart failure: Secondary | ICD-10-CM | POA: Diagnosis not present

## 2020-03-11 DIAGNOSIS — I132 Hypertensive heart and chronic kidney disease with heart failure and with stage 5 chronic kidney disease, or end stage renal disease: Secondary | ICD-10-CM | POA: Diagnosis not present

## 2020-03-11 DIAGNOSIS — S72002D Fracture of unspecified part of neck of left femur, subsequent encounter for closed fracture with routine healing: Secondary | ICD-10-CM | POA: Diagnosis not present

## 2020-03-11 DIAGNOSIS — Z992 Dependence on renal dialysis: Secondary | ICD-10-CM | POA: Diagnosis not present

## 2020-03-12 DIAGNOSIS — N186 End stage renal disease: Secondary | ICD-10-CM | POA: Diagnosis not present

## 2020-03-12 DIAGNOSIS — N2581 Secondary hyperparathyroidism of renal origin: Secondary | ICD-10-CM | POA: Diagnosis not present

## 2020-03-12 DIAGNOSIS — D631 Anemia in chronic kidney disease: Secondary | ICD-10-CM | POA: Diagnosis not present

## 2020-03-12 DIAGNOSIS — T8249XA Other complication of vascular dialysis catheter, initial encounter: Secondary | ICD-10-CM | POA: Diagnosis not present

## 2020-03-12 DIAGNOSIS — D509 Iron deficiency anemia, unspecified: Secondary | ICD-10-CM | POA: Diagnosis not present

## 2020-03-13 DIAGNOSIS — I132 Hypertensive heart and chronic kidney disease with heart failure and with stage 5 chronic kidney disease, or end stage renal disease: Secondary | ICD-10-CM | POA: Diagnosis not present

## 2020-03-13 DIAGNOSIS — E785 Hyperlipidemia, unspecified: Secondary | ICD-10-CM | POA: Diagnosis not present

## 2020-03-13 DIAGNOSIS — S72002D Fracture of unspecified part of neck of left femur, subsequent encounter for closed fracture with routine healing: Secondary | ICD-10-CM | POA: Diagnosis not present

## 2020-03-13 DIAGNOSIS — Z96642 Presence of left artificial hip joint: Secondary | ICD-10-CM | POA: Diagnosis not present

## 2020-03-13 DIAGNOSIS — Z4789 Encounter for other orthopedic aftercare: Secondary | ICD-10-CM | POA: Diagnosis not present

## 2020-03-13 DIAGNOSIS — I2510 Atherosclerotic heart disease of native coronary artery without angina pectoris: Secondary | ICD-10-CM | POA: Diagnosis not present

## 2020-03-13 DIAGNOSIS — I5033 Acute on chronic diastolic (congestive) heart failure: Secondary | ICD-10-CM | POA: Diagnosis not present

## 2020-03-13 DIAGNOSIS — Z992 Dependence on renal dialysis: Secondary | ICD-10-CM | POA: Diagnosis not present

## 2020-03-13 DIAGNOSIS — N186 End stage renal disease: Secondary | ICD-10-CM | POA: Diagnosis not present

## 2020-03-13 DIAGNOSIS — Z7982 Long term (current) use of aspirin: Secondary | ICD-10-CM | POA: Diagnosis not present

## 2020-03-13 DIAGNOSIS — Z9981 Dependence on supplemental oxygen: Secondary | ICD-10-CM | POA: Diagnosis not present

## 2020-03-13 DIAGNOSIS — Z87891 Personal history of nicotine dependence: Secondary | ICD-10-CM | POA: Diagnosis not present

## 2020-03-13 DIAGNOSIS — D631 Anemia in chronic kidney disease: Secondary | ICD-10-CM | POA: Diagnosis not present

## 2020-03-15 DIAGNOSIS — N186 End stage renal disease: Secondary | ICD-10-CM | POA: Diagnosis not present

## 2020-03-15 DIAGNOSIS — D509 Iron deficiency anemia, unspecified: Secondary | ICD-10-CM | POA: Diagnosis not present

## 2020-03-15 DIAGNOSIS — T8249XA Other complication of vascular dialysis catheter, initial encounter: Secondary | ICD-10-CM | POA: Diagnosis not present

## 2020-03-15 DIAGNOSIS — N2581 Secondary hyperparathyroidism of renal origin: Secondary | ICD-10-CM | POA: Diagnosis not present

## 2020-03-15 DIAGNOSIS — D631 Anemia in chronic kidney disease: Secondary | ICD-10-CM | POA: Diagnosis not present

## 2020-03-16 DIAGNOSIS — I132 Hypertensive heart and chronic kidney disease with heart failure and with stage 5 chronic kidney disease, or end stage renal disease: Secondary | ICD-10-CM | POA: Diagnosis not present

## 2020-03-16 DIAGNOSIS — D631 Anemia in chronic kidney disease: Secondary | ICD-10-CM | POA: Diagnosis not present

## 2020-03-16 DIAGNOSIS — N186 End stage renal disease: Secondary | ICD-10-CM | POA: Diagnosis not present

## 2020-03-16 DIAGNOSIS — I2510 Atherosclerotic heart disease of native coronary artery without angina pectoris: Secondary | ICD-10-CM | POA: Diagnosis not present

## 2020-03-16 DIAGNOSIS — I5033 Acute on chronic diastolic (congestive) heart failure: Secondary | ICD-10-CM | POA: Diagnosis not present

## 2020-03-16 DIAGNOSIS — S72002D Fracture of unspecified part of neck of left femur, subsequent encounter for closed fracture with routine healing: Secondary | ICD-10-CM | POA: Diagnosis not present

## 2020-03-17 DIAGNOSIS — D509 Iron deficiency anemia, unspecified: Secondary | ICD-10-CM | POA: Diagnosis not present

## 2020-03-17 DIAGNOSIS — D631 Anemia in chronic kidney disease: Secondary | ICD-10-CM | POA: Diagnosis not present

## 2020-03-17 DIAGNOSIS — N2581 Secondary hyperparathyroidism of renal origin: Secondary | ICD-10-CM | POA: Diagnosis not present

## 2020-03-17 DIAGNOSIS — T8249XA Other complication of vascular dialysis catheter, initial encounter: Secondary | ICD-10-CM | POA: Diagnosis not present

## 2020-03-17 DIAGNOSIS — N186 End stage renal disease: Secondary | ICD-10-CM | POA: Diagnosis not present

## 2020-03-18 DIAGNOSIS — N186 End stage renal disease: Secondary | ICD-10-CM | POA: Diagnosis not present

## 2020-03-18 DIAGNOSIS — D631 Anemia in chronic kidney disease: Secondary | ICD-10-CM | POA: Diagnosis not present

## 2020-03-18 DIAGNOSIS — S72002D Fracture of unspecified part of neck of left femur, subsequent encounter for closed fracture with routine healing: Secondary | ICD-10-CM | POA: Diagnosis not present

## 2020-03-18 DIAGNOSIS — I2510 Atherosclerotic heart disease of native coronary artery without angina pectoris: Secondary | ICD-10-CM | POA: Diagnosis not present

## 2020-03-18 DIAGNOSIS — I132 Hypertensive heart and chronic kidney disease with heart failure and with stage 5 chronic kidney disease, or end stage renal disease: Secondary | ICD-10-CM | POA: Diagnosis not present

## 2020-03-18 DIAGNOSIS — I5033 Acute on chronic diastolic (congestive) heart failure: Secondary | ICD-10-CM | POA: Diagnosis not present

## 2020-03-19 DIAGNOSIS — N2581 Secondary hyperparathyroidism of renal origin: Secondary | ICD-10-CM | POA: Diagnosis not present

## 2020-03-19 DIAGNOSIS — N186 End stage renal disease: Secondary | ICD-10-CM | POA: Diagnosis not present

## 2020-03-19 DIAGNOSIS — D631 Anemia in chronic kidney disease: Secondary | ICD-10-CM | POA: Diagnosis not present

## 2020-03-19 DIAGNOSIS — D509 Iron deficiency anemia, unspecified: Secondary | ICD-10-CM | POA: Diagnosis not present

## 2020-03-19 DIAGNOSIS — T8249XA Other complication of vascular dialysis catheter, initial encounter: Secondary | ICD-10-CM | POA: Diagnosis not present

## 2020-03-22 DIAGNOSIS — D509 Iron deficiency anemia, unspecified: Secondary | ICD-10-CM | POA: Diagnosis not present

## 2020-03-22 DIAGNOSIS — T8249XA Other complication of vascular dialysis catheter, initial encounter: Secondary | ICD-10-CM | POA: Diagnosis not present

## 2020-03-22 DIAGNOSIS — N2581 Secondary hyperparathyroidism of renal origin: Secondary | ICD-10-CM | POA: Diagnosis not present

## 2020-03-22 DIAGNOSIS — D631 Anemia in chronic kidney disease: Secondary | ICD-10-CM | POA: Diagnosis not present

## 2020-03-22 DIAGNOSIS — N186 End stage renal disease: Secondary | ICD-10-CM | POA: Diagnosis not present

## 2020-03-23 DIAGNOSIS — I132 Hypertensive heart and chronic kidney disease with heart failure and with stage 5 chronic kidney disease, or end stage renal disease: Secondary | ICD-10-CM | POA: Diagnosis not present

## 2020-03-23 DIAGNOSIS — I2510 Atherosclerotic heart disease of native coronary artery without angina pectoris: Secondary | ICD-10-CM | POA: Diagnosis not present

## 2020-03-23 DIAGNOSIS — N186 End stage renal disease: Secondary | ICD-10-CM | POA: Diagnosis not present

## 2020-03-23 DIAGNOSIS — D631 Anemia in chronic kidney disease: Secondary | ICD-10-CM | POA: Diagnosis not present

## 2020-03-23 DIAGNOSIS — S72002D Fracture of unspecified part of neck of left femur, subsequent encounter for closed fracture with routine healing: Secondary | ICD-10-CM | POA: Diagnosis not present

## 2020-03-23 DIAGNOSIS — I5033 Acute on chronic diastolic (congestive) heart failure: Secondary | ICD-10-CM | POA: Diagnosis not present

## 2020-03-24 DIAGNOSIS — D631 Anemia in chronic kidney disease: Secondary | ICD-10-CM | POA: Diagnosis not present

## 2020-03-24 DIAGNOSIS — N186 End stage renal disease: Secondary | ICD-10-CM | POA: Diagnosis not present

## 2020-03-24 DIAGNOSIS — D509 Iron deficiency anemia, unspecified: Secondary | ICD-10-CM | POA: Diagnosis not present

## 2020-03-24 DIAGNOSIS — T8249XA Other complication of vascular dialysis catheter, initial encounter: Secondary | ICD-10-CM | POA: Diagnosis not present

## 2020-03-24 DIAGNOSIS — N2581 Secondary hyperparathyroidism of renal origin: Secondary | ICD-10-CM | POA: Diagnosis not present

## 2020-03-25 DIAGNOSIS — I2510 Atherosclerotic heart disease of native coronary artery without angina pectoris: Secondary | ICD-10-CM | POA: Diagnosis not present

## 2020-03-25 DIAGNOSIS — D631 Anemia in chronic kidney disease: Secondary | ICD-10-CM | POA: Diagnosis not present

## 2020-03-25 DIAGNOSIS — S72002D Fracture of unspecified part of neck of left femur, subsequent encounter for closed fracture with routine healing: Secondary | ICD-10-CM | POA: Diagnosis not present

## 2020-03-25 DIAGNOSIS — I132 Hypertensive heart and chronic kidney disease with heart failure and with stage 5 chronic kidney disease, or end stage renal disease: Secondary | ICD-10-CM | POA: Diagnosis not present

## 2020-03-25 DIAGNOSIS — I5033 Acute on chronic diastolic (congestive) heart failure: Secondary | ICD-10-CM | POA: Diagnosis not present

## 2020-03-25 DIAGNOSIS — N186 End stage renal disease: Secondary | ICD-10-CM | POA: Diagnosis not present

## 2020-03-26 DIAGNOSIS — N2581 Secondary hyperparathyroidism of renal origin: Secondary | ICD-10-CM | POA: Diagnosis not present

## 2020-03-26 DIAGNOSIS — N186 End stage renal disease: Secondary | ICD-10-CM | POA: Diagnosis not present

## 2020-03-26 DIAGNOSIS — D509 Iron deficiency anemia, unspecified: Secondary | ICD-10-CM | POA: Diagnosis not present

## 2020-03-26 DIAGNOSIS — D631 Anemia in chronic kidney disease: Secondary | ICD-10-CM | POA: Diagnosis not present

## 2020-03-26 DIAGNOSIS — T8249XA Other complication of vascular dialysis catheter, initial encounter: Secondary | ICD-10-CM | POA: Diagnosis not present

## 2020-03-28 DIAGNOSIS — D631 Anemia in chronic kidney disease: Secondary | ICD-10-CM | POA: Diagnosis not present

## 2020-03-28 DIAGNOSIS — N186 End stage renal disease: Secondary | ICD-10-CM | POA: Diagnosis not present

## 2020-03-28 DIAGNOSIS — T8249XA Other complication of vascular dialysis catheter, initial encounter: Secondary | ICD-10-CM | POA: Diagnosis not present

## 2020-03-28 DIAGNOSIS — N2581 Secondary hyperparathyroidism of renal origin: Secondary | ICD-10-CM | POA: Diagnosis not present

## 2020-03-28 DIAGNOSIS — D509 Iron deficiency anemia, unspecified: Secondary | ICD-10-CM | POA: Diagnosis not present

## 2020-03-29 DIAGNOSIS — Z23 Encounter for immunization: Secondary | ICD-10-CM | POA: Diagnosis not present

## 2020-03-29 DIAGNOSIS — I2510 Atherosclerotic heart disease of native coronary artery without angina pectoris: Secondary | ICD-10-CM | POA: Diagnosis not present

## 2020-03-29 DIAGNOSIS — S72002D Fracture of unspecified part of neck of left femur, subsequent encounter for closed fracture with routine healing: Secondary | ICD-10-CM | POA: Diagnosis not present

## 2020-03-29 DIAGNOSIS — D631 Anemia in chronic kidney disease: Secondary | ICD-10-CM | POA: Diagnosis not present

## 2020-03-29 DIAGNOSIS — N186 End stage renal disease: Secondary | ICD-10-CM | POA: Diagnosis not present

## 2020-03-29 DIAGNOSIS — I5033 Acute on chronic diastolic (congestive) heart failure: Secondary | ICD-10-CM | POA: Diagnosis not present

## 2020-03-29 DIAGNOSIS — I132 Hypertensive heart and chronic kidney disease with heart failure and with stage 5 chronic kidney disease, or end stage renal disease: Secondary | ICD-10-CM | POA: Diagnosis not present

## 2020-03-30 DIAGNOSIS — D631 Anemia in chronic kidney disease: Secondary | ICD-10-CM | POA: Diagnosis not present

## 2020-03-30 DIAGNOSIS — D509 Iron deficiency anemia, unspecified: Secondary | ICD-10-CM | POA: Diagnosis not present

## 2020-03-30 DIAGNOSIS — T8249XA Other complication of vascular dialysis catheter, initial encounter: Secondary | ICD-10-CM | POA: Diagnosis not present

## 2020-03-30 DIAGNOSIS — N186 End stage renal disease: Secondary | ICD-10-CM | POA: Diagnosis not present

## 2020-03-30 DIAGNOSIS — N2581 Secondary hyperparathyroidism of renal origin: Secondary | ICD-10-CM | POA: Diagnosis not present

## 2020-04-01 DIAGNOSIS — I5033 Acute on chronic diastolic (congestive) heart failure: Secondary | ICD-10-CM | POA: Diagnosis not present

## 2020-04-01 DIAGNOSIS — N186 End stage renal disease: Secondary | ICD-10-CM | POA: Diagnosis not present

## 2020-04-01 DIAGNOSIS — S72002D Fracture of unspecified part of neck of left femur, subsequent encounter for closed fracture with routine healing: Secondary | ICD-10-CM | POA: Diagnosis not present

## 2020-04-01 DIAGNOSIS — D631 Anemia in chronic kidney disease: Secondary | ICD-10-CM | POA: Diagnosis not present

## 2020-04-01 DIAGNOSIS — I132 Hypertensive heart and chronic kidney disease with heart failure and with stage 5 chronic kidney disease, or end stage renal disease: Secondary | ICD-10-CM | POA: Diagnosis not present

## 2020-04-01 DIAGNOSIS — I2510 Atherosclerotic heart disease of native coronary artery without angina pectoris: Secondary | ICD-10-CM | POA: Diagnosis not present

## 2020-04-02 DIAGNOSIS — N186 End stage renal disease: Secondary | ICD-10-CM | POA: Diagnosis not present

## 2020-04-02 DIAGNOSIS — D509 Iron deficiency anemia, unspecified: Secondary | ICD-10-CM | POA: Diagnosis not present

## 2020-04-02 DIAGNOSIS — T8249XA Other complication of vascular dialysis catheter, initial encounter: Secondary | ICD-10-CM | POA: Diagnosis not present

## 2020-04-02 DIAGNOSIS — D631 Anemia in chronic kidney disease: Secondary | ICD-10-CM | POA: Diagnosis not present

## 2020-04-02 DIAGNOSIS — N2581 Secondary hyperparathyroidism of renal origin: Secondary | ICD-10-CM | POA: Diagnosis not present

## 2020-04-05 DIAGNOSIS — D509 Iron deficiency anemia, unspecified: Secondary | ICD-10-CM | POA: Diagnosis not present

## 2020-04-05 DIAGNOSIS — N2581 Secondary hyperparathyroidism of renal origin: Secondary | ICD-10-CM | POA: Diagnosis not present

## 2020-04-05 DIAGNOSIS — N186 End stage renal disease: Secondary | ICD-10-CM | POA: Diagnosis not present

## 2020-04-05 DIAGNOSIS — D631 Anemia in chronic kidney disease: Secondary | ICD-10-CM | POA: Diagnosis not present

## 2020-04-05 DIAGNOSIS — T8249XA Other complication of vascular dialysis catheter, initial encounter: Secondary | ICD-10-CM | POA: Diagnosis not present

## 2020-04-06 DIAGNOSIS — S72002D Fracture of unspecified part of neck of left femur, subsequent encounter for closed fracture with routine healing: Secondary | ICD-10-CM | POA: Diagnosis not present

## 2020-04-06 DIAGNOSIS — I132 Hypertensive heart and chronic kidney disease with heart failure and with stage 5 chronic kidney disease, or end stage renal disease: Secondary | ICD-10-CM | POA: Diagnosis not present

## 2020-04-06 DIAGNOSIS — I2510 Atherosclerotic heart disease of native coronary artery without angina pectoris: Secondary | ICD-10-CM | POA: Diagnosis not present

## 2020-04-06 DIAGNOSIS — I5033 Acute on chronic diastolic (congestive) heart failure: Secondary | ICD-10-CM | POA: Diagnosis not present

## 2020-04-06 DIAGNOSIS — N186 End stage renal disease: Secondary | ICD-10-CM | POA: Diagnosis not present

## 2020-04-06 DIAGNOSIS — D631 Anemia in chronic kidney disease: Secondary | ICD-10-CM | POA: Diagnosis not present

## 2020-04-06 DIAGNOSIS — I10 Essential (primary) hypertension: Secondary | ICD-10-CM | POA: Diagnosis not present

## 2020-04-07 ENCOUNTER — Other Ambulatory Visit (HOSPITAL_COMMUNITY): Payer: Medicare Other

## 2020-04-07 DIAGNOSIS — N186 End stage renal disease: Secondary | ICD-10-CM | POA: Diagnosis not present

## 2020-04-07 DIAGNOSIS — D509 Iron deficiency anemia, unspecified: Secondary | ICD-10-CM | POA: Diagnosis not present

## 2020-04-07 DIAGNOSIS — Z23 Encounter for immunization: Secondary | ICD-10-CM | POA: Diagnosis not present

## 2020-04-07 DIAGNOSIS — D631 Anemia in chronic kidney disease: Secondary | ICD-10-CM | POA: Diagnosis not present

## 2020-04-07 DIAGNOSIS — N2581 Secondary hyperparathyroidism of renal origin: Secondary | ICD-10-CM | POA: Diagnosis not present

## 2020-04-08 ENCOUNTER — Other Ambulatory Visit: Payer: Self-pay | Admitting: Cardiology

## 2020-04-08 DIAGNOSIS — S72002D Fracture of unspecified part of neck of left femur, subsequent encounter for closed fracture with routine healing: Secondary | ICD-10-CM | POA: Diagnosis not present

## 2020-04-08 DIAGNOSIS — I132 Hypertensive heart and chronic kidney disease with heart failure and with stage 5 chronic kidney disease, or end stage renal disease: Secondary | ICD-10-CM | POA: Diagnosis not present

## 2020-04-08 DIAGNOSIS — I5033 Acute on chronic diastolic (congestive) heart failure: Secondary | ICD-10-CM | POA: Diagnosis not present

## 2020-04-08 DIAGNOSIS — N186 End stage renal disease: Secondary | ICD-10-CM | POA: Diagnosis not present

## 2020-04-08 DIAGNOSIS — I2510 Atherosclerotic heart disease of native coronary artery without angina pectoris: Secondary | ICD-10-CM | POA: Diagnosis not present

## 2020-04-08 DIAGNOSIS — D631 Anemia in chronic kidney disease: Secondary | ICD-10-CM | POA: Diagnosis not present

## 2020-04-09 DIAGNOSIS — N186 End stage renal disease: Secondary | ICD-10-CM | POA: Diagnosis not present

## 2020-04-09 DIAGNOSIS — Z23 Encounter for immunization: Secondary | ICD-10-CM | POA: Diagnosis not present

## 2020-04-09 DIAGNOSIS — D509 Iron deficiency anemia, unspecified: Secondary | ICD-10-CM | POA: Diagnosis not present

## 2020-04-09 DIAGNOSIS — D631 Anemia in chronic kidney disease: Secondary | ICD-10-CM | POA: Diagnosis not present

## 2020-04-09 DIAGNOSIS — N2581 Secondary hyperparathyroidism of renal origin: Secondary | ICD-10-CM | POA: Diagnosis not present

## 2020-04-10 DIAGNOSIS — S72002D Fracture of unspecified part of neck of left femur, subsequent encounter for closed fracture with routine healing: Secondary | ICD-10-CM | POA: Diagnosis not present

## 2020-04-10 DIAGNOSIS — I5033 Acute on chronic diastolic (congestive) heart failure: Secondary | ICD-10-CM | POA: Diagnosis not present

## 2020-04-10 DIAGNOSIS — I2510 Atherosclerotic heart disease of native coronary artery without angina pectoris: Secondary | ICD-10-CM | POA: Diagnosis not present

## 2020-04-10 DIAGNOSIS — D631 Anemia in chronic kidney disease: Secondary | ICD-10-CM | POA: Diagnosis not present

## 2020-04-10 DIAGNOSIS — I132 Hypertensive heart and chronic kidney disease with heart failure and with stage 5 chronic kidney disease, or end stage renal disease: Secondary | ICD-10-CM | POA: Diagnosis not present

## 2020-04-10 DIAGNOSIS — N186 End stage renal disease: Secondary | ICD-10-CM | POA: Diagnosis not present

## 2020-04-12 DIAGNOSIS — N2581 Secondary hyperparathyroidism of renal origin: Secondary | ICD-10-CM | POA: Diagnosis not present

## 2020-04-12 DIAGNOSIS — N186 End stage renal disease: Secondary | ICD-10-CM | POA: Diagnosis not present

## 2020-04-12 DIAGNOSIS — D509 Iron deficiency anemia, unspecified: Secondary | ICD-10-CM | POA: Diagnosis not present

## 2020-04-12 DIAGNOSIS — S72002D Fracture of unspecified part of neck of left femur, subsequent encounter for closed fracture with routine healing: Secondary | ICD-10-CM | POA: Diagnosis not present

## 2020-04-12 DIAGNOSIS — D631 Anemia in chronic kidney disease: Secondary | ICD-10-CM | POA: Diagnosis not present

## 2020-04-12 DIAGNOSIS — Z23 Encounter for immunization: Secondary | ICD-10-CM | POA: Diagnosis not present

## 2020-04-13 ENCOUNTER — Other Ambulatory Visit: Payer: Self-pay

## 2020-04-13 ENCOUNTER — Ambulatory Visit (HOSPITAL_COMMUNITY): Payer: Medicare Other | Attending: Internal Medicine

## 2020-04-13 DIAGNOSIS — I350 Nonrheumatic aortic (valve) stenosis: Secondary | ICD-10-CM | POA: Diagnosis not present

## 2020-04-13 LAB — ECHOCARDIOGRAM COMPLETE
AR max vel: 1.37 cm2
AV Area VTI: 1.35 cm2
AV Area mean vel: 1.31 cm2
AV Mean grad: 9 mmHg
AV Peak grad: 19 mmHg
Ao pk vel: 2.18 m/s
Area-P 1/2: 2.39 cm2
S' Lateral: 3.65 cm

## 2020-04-14 DIAGNOSIS — D509 Iron deficiency anemia, unspecified: Secondary | ICD-10-CM | POA: Diagnosis not present

## 2020-04-14 DIAGNOSIS — N2581 Secondary hyperparathyroidism of renal origin: Secondary | ICD-10-CM | POA: Diagnosis not present

## 2020-04-14 DIAGNOSIS — D631 Anemia in chronic kidney disease: Secondary | ICD-10-CM | POA: Diagnosis not present

## 2020-04-14 DIAGNOSIS — N186 End stage renal disease: Secondary | ICD-10-CM | POA: Diagnosis not present

## 2020-04-14 DIAGNOSIS — Z23 Encounter for immunization: Secondary | ICD-10-CM | POA: Diagnosis not present

## 2020-04-16 DIAGNOSIS — N2581 Secondary hyperparathyroidism of renal origin: Secondary | ICD-10-CM | POA: Diagnosis not present

## 2020-04-16 DIAGNOSIS — D631 Anemia in chronic kidney disease: Secondary | ICD-10-CM | POA: Diagnosis not present

## 2020-04-16 DIAGNOSIS — D509 Iron deficiency anemia, unspecified: Secondary | ICD-10-CM | POA: Diagnosis not present

## 2020-04-16 DIAGNOSIS — Z23 Encounter for immunization: Secondary | ICD-10-CM | POA: Diagnosis not present

## 2020-04-16 DIAGNOSIS — N186 End stage renal disease: Secondary | ICD-10-CM | POA: Diagnosis not present

## 2020-04-19 DIAGNOSIS — N2581 Secondary hyperparathyroidism of renal origin: Secondary | ICD-10-CM | POA: Diagnosis not present

## 2020-04-19 DIAGNOSIS — N186 End stage renal disease: Secondary | ICD-10-CM | POA: Diagnosis not present

## 2020-04-19 DIAGNOSIS — Z23 Encounter for immunization: Secondary | ICD-10-CM | POA: Diagnosis not present

## 2020-04-19 DIAGNOSIS — D509 Iron deficiency anemia, unspecified: Secondary | ICD-10-CM | POA: Diagnosis not present

## 2020-04-19 DIAGNOSIS — D631 Anemia in chronic kidney disease: Secondary | ICD-10-CM | POA: Diagnosis not present

## 2020-04-20 ENCOUNTER — Telehealth: Payer: Self-pay | Admitting: Cardiology

## 2020-04-20 DIAGNOSIS — N185 Chronic kidney disease, stage 5: Secondary | ICD-10-CM | POA: Diagnosis not present

## 2020-04-20 DIAGNOSIS — I2510 Atherosclerotic heart disease of native coronary artery without angina pectoris: Secondary | ICD-10-CM | POA: Diagnosis not present

## 2020-04-20 DIAGNOSIS — Z4902 Encounter for fitting and adjustment of peritoneal dialysis catheter: Secondary | ICD-10-CM | POA: Diagnosis not present

## 2020-04-20 NOTE — Telephone Encounter (Signed)
   [facility] Pre-operative Risk Assessment    HEARTCARE STAFF: - Please ensure there is not already an duplicate clearance open for this procedure. - Under Visit Info/Reason for Call, type in Other and utilize the format Clearance MM/DD/YY or Clearance TBD. Do not use dashes or single digits. - If request is for dental extraction, please clarify the # of teeth to be extracted.  Request for surgical clearance:  1. What type of surgery is being performed? PD Cath placement  For dialysis   2. When is this surgery scheduled? TBD  3. What type of clearance is required (medical clearance vs. Pharmacy clearance to hold med vs. Both)? Medical   4. Are there any medications that need to be held prior to surgery and how long? TBD by Cardiology  5. Practice name and name of physician performing surgery? Dr. Raul Del, [facility]   6. What is the office phone number?[phone]   7.   What is the office fax number? [phone]  8.   Anesthesia type (None, local, MAC, general) ? General    Johnna Acosta 04/20/2020, 12:06 PM  _________________________________________________________________   (provider comments below)

## 2020-04-20 NOTE — Telephone Encounter (Signed)
Left VM

## 2020-04-21 DIAGNOSIS — N186 End stage renal disease: Secondary | ICD-10-CM | POA: Diagnosis not present

## 2020-04-21 DIAGNOSIS — D631 Anemia in chronic kidney disease: Secondary | ICD-10-CM | POA: Diagnosis not present

## 2020-04-21 DIAGNOSIS — N2581 Secondary hyperparathyroidism of renal origin: Secondary | ICD-10-CM | POA: Diagnosis not present

## 2020-04-21 DIAGNOSIS — D509 Iron deficiency anemia, unspecified: Secondary | ICD-10-CM | POA: Diagnosis not present

## 2020-04-21 DIAGNOSIS — Z23 Encounter for immunization: Secondary | ICD-10-CM | POA: Diagnosis not present

## 2020-04-23 DIAGNOSIS — N2581 Secondary hyperparathyroidism of renal origin: Secondary | ICD-10-CM | POA: Diagnosis not present

## 2020-04-23 DIAGNOSIS — D509 Iron deficiency anemia, unspecified: Secondary | ICD-10-CM | POA: Diagnosis not present

## 2020-04-23 DIAGNOSIS — D631 Anemia in chronic kidney disease: Secondary | ICD-10-CM | POA: Diagnosis not present

## 2020-04-23 DIAGNOSIS — Z23 Encounter for immunization: Secondary | ICD-10-CM | POA: Diagnosis not present

## 2020-04-23 DIAGNOSIS — N186 End stage renal disease: Secondary | ICD-10-CM | POA: Diagnosis not present

## 2020-04-24 NOTE — Telephone Encounter (Signed)
   Primary Cardiologist: Fransico Him, MD  Chart reviewed as part of pre-operative protocol coverage. Patient was contacted 04/24/2020 in reference to pre-operative risk assessment for pending surgery as outlined below.  Wendy Walters was last seen on 01/07/2020 by Dr. Radford Pax.  Since that day, Wendy Walters has done well without chest pain or shortness of breath.  She has a normal Myoview year April 2021.  Recent echocardiogram showed a normal ejection fraction.  Patient does not have any recent anginal symptom.  Therefore, based on ACC/AHA guidelines, the patient would be at acceptable risk for the planned procedure without further cardiovascular testing.   The patient was advised that if she develops new symptoms prior to surgery to contact our office to arrange for a follow-up visit, and she verbalized understanding.  I will route this recommendation to the requesting party via Epic fax function and remove from pre-op pool. Please call with questions.  [place], [place] 04/24/2020, 1:04 PM

## 2020-04-25 DIAGNOSIS — L820 Inflamed seborrheic keratosis: Secondary | ICD-10-CM | POA: Diagnosis not present

## 2020-04-25 DIAGNOSIS — Z85828 Personal history of other malignant neoplasm of skin: Secondary | ICD-10-CM | POA: Diagnosis not present

## 2020-04-25 DIAGNOSIS — D234 Other benign neoplasm of skin of scalp and neck: Secondary | ICD-10-CM | POA: Diagnosis not present

## 2020-04-25 DIAGNOSIS — D485 Neoplasm of uncertain behavior of skin: Secondary | ICD-10-CM | POA: Diagnosis not present

## 2020-04-26 DIAGNOSIS — D631 Anemia in chronic kidney disease: Secondary | ICD-10-CM | POA: Diagnosis not present

## 2020-04-26 DIAGNOSIS — N2581 Secondary hyperparathyroidism of renal origin: Secondary | ICD-10-CM | POA: Diagnosis not present

## 2020-04-26 DIAGNOSIS — D509 Iron deficiency anemia, unspecified: Secondary | ICD-10-CM | POA: Diagnosis not present

## 2020-04-26 DIAGNOSIS — N186 End stage renal disease: Secondary | ICD-10-CM | POA: Diagnosis not present

## 2020-04-26 DIAGNOSIS — Z23 Encounter for immunization: Secondary | ICD-10-CM | POA: Diagnosis not present

## 2020-04-26 NOTE — Telephone Encounter (Signed)
[place] called to check on the status of the patient's Surgical Clearance. Please fax results to the office at [phone]

## 2020-04-28 DIAGNOSIS — D631 Anemia in chronic kidney disease: Secondary | ICD-10-CM | POA: Diagnosis not present

## 2020-04-28 DIAGNOSIS — N186 End stage renal disease: Secondary | ICD-10-CM | POA: Diagnosis not present

## 2020-04-28 DIAGNOSIS — D509 Iron deficiency anemia, unspecified: Secondary | ICD-10-CM | POA: Diagnosis not present

## 2020-04-28 DIAGNOSIS — N2581 Secondary hyperparathyroidism of renal origin: Secondary | ICD-10-CM | POA: Diagnosis not present

## 2020-04-28 DIAGNOSIS — Z23 Encounter for immunization: Secondary | ICD-10-CM | POA: Diagnosis not present

## 2020-05-01 DIAGNOSIS — Z23 Encounter for immunization: Secondary | ICD-10-CM | POA: Diagnosis not present

## 2020-05-01 DIAGNOSIS — N186 End stage renal disease: Secondary | ICD-10-CM | POA: Diagnosis not present

## 2020-05-01 DIAGNOSIS — N2581 Secondary hyperparathyroidism of renal origin: Secondary | ICD-10-CM | POA: Diagnosis not present

## 2020-05-01 DIAGNOSIS — D509 Iron deficiency anemia, unspecified: Secondary | ICD-10-CM | POA: Diagnosis not present

## 2020-05-01 DIAGNOSIS — D631 Anemia in chronic kidney disease: Secondary | ICD-10-CM | POA: Diagnosis not present

## 2020-05-03 DIAGNOSIS — D509 Iron deficiency anemia, unspecified: Secondary | ICD-10-CM | POA: Diagnosis not present

## 2020-05-03 DIAGNOSIS — Z23 Encounter for immunization: Secondary | ICD-10-CM | POA: Diagnosis not present

## 2020-05-03 DIAGNOSIS — N186 End stage renal disease: Secondary | ICD-10-CM | POA: Diagnosis not present

## 2020-05-03 DIAGNOSIS — N2581 Secondary hyperparathyroidism of renal origin: Secondary | ICD-10-CM | POA: Diagnosis not present

## 2020-05-03 DIAGNOSIS — D631 Anemia in chronic kidney disease: Secondary | ICD-10-CM | POA: Diagnosis not present

## 2020-05-05 DIAGNOSIS — Z23 Encounter for immunization: Secondary | ICD-10-CM | POA: Diagnosis not present

## 2020-05-05 DIAGNOSIS — D509 Iron deficiency anemia, unspecified: Secondary | ICD-10-CM | POA: Diagnosis not present

## 2020-05-05 DIAGNOSIS — D631 Anemia in chronic kidney disease: Secondary | ICD-10-CM | POA: Diagnosis not present

## 2020-05-05 DIAGNOSIS — N186 End stage renal disease: Secondary | ICD-10-CM | POA: Diagnosis not present

## 2020-05-05 DIAGNOSIS — N2581 Secondary hyperparathyroidism of renal origin: Secondary | ICD-10-CM | POA: Diagnosis not present

## 2020-05-07 DIAGNOSIS — N186 End stage renal disease: Secondary | ICD-10-CM | POA: Diagnosis not present

## 2020-05-07 DIAGNOSIS — I10 Essential (primary) hypertension: Secondary | ICD-10-CM | POA: Diagnosis not present

## 2020-05-07 DIAGNOSIS — D631 Anemia in chronic kidney disease: Secondary | ICD-10-CM | POA: Diagnosis not present

## 2020-05-08 DIAGNOSIS — D631 Anemia in chronic kidney disease: Secondary | ICD-10-CM | POA: Diagnosis not present

## 2020-05-08 DIAGNOSIS — N186 End stage renal disease: Secondary | ICD-10-CM | POA: Diagnosis not present

## 2020-05-08 DIAGNOSIS — Z23 Encounter for immunization: Secondary | ICD-10-CM | POA: Diagnosis not present

## 2020-05-08 DIAGNOSIS — N2581 Secondary hyperparathyroidism of renal origin: Secondary | ICD-10-CM | POA: Diagnosis not present

## 2020-05-08 DIAGNOSIS — D509 Iron deficiency anemia, unspecified: Secondary | ICD-10-CM | POA: Diagnosis not present

## 2020-05-09 ENCOUNTER — Encounter: Payer: Self-pay | Admitting: Cardiology

## 2020-05-09 ENCOUNTER — Other Ambulatory Visit: Payer: Self-pay

## 2020-05-09 ENCOUNTER — Ambulatory Visit (HOSPITAL_COMMUNITY)
Admission: RE | Admit: 2020-05-09 | Discharge: 2020-05-09 | Disposition: A | Discharge status: Home / Self Care | Payer: Medicare Other | Source: Ambulatory Visit | Attending: Cardiology | Admitting: Cardiology

## 2020-05-09 ENCOUNTER — Other Ambulatory Visit (HOSPITAL_COMMUNITY): Payer: Self-pay | Admitting: Cardiology

## 2020-05-09 DIAGNOSIS — I6523 Occlusion and stenosis of bilateral carotid arteries: Secondary | ICD-10-CM

## 2020-05-10 DIAGNOSIS — N186 End stage renal disease: Secondary | ICD-10-CM | POA: Diagnosis not present

## 2020-05-10 DIAGNOSIS — Z23 Encounter for immunization: Secondary | ICD-10-CM | POA: Diagnosis not present

## 2020-05-10 DIAGNOSIS — D509 Iron deficiency anemia, unspecified: Secondary | ICD-10-CM | POA: Diagnosis not present

## 2020-05-10 DIAGNOSIS — N2581 Secondary hyperparathyroidism of renal origin: Secondary | ICD-10-CM | POA: Diagnosis not present

## 2020-05-10 DIAGNOSIS — D631 Anemia in chronic kidney disease: Secondary | ICD-10-CM | POA: Diagnosis not present

## 2020-05-12 DIAGNOSIS — I132 Hypertensive heart and chronic kidney disease with heart failure and with stage 5 chronic kidney disease, or end stage renal disease: Secondary | ICD-10-CM | POA: Diagnosis not present

## 2020-05-12 DIAGNOSIS — Z992 Dependence on renal dialysis: Secondary | ICD-10-CM | POA: Diagnosis not present

## 2020-05-12 DIAGNOSIS — Z96642 Presence of left artificial hip joint: Secondary | ICD-10-CM | POA: Diagnosis not present

## 2020-05-12 DIAGNOSIS — Z9981 Dependence on supplemental oxygen: Secondary | ICD-10-CM | POA: Diagnosis not present

## 2020-05-12 DIAGNOSIS — D631 Anemia in chronic kidney disease: Secondary | ICD-10-CM | POA: Diagnosis not present

## 2020-05-12 DIAGNOSIS — Z7982 Long term (current) use of aspirin: Secondary | ICD-10-CM | POA: Diagnosis not present

## 2020-05-12 DIAGNOSIS — Z23 Encounter for immunization: Secondary | ICD-10-CM | POA: Diagnosis not present

## 2020-05-12 DIAGNOSIS — I5033 Acute on chronic diastolic (congestive) heart failure: Secondary | ICD-10-CM | POA: Diagnosis not present

## 2020-05-12 DIAGNOSIS — M6281 Muscle weakness (generalized): Secondary | ICD-10-CM | POA: Diagnosis not present

## 2020-05-12 DIAGNOSIS — I2510 Atherosclerotic heart disease of native coronary artery without angina pectoris: Secondary | ICD-10-CM | POA: Diagnosis not present

## 2020-05-12 DIAGNOSIS — N186 End stage renal disease: Secondary | ICD-10-CM | POA: Diagnosis not present

## 2020-05-12 DIAGNOSIS — N2581 Secondary hyperparathyroidism of renal origin: Secondary | ICD-10-CM | POA: Diagnosis not present

## 2020-05-12 DIAGNOSIS — D509 Iron deficiency anemia, unspecified: Secondary | ICD-10-CM | POA: Diagnosis not present

## 2020-05-12 DIAGNOSIS — E785 Hyperlipidemia, unspecified: Secondary | ICD-10-CM | POA: Diagnosis not present

## 2020-05-12 DIAGNOSIS — S72002D Fracture of unspecified part of neck of left femur, subsequent encounter for closed fracture with routine healing: Secondary | ICD-10-CM | POA: Diagnosis not present

## 2020-05-12 DIAGNOSIS — Z87891 Personal history of nicotine dependence: Secondary | ICD-10-CM | POA: Diagnosis not present

## 2020-05-13 DIAGNOSIS — D631 Anemia in chronic kidney disease: Secondary | ICD-10-CM | POA: Diagnosis not present

## 2020-05-13 DIAGNOSIS — S72002D Fracture of unspecified part of neck of left femur, subsequent encounter for closed fracture with routine healing: Secondary | ICD-10-CM | POA: Diagnosis not present

## 2020-05-13 DIAGNOSIS — I5033 Acute on chronic diastolic (congestive) heart failure: Secondary | ICD-10-CM | POA: Diagnosis not present

## 2020-05-13 DIAGNOSIS — N186 End stage renal disease: Secondary | ICD-10-CM | POA: Diagnosis not present

## 2020-05-13 DIAGNOSIS — I2510 Atherosclerotic heart disease of native coronary artery without angina pectoris: Secondary | ICD-10-CM | POA: Diagnosis not present

## 2020-05-13 DIAGNOSIS — I132 Hypertensive heart and chronic kidney disease with heart failure and with stage 5 chronic kidney disease, or end stage renal disease: Secondary | ICD-10-CM | POA: Diagnosis not present

## 2020-05-14 DIAGNOSIS — N186 End stage renal disease: Secondary | ICD-10-CM | POA: Diagnosis not present

## 2020-05-14 DIAGNOSIS — D631 Anemia in chronic kidney disease: Secondary | ICD-10-CM | POA: Diagnosis not present

## 2020-05-14 DIAGNOSIS — Z23 Encounter for immunization: Secondary | ICD-10-CM | POA: Diagnosis not present

## 2020-05-14 DIAGNOSIS — D509 Iron deficiency anemia, unspecified: Secondary | ICD-10-CM | POA: Diagnosis not present

## 2020-05-14 DIAGNOSIS — N2581 Secondary hyperparathyroidism of renal origin: Secondary | ICD-10-CM | POA: Diagnosis not present

## 2020-05-16 DIAGNOSIS — I120 Hypertensive chronic kidney disease with stage 5 chronic kidney disease or end stage renal disease: Secondary | ICD-10-CM | POA: Diagnosis not present

## 2020-05-16 DIAGNOSIS — N186 End stage renal disease: Secondary | ICD-10-CM | POA: Diagnosis not present

## 2020-05-16 DIAGNOSIS — N184 Chronic kidney disease, stage 4 (severe): Secondary | ICD-10-CM | POA: Diagnosis not present

## 2020-05-16 DIAGNOSIS — Z955 Presence of coronary angioplasty implant and graft: Secondary | ICD-10-CM | POA: Diagnosis not present

## 2020-05-16 DIAGNOSIS — Z4902 Encounter for fitting and adjustment of peritoneal dialysis catheter: Secondary | ICD-10-CM | POA: Diagnosis not present

## 2020-05-16 DIAGNOSIS — I2510 Atherosclerotic heart disease of native coronary artery without angina pectoris: Secondary | ICD-10-CM | POA: Diagnosis not present

## 2020-05-17 DIAGNOSIS — N2581 Secondary hyperparathyroidism of renal origin: Secondary | ICD-10-CM | POA: Diagnosis not present

## 2020-05-17 DIAGNOSIS — N186 End stage renal disease: Secondary | ICD-10-CM | POA: Diagnosis not present

## 2020-05-17 DIAGNOSIS — D509 Iron deficiency anemia, unspecified: Secondary | ICD-10-CM | POA: Diagnosis not present

## 2020-05-17 DIAGNOSIS — Z23 Encounter for immunization: Secondary | ICD-10-CM | POA: Diagnosis not present

## 2020-05-17 DIAGNOSIS — D631 Anemia in chronic kidney disease: Secondary | ICD-10-CM | POA: Diagnosis not present

## 2020-05-18 DIAGNOSIS — D631 Anemia in chronic kidney disease: Secondary | ICD-10-CM | POA: Diagnosis not present

## 2020-05-18 DIAGNOSIS — I2510 Atherosclerotic heart disease of native coronary artery without angina pectoris: Secondary | ICD-10-CM | POA: Diagnosis not present

## 2020-05-18 DIAGNOSIS — I5033 Acute on chronic diastolic (congestive) heart failure: Secondary | ICD-10-CM | POA: Diagnosis not present

## 2020-05-18 DIAGNOSIS — I132 Hypertensive heart and chronic kidney disease with heart failure and with stage 5 chronic kidney disease, or end stage renal disease: Secondary | ICD-10-CM | POA: Diagnosis not present

## 2020-05-18 DIAGNOSIS — N186 End stage renal disease: Secondary | ICD-10-CM | POA: Diagnosis not present

## 2020-05-18 DIAGNOSIS — S72002D Fracture of unspecified part of neck of left femur, subsequent encounter for closed fracture with routine healing: Secondary | ICD-10-CM | POA: Diagnosis not present

## 2020-05-19 DIAGNOSIS — D631 Anemia in chronic kidney disease: Secondary | ICD-10-CM | POA: Diagnosis not present

## 2020-05-19 DIAGNOSIS — N2581 Secondary hyperparathyroidism of renal origin: Secondary | ICD-10-CM | POA: Diagnosis not present

## 2020-05-19 DIAGNOSIS — Z23 Encounter for immunization: Secondary | ICD-10-CM | POA: Diagnosis not present

## 2020-05-19 DIAGNOSIS — D509 Iron deficiency anemia, unspecified: Secondary | ICD-10-CM | POA: Diagnosis not present

## 2020-05-19 DIAGNOSIS — N186 End stage renal disease: Secondary | ICD-10-CM | POA: Diagnosis not present

## 2020-05-21 DIAGNOSIS — N2581 Secondary hyperparathyroidism of renal origin: Secondary | ICD-10-CM | POA: Diagnosis not present

## 2020-05-21 DIAGNOSIS — N186 End stage renal disease: Secondary | ICD-10-CM | POA: Diagnosis not present

## 2020-05-21 DIAGNOSIS — D631 Anemia in chronic kidney disease: Secondary | ICD-10-CM | POA: Diagnosis not present

## 2020-05-21 DIAGNOSIS — D509 Iron deficiency anemia, unspecified: Secondary | ICD-10-CM | POA: Diagnosis not present

## 2020-05-21 DIAGNOSIS — Z23 Encounter for immunization: Secondary | ICD-10-CM | POA: Diagnosis not present

## 2020-05-23 IMAGING — CR DG CHEST 2V
2 series · 2 of 2 positions shown · non-contrast
Comparison: Radiograph 02/27/2017, CT 03/05/2015

CLINICAL DATA: Shortness of breath, cough and shortness of breath,
denies chest pain fever

EXAM:
CHEST - 2 VIEW

[w chest lat]
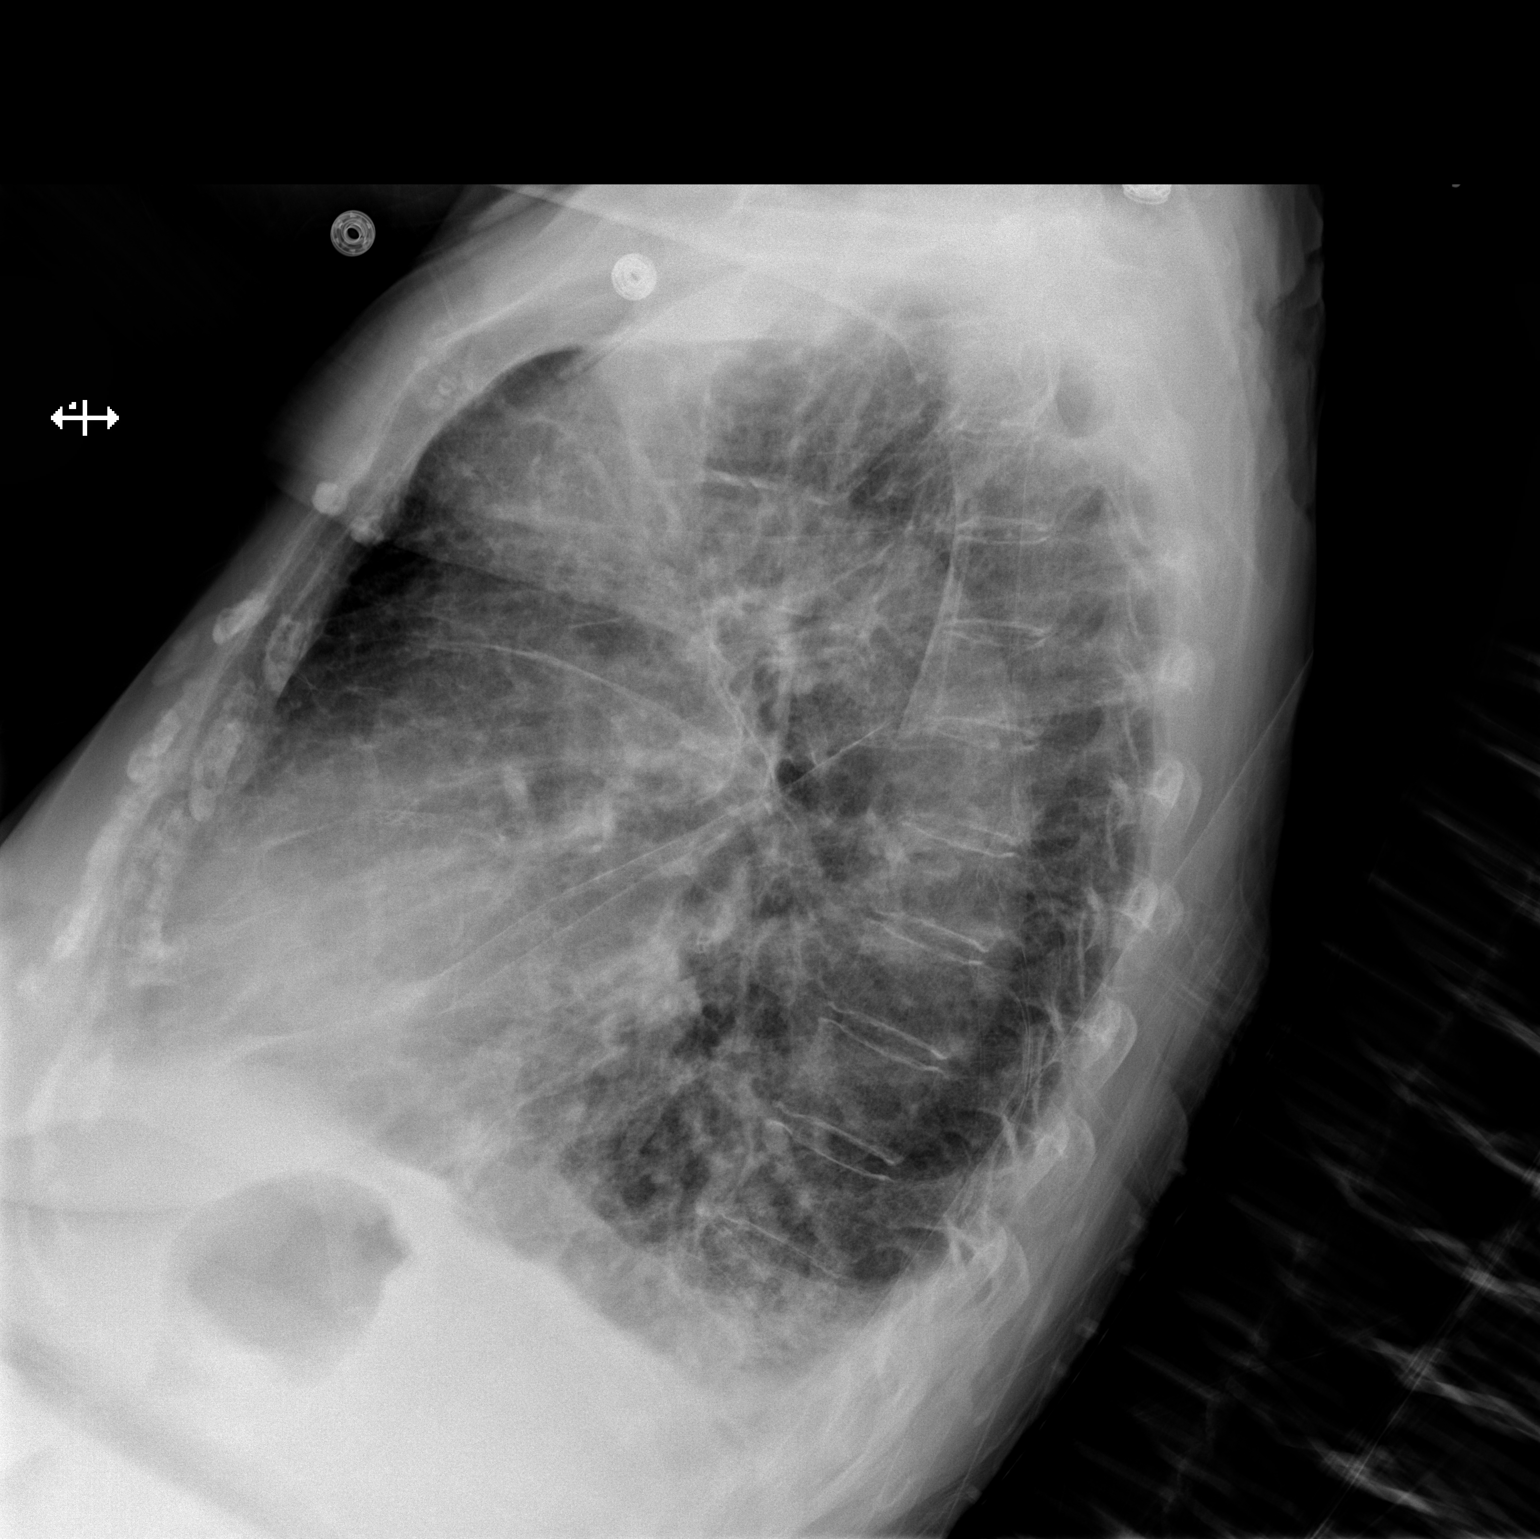

[x chest ap]
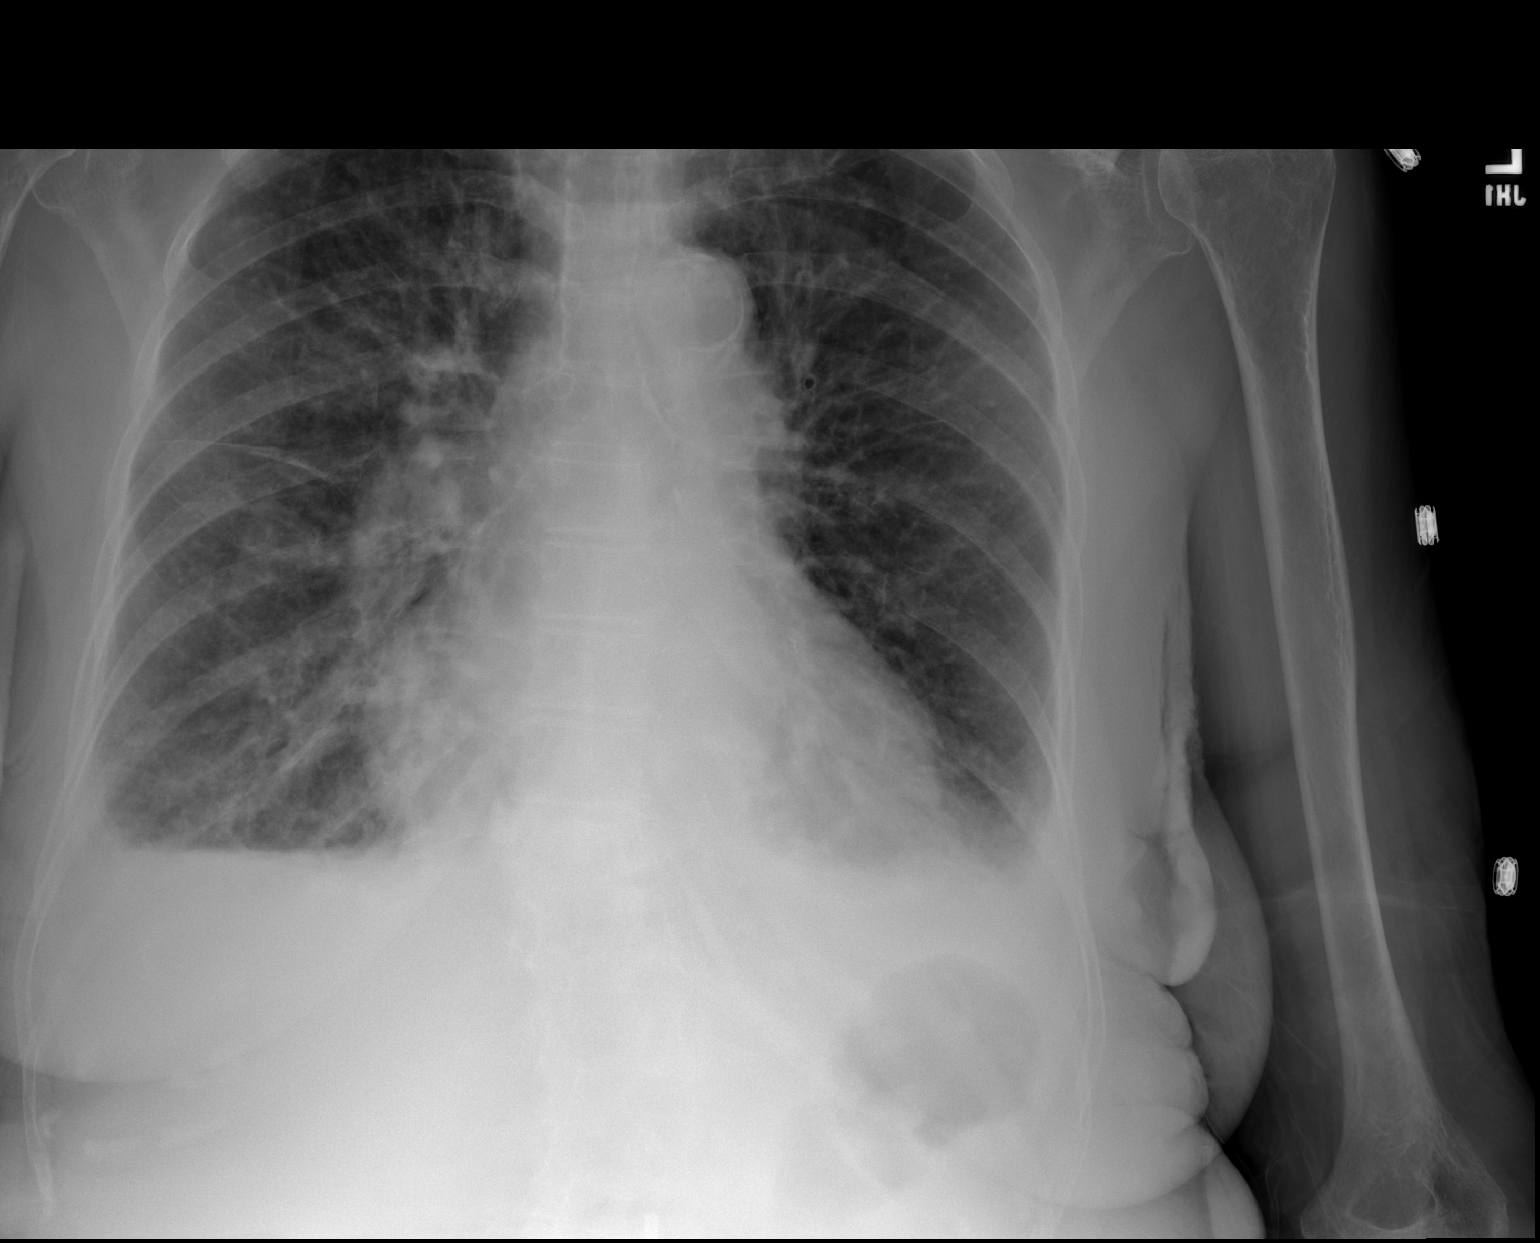

[2 of 2 positions shown; findings below may reference images not displayed]

FINDINGS: Diffuse hazy interstitial opacities with some fissural and septal
thickening, cephalized vascularity and central vascular cuffing.
Bilateral effusions. No pneumothorax. There is borderline
cardiomegaly. The aorta is calcified. Degenerative changes are
present in the imaged spine and shoulders. High-riding appearance of
the humeral heads may reflect underlying rotator cuff tendinopathy.
No acute osseous or soft tissue abnormality.
IMPRESSION: 1. Findings compatible with CHF including bilateral effusions,
features of edema, and mild cardiomegaly.
2.  Aortic Atherosclerosis ([id number].P).

## 2020-05-24 DIAGNOSIS — N186 End stage renal disease: Secondary | ICD-10-CM | POA: Diagnosis not present

## 2020-05-24 DIAGNOSIS — D631 Anemia in chronic kidney disease: Secondary | ICD-10-CM | POA: Diagnosis not present

## 2020-05-24 DIAGNOSIS — N2581 Secondary hyperparathyroidism of renal origin: Secondary | ICD-10-CM | POA: Diagnosis not present

## 2020-05-24 DIAGNOSIS — D509 Iron deficiency anemia, unspecified: Secondary | ICD-10-CM | POA: Diagnosis not present

## 2020-05-24 DIAGNOSIS — Z23 Encounter for immunization: Secondary | ICD-10-CM | POA: Diagnosis not present

## 2020-05-26 DIAGNOSIS — D631 Anemia in chronic kidney disease: Secondary | ICD-10-CM | POA: Diagnosis not present

## 2020-05-26 DIAGNOSIS — D509 Iron deficiency anemia, unspecified: Secondary | ICD-10-CM | POA: Diagnosis not present

## 2020-05-26 DIAGNOSIS — N2581 Secondary hyperparathyroidism of renal origin: Secondary | ICD-10-CM | POA: Diagnosis not present

## 2020-05-26 DIAGNOSIS — Z23 Encounter for immunization: Secondary | ICD-10-CM | POA: Diagnosis not present

## 2020-05-26 DIAGNOSIS — N186 End stage renal disease: Secondary | ICD-10-CM | POA: Diagnosis not present

## 2020-05-28 DIAGNOSIS — N186 End stage renal disease: Secondary | ICD-10-CM | POA: Diagnosis not present

## 2020-05-28 DIAGNOSIS — D509 Iron deficiency anemia, unspecified: Secondary | ICD-10-CM | POA: Diagnosis not present

## 2020-05-28 DIAGNOSIS — N2581 Secondary hyperparathyroidism of renal origin: Secondary | ICD-10-CM | POA: Diagnosis not present

## 2020-05-28 DIAGNOSIS — D631 Anemia in chronic kidney disease: Secondary | ICD-10-CM | POA: Diagnosis not present

## 2020-05-28 DIAGNOSIS — Z23 Encounter for immunization: Secondary | ICD-10-CM | POA: Diagnosis not present

## 2020-05-31 DIAGNOSIS — D509 Iron deficiency anemia, unspecified: Secondary | ICD-10-CM | POA: Diagnosis not present

## 2020-05-31 DIAGNOSIS — N186 End stage renal disease: Secondary | ICD-10-CM | POA: Diagnosis not present

## 2020-05-31 DIAGNOSIS — Z23 Encounter for immunization: Secondary | ICD-10-CM | POA: Diagnosis not present

## 2020-05-31 DIAGNOSIS — D631 Anemia in chronic kidney disease: Secondary | ICD-10-CM | POA: Diagnosis not present

## 2020-05-31 DIAGNOSIS — N2581 Secondary hyperparathyroidism of renal origin: Secondary | ICD-10-CM | POA: Diagnosis not present

## 2020-06-01 DIAGNOSIS — I132 Hypertensive heart and chronic kidney disease with heart failure and with stage 5 chronic kidney disease, or end stage renal disease: Secondary | ICD-10-CM | POA: Diagnosis not present

## 2020-06-01 DIAGNOSIS — I5033 Acute on chronic diastolic (congestive) heart failure: Secondary | ICD-10-CM | POA: Diagnosis not present

## 2020-06-01 DIAGNOSIS — N186 End stage renal disease: Secondary | ICD-10-CM | POA: Diagnosis not present

## 2020-06-01 DIAGNOSIS — I2510 Atherosclerotic heart disease of native coronary artery without angina pectoris: Secondary | ICD-10-CM | POA: Diagnosis not present

## 2020-06-01 DIAGNOSIS — S72002D Fracture of unspecified part of neck of left femur, subsequent encounter for closed fracture with routine healing: Secondary | ICD-10-CM | POA: Diagnosis not present

## 2020-06-01 DIAGNOSIS — D631 Anemia in chronic kidney disease: Secondary | ICD-10-CM | POA: Diagnosis not present

## 2020-06-02 DIAGNOSIS — N2581 Secondary hyperparathyroidism of renal origin: Secondary | ICD-10-CM | POA: Diagnosis not present

## 2020-06-02 DIAGNOSIS — N186 End stage renal disease: Secondary | ICD-10-CM | POA: Diagnosis not present

## 2020-06-02 DIAGNOSIS — Z23 Encounter for immunization: Secondary | ICD-10-CM | POA: Diagnosis not present

## 2020-06-02 DIAGNOSIS — D509 Iron deficiency anemia, unspecified: Secondary | ICD-10-CM | POA: Diagnosis not present

## 2020-06-02 DIAGNOSIS — D631 Anemia in chronic kidney disease: Secondary | ICD-10-CM | POA: Diagnosis not present

## 2020-06-04 DIAGNOSIS — Z23 Encounter for immunization: Secondary | ICD-10-CM | POA: Diagnosis not present

## 2020-06-04 DIAGNOSIS — N2581 Secondary hyperparathyroidism of renal origin: Secondary | ICD-10-CM | POA: Diagnosis not present

## 2020-06-04 DIAGNOSIS — D631 Anemia in chronic kidney disease: Secondary | ICD-10-CM | POA: Diagnosis not present

## 2020-06-04 DIAGNOSIS — D509 Iron deficiency anemia, unspecified: Secondary | ICD-10-CM | POA: Diagnosis not present

## 2020-06-04 DIAGNOSIS — N186 End stage renal disease: Secondary | ICD-10-CM | POA: Diagnosis not present

## 2020-06-05 DIAGNOSIS — S7292XA Unspecified fracture of left femur, initial encounter for closed fracture: Secondary | ICD-10-CM | POA: Diagnosis not present

## 2020-06-05 DIAGNOSIS — Y998 Other external cause status: Secondary | ICD-10-CM | POA: Diagnosis not present

## 2020-06-05 DIAGNOSIS — I10 Essential (primary) hypertension: Secondary | ICD-10-CM | POA: Diagnosis not present

## 2020-06-05 DIAGNOSIS — X58XXXA Exposure to other specified factors, initial encounter: Secondary | ICD-10-CM | POA: Diagnosis not present

## 2020-06-05 DIAGNOSIS — Z96642 Presence of left artificial hip joint: Secondary | ICD-10-CM | POA: Diagnosis not present

## 2020-06-05 DIAGNOSIS — R52 Pain, unspecified: Secondary | ICD-10-CM | POA: Diagnosis not present

## 2020-06-05 DIAGNOSIS — R0902 Hypoxemia: Secondary | ICD-10-CM | POA: Diagnosis not present

## 2020-06-05 DIAGNOSIS — M25552 Pain in left hip: Secondary | ICD-10-CM | POA: Diagnosis not present

## 2020-06-07 DIAGNOSIS — N186 End stage renal disease: Secondary | ICD-10-CM | POA: Diagnosis not present

## 2020-06-07 DIAGNOSIS — D509 Iron deficiency anemia, unspecified: Secondary | ICD-10-CM | POA: Diagnosis not present

## 2020-06-07 DIAGNOSIS — I10 Essential (primary) hypertension: Secondary | ICD-10-CM | POA: Diagnosis not present

## 2020-06-07 DIAGNOSIS — D631 Anemia in chronic kidney disease: Secondary | ICD-10-CM | POA: Diagnosis not present

## 2020-06-09 DIAGNOSIS — N186 End stage renal disease: Secondary | ICD-10-CM | POA: Diagnosis not present

## 2020-06-09 DIAGNOSIS — D509 Iron deficiency anemia, unspecified: Secondary | ICD-10-CM | POA: Diagnosis not present

## 2020-06-09 DIAGNOSIS — D631 Anemia in chronic kidney disease: Secondary | ICD-10-CM | POA: Diagnosis not present

## 2020-06-10 DIAGNOSIS — Z4902 Encounter for fitting and adjustment of peritoneal dialysis catheter: Secondary | ICD-10-CM | POA: Diagnosis not present

## 2020-06-11 DIAGNOSIS — D631 Anemia in chronic kidney disease: Secondary | ICD-10-CM | POA: Diagnosis not present

## 2020-06-11 DIAGNOSIS — N186 End stage renal disease: Secondary | ICD-10-CM | POA: Diagnosis not present

## 2020-06-11 DIAGNOSIS — D509 Iron deficiency anemia, unspecified: Secondary | ICD-10-CM | POA: Diagnosis not present

## 2020-06-13 DIAGNOSIS — N186 End stage renal disease: Secondary | ICD-10-CM | POA: Diagnosis not present

## 2020-06-13 DIAGNOSIS — N2581 Secondary hyperparathyroidism of renal origin: Secondary | ICD-10-CM | POA: Diagnosis not present

## 2020-06-13 DIAGNOSIS — D631 Anemia in chronic kidney disease: Secondary | ICD-10-CM | POA: Diagnosis not present

## 2020-06-14 DIAGNOSIS — D631 Anemia in chronic kidney disease: Secondary | ICD-10-CM | POA: Diagnosis not present

## 2020-06-14 DIAGNOSIS — N186 End stage renal disease: Secondary | ICD-10-CM | POA: Diagnosis not present

## 2020-06-14 DIAGNOSIS — N2581 Secondary hyperparathyroidism of renal origin: Secondary | ICD-10-CM | POA: Diagnosis not present

## 2020-06-15 DIAGNOSIS — N186 End stage renal disease: Secondary | ICD-10-CM | POA: Diagnosis not present

## 2020-06-15 DIAGNOSIS — N2581 Secondary hyperparathyroidism of renal origin: Secondary | ICD-10-CM | POA: Diagnosis not present

## 2020-06-15 DIAGNOSIS — D631 Anemia in chronic kidney disease: Secondary | ICD-10-CM | POA: Diagnosis not present

## 2020-06-16 DIAGNOSIS — D631 Anemia in chronic kidney disease: Secondary | ICD-10-CM | POA: Diagnosis not present

## 2020-06-16 DIAGNOSIS — N2581 Secondary hyperparathyroidism of renal origin: Secondary | ICD-10-CM | POA: Diagnosis not present

## 2020-06-16 DIAGNOSIS — N186 End stage renal disease: Secondary | ICD-10-CM | POA: Diagnosis not present

## 2020-06-17 DIAGNOSIS — N186 End stage renal disease: Secondary | ICD-10-CM | POA: Diagnosis not present

## 2020-06-17 DIAGNOSIS — D631 Anemia in chronic kidney disease: Secondary | ICD-10-CM | POA: Diagnosis not present

## 2020-06-18 DIAGNOSIS — N186 End stage renal disease: Secondary | ICD-10-CM | POA: Diagnosis not present

## 2020-06-18 DIAGNOSIS — D631 Anemia in chronic kidney disease: Secondary | ICD-10-CM | POA: Diagnosis not present

## 2020-06-19 DIAGNOSIS — D631 Anemia in chronic kidney disease: Secondary | ICD-10-CM | POA: Diagnosis not present

## 2020-06-19 DIAGNOSIS — N186 End stage renal disease: Secondary | ICD-10-CM | POA: Diagnosis not present

## 2020-06-20 DIAGNOSIS — Z96642 Presence of left artificial hip joint: Secondary | ICD-10-CM | POA: Diagnosis not present

## 2020-06-20 DIAGNOSIS — M8000XA Age-related osteoporosis with current pathological fracture, unspecified site, initial encounter for fracture: Secondary | ICD-10-CM | POA: Diagnosis not present

## 2020-06-20 DIAGNOSIS — N186 End stage renal disease: Secondary | ICD-10-CM | POA: Diagnosis not present

## 2020-06-20 DIAGNOSIS — D631 Anemia in chronic kidney disease: Secondary | ICD-10-CM | POA: Diagnosis not present

## 2020-06-20 DIAGNOSIS — Z96649 Presence of unspecified artificial hip joint: Secondary | ICD-10-CM | POA: Diagnosis not present

## 2020-06-20 DIAGNOSIS — S72112A Displaced fracture of greater trochanter of left femur, initial encounter for closed fracture: Secondary | ICD-10-CM | POA: Diagnosis not present

## 2020-06-21 DIAGNOSIS — N186 End stage renal disease: Secondary | ICD-10-CM | POA: Diagnosis not present

## 2020-06-21 DIAGNOSIS — D631 Anemia in chronic kidney disease: Secondary | ICD-10-CM | POA: Diagnosis not present

## 2020-06-22 DIAGNOSIS — D631 Anemia in chronic kidney disease: Secondary | ICD-10-CM | POA: Diagnosis not present

## 2020-06-22 DIAGNOSIS — N186 End stage renal disease: Secondary | ICD-10-CM | POA: Diagnosis not present

## 2020-06-23 DIAGNOSIS — N186 End stage renal disease: Secondary | ICD-10-CM | POA: Diagnosis not present

## 2020-06-23 DIAGNOSIS — D631 Anemia in chronic kidney disease: Secondary | ICD-10-CM | POA: Diagnosis not present

## 2020-06-24 DIAGNOSIS — N186 End stage renal disease: Secondary | ICD-10-CM | POA: Diagnosis not present

## 2020-06-24 DIAGNOSIS — D631 Anemia in chronic kidney disease: Secondary | ICD-10-CM | POA: Diagnosis not present

## 2020-06-25 DIAGNOSIS — N186 End stage renal disease: Secondary | ICD-10-CM | POA: Diagnosis not present

## 2020-06-25 DIAGNOSIS — D631 Anemia in chronic kidney disease: Secondary | ICD-10-CM | POA: Diagnosis not present

## 2020-06-26 DIAGNOSIS — N186 End stage renal disease: Secondary | ICD-10-CM | POA: Diagnosis not present

## 2020-06-26 DIAGNOSIS — D631 Anemia in chronic kidney disease: Secondary | ICD-10-CM | POA: Diagnosis not present

## 2020-06-27 DIAGNOSIS — N186 End stage renal disease: Secondary | ICD-10-CM | POA: Diagnosis not present

## 2020-06-27 DIAGNOSIS — D631 Anemia in chronic kidney disease: Secondary | ICD-10-CM | POA: Diagnosis not present

## 2020-06-28 DIAGNOSIS — D631 Anemia in chronic kidney disease: Secondary | ICD-10-CM | POA: Diagnosis not present

## 2020-06-28 DIAGNOSIS — N186 End stage renal disease: Secondary | ICD-10-CM | POA: Diagnosis not present

## 2020-06-29 DIAGNOSIS — M8000XA Age-related osteoporosis with current pathological fracture, unspecified site, initial encounter for fracture: Secondary | ICD-10-CM | POA: Diagnosis not present

## 2020-06-29 DIAGNOSIS — Z7189 Other specified counseling: Secondary | ICD-10-CM | POA: Diagnosis not present

## 2020-06-29 DIAGNOSIS — D631 Anemia in chronic kidney disease: Secondary | ICD-10-CM | POA: Diagnosis not present

## 2020-06-29 DIAGNOSIS — N186 End stage renal disease: Secondary | ICD-10-CM | POA: Diagnosis not present

## 2020-06-29 DIAGNOSIS — N184 Chronic kidney disease, stage 4 (severe): Secondary | ICD-10-CM | POA: Diagnosis not present

## 2020-06-30 DIAGNOSIS — D631 Anemia in chronic kidney disease: Secondary | ICD-10-CM | POA: Diagnosis not present

## 2020-06-30 DIAGNOSIS — N186 End stage renal disease: Secondary | ICD-10-CM | POA: Diagnosis not present

## 2020-07-01 DIAGNOSIS — D631 Anemia in chronic kidney disease: Secondary | ICD-10-CM | POA: Diagnosis not present

## 2020-07-01 DIAGNOSIS — N186 End stage renal disease: Secondary | ICD-10-CM | POA: Diagnosis not present

## 2020-07-02 DIAGNOSIS — D631 Anemia in chronic kidney disease: Secondary | ICD-10-CM | POA: Diagnosis not present

## 2020-07-02 DIAGNOSIS — N186 End stage renal disease: Secondary | ICD-10-CM | POA: Diagnosis not present

## 2020-07-03 DIAGNOSIS — D631 Anemia in chronic kidney disease: Secondary | ICD-10-CM | POA: Diagnosis not present

## 2020-07-03 DIAGNOSIS — N186 End stage renal disease: Secondary | ICD-10-CM | POA: Diagnosis not present

## 2020-07-04 DIAGNOSIS — N186 End stage renal disease: Secondary | ICD-10-CM | POA: Diagnosis not present

## 2020-07-04 DIAGNOSIS — D631 Anemia in chronic kidney disease: Secondary | ICD-10-CM | POA: Diagnosis not present

## 2020-07-05 DIAGNOSIS — N2581 Secondary hyperparathyroidism of renal origin: Secondary | ICD-10-CM | POA: Diagnosis not present

## 2020-07-05 DIAGNOSIS — D631 Anemia in chronic kidney disease: Secondary | ICD-10-CM | POA: Diagnosis not present

## 2020-07-05 DIAGNOSIS — I10 Essential (primary) hypertension: Secondary | ICD-10-CM | POA: Diagnosis not present

## 2020-07-05 DIAGNOSIS — N186 End stage renal disease: Secondary | ICD-10-CM | POA: Diagnosis not present

## 2020-07-06 ENCOUNTER — Ambulatory Visit: Payer: Medicare Other | Admitting: Cardiology

## 2020-07-06 DIAGNOSIS — D631 Anemia in chronic kidney disease: Secondary | ICD-10-CM | POA: Diagnosis not present

## 2020-07-06 DIAGNOSIS — N2581 Secondary hyperparathyroidism of renal origin: Secondary | ICD-10-CM | POA: Diagnosis not present

## 2020-07-06 DIAGNOSIS — M810 Age-related osteoporosis without current pathological fracture: Secondary | ICD-10-CM | POA: Diagnosis not present

## 2020-07-06 DIAGNOSIS — Z780 Asymptomatic menopausal state: Secondary | ICD-10-CM | POA: Diagnosis not present

## 2020-07-06 DIAGNOSIS — N186 End stage renal disease: Secondary | ICD-10-CM | POA: Diagnosis not present

## 2020-07-07 DIAGNOSIS — N186 End stage renal disease: Secondary | ICD-10-CM | POA: Diagnosis not present

## 2020-07-07 DIAGNOSIS — Z1159 Encounter for screening for other viral diseases: Secondary | ICD-10-CM | POA: Diagnosis not present

## 2020-07-07 DIAGNOSIS — N2581 Secondary hyperparathyroidism of renal origin: Secondary | ICD-10-CM | POA: Diagnosis not present

## 2020-07-07 DIAGNOSIS — D631 Anemia in chronic kidney disease: Secondary | ICD-10-CM | POA: Diagnosis not present

## 2020-07-08 DIAGNOSIS — D631 Anemia in chronic kidney disease: Secondary | ICD-10-CM | POA: Diagnosis not present

## 2020-07-08 DIAGNOSIS — N2581 Secondary hyperparathyroidism of renal origin: Secondary | ICD-10-CM | POA: Diagnosis not present

## 2020-07-08 DIAGNOSIS — N186 End stage renal disease: Secondary | ICD-10-CM | POA: Diagnosis not present

## 2020-07-09 DIAGNOSIS — N2581 Secondary hyperparathyroidism of renal origin: Secondary | ICD-10-CM | POA: Diagnosis not present

## 2020-07-09 DIAGNOSIS — N186 End stage renal disease: Secondary | ICD-10-CM | POA: Diagnosis not present

## 2020-07-09 DIAGNOSIS — D631 Anemia in chronic kidney disease: Secondary | ICD-10-CM | POA: Diagnosis not present

## 2020-07-10 DIAGNOSIS — N2581 Secondary hyperparathyroidism of renal origin: Secondary | ICD-10-CM | POA: Diagnosis not present

## 2020-07-10 DIAGNOSIS — N186 End stage renal disease: Secondary | ICD-10-CM | POA: Diagnosis not present

## 2020-07-10 DIAGNOSIS — D631 Anemia in chronic kidney disease: Secondary | ICD-10-CM | POA: Diagnosis not present

## 2020-07-11 DIAGNOSIS — D631 Anemia in chronic kidney disease: Secondary | ICD-10-CM | POA: Diagnosis not present

## 2020-07-11 DIAGNOSIS — N186 End stage renal disease: Secondary | ICD-10-CM | POA: Diagnosis not present

## 2020-07-11 DIAGNOSIS — N2581 Secondary hyperparathyroidism of renal origin: Secondary | ICD-10-CM | POA: Diagnosis not present

## 2020-07-12 DIAGNOSIS — N186 End stage renal disease: Secondary | ICD-10-CM | POA: Diagnosis not present

## 2020-07-12 DIAGNOSIS — N2581 Secondary hyperparathyroidism of renal origin: Secondary | ICD-10-CM | POA: Diagnosis not present

## 2020-07-12 DIAGNOSIS — D631 Anemia in chronic kidney disease: Secondary | ICD-10-CM | POA: Diagnosis not present

## 2020-07-13 DIAGNOSIS — N2581 Secondary hyperparathyroidism of renal origin: Secondary | ICD-10-CM | POA: Diagnosis not present

## 2020-07-13 DIAGNOSIS — D631 Anemia in chronic kidney disease: Secondary | ICD-10-CM | POA: Diagnosis not present

## 2020-07-13 DIAGNOSIS — N186 End stage renal disease: Secondary | ICD-10-CM | POA: Diagnosis not present

## 2020-07-14 DIAGNOSIS — D631 Anemia in chronic kidney disease: Secondary | ICD-10-CM | POA: Diagnosis not present

## 2020-07-14 DIAGNOSIS — N186 End stage renal disease: Secondary | ICD-10-CM | POA: Diagnosis not present

## 2020-07-14 DIAGNOSIS — N2581 Secondary hyperparathyroidism of renal origin: Secondary | ICD-10-CM | POA: Diagnosis not present

## 2020-07-15 DIAGNOSIS — N186 End stage renal disease: Secondary | ICD-10-CM | POA: Diagnosis not present

## 2020-07-15 DIAGNOSIS — D631 Anemia in chronic kidney disease: Secondary | ICD-10-CM | POA: Diagnosis not present

## 2020-07-15 DIAGNOSIS — N2581 Secondary hyperparathyroidism of renal origin: Secondary | ICD-10-CM | POA: Diagnosis not present

## 2020-07-16 DIAGNOSIS — N2581 Secondary hyperparathyroidism of renal origin: Secondary | ICD-10-CM | POA: Diagnosis not present

## 2020-07-16 DIAGNOSIS — N186 End stage renal disease: Secondary | ICD-10-CM | POA: Diagnosis not present

## 2020-07-16 DIAGNOSIS — D631 Anemia in chronic kidney disease: Secondary | ICD-10-CM | POA: Diagnosis not present

## 2020-07-17 DIAGNOSIS — N186 End stage renal disease: Secondary | ICD-10-CM | POA: Diagnosis not present

## 2020-07-17 DIAGNOSIS — D631 Anemia in chronic kidney disease: Secondary | ICD-10-CM | POA: Diagnosis not present

## 2020-07-17 DIAGNOSIS — N2581 Secondary hyperparathyroidism of renal origin: Secondary | ICD-10-CM | POA: Diagnosis not present

## 2020-07-18 DIAGNOSIS — N2581 Secondary hyperparathyroidism of renal origin: Secondary | ICD-10-CM | POA: Diagnosis not present

## 2020-07-18 DIAGNOSIS — N186 End stage renal disease: Secondary | ICD-10-CM | POA: Diagnosis not present

## 2020-07-18 DIAGNOSIS — D631 Anemia in chronic kidney disease: Secondary | ICD-10-CM | POA: Diagnosis not present

## 2020-07-19 DIAGNOSIS — D631 Anemia in chronic kidney disease: Secondary | ICD-10-CM | POA: Diagnosis not present

## 2020-07-19 DIAGNOSIS — N2581 Secondary hyperparathyroidism of renal origin: Secondary | ICD-10-CM | POA: Diagnosis not present

## 2020-07-19 DIAGNOSIS — N186 End stage renal disease: Secondary | ICD-10-CM | POA: Diagnosis not present

## 2020-07-20 DIAGNOSIS — N186 End stage renal disease: Secondary | ICD-10-CM | POA: Diagnosis not present

## 2020-07-20 DIAGNOSIS — D631 Anemia in chronic kidney disease: Secondary | ICD-10-CM | POA: Diagnosis not present

## 2020-07-20 DIAGNOSIS — N2581 Secondary hyperparathyroidism of renal origin: Secondary | ICD-10-CM | POA: Diagnosis not present

## 2020-07-21 DIAGNOSIS — D631 Anemia in chronic kidney disease: Secondary | ICD-10-CM | POA: Diagnosis not present

## 2020-07-21 DIAGNOSIS — N2581 Secondary hyperparathyroidism of renal origin: Secondary | ICD-10-CM | POA: Diagnosis not present

## 2020-07-21 DIAGNOSIS — N186 End stage renal disease: Secondary | ICD-10-CM | POA: Diagnosis not present

## 2020-07-22 DIAGNOSIS — D631 Anemia in chronic kidney disease: Secondary | ICD-10-CM | POA: Diagnosis not present

## 2020-07-22 DIAGNOSIS — N186 End stage renal disease: Secondary | ICD-10-CM | POA: Diagnosis not present

## 2020-07-22 DIAGNOSIS — N2581 Secondary hyperparathyroidism of renal origin: Secondary | ICD-10-CM | POA: Diagnosis not present

## 2020-07-23 DIAGNOSIS — D631 Anemia in chronic kidney disease: Secondary | ICD-10-CM | POA: Diagnosis not present

## 2020-07-23 DIAGNOSIS — N186 End stage renal disease: Secondary | ICD-10-CM | POA: Diagnosis not present

## 2020-07-23 DIAGNOSIS — N2581 Secondary hyperparathyroidism of renal origin: Secondary | ICD-10-CM | POA: Diagnosis not present

## 2020-07-24 DIAGNOSIS — N2581 Secondary hyperparathyroidism of renal origin: Secondary | ICD-10-CM | POA: Diagnosis not present

## 2020-07-24 DIAGNOSIS — N186 End stage renal disease: Secondary | ICD-10-CM | POA: Diagnosis not present

## 2020-07-24 DIAGNOSIS — D631 Anemia in chronic kidney disease: Secondary | ICD-10-CM | POA: Diagnosis not present

## 2020-07-25 DIAGNOSIS — N186 End stage renal disease: Secondary | ICD-10-CM | POA: Diagnosis not present

## 2020-07-25 DIAGNOSIS — N2581 Secondary hyperparathyroidism of renal origin: Secondary | ICD-10-CM | POA: Diagnosis not present

## 2020-07-25 DIAGNOSIS — D631 Anemia in chronic kidney disease: Secondary | ICD-10-CM | POA: Diagnosis not present

## 2020-07-26 DIAGNOSIS — N2581 Secondary hyperparathyroidism of renal origin: Secondary | ICD-10-CM | POA: Diagnosis not present

## 2020-07-26 DIAGNOSIS — D631 Anemia in chronic kidney disease: Secondary | ICD-10-CM | POA: Diagnosis not present

## 2020-07-26 DIAGNOSIS — N186 End stage renal disease: Secondary | ICD-10-CM | POA: Diagnosis not present

## 2020-07-27 DIAGNOSIS — N186 End stage renal disease: Secondary | ICD-10-CM | POA: Diagnosis not present

## 2020-07-27 DIAGNOSIS — N2581 Secondary hyperparathyroidism of renal origin: Secondary | ICD-10-CM | POA: Diagnosis not present

## 2020-07-27 DIAGNOSIS — D631 Anemia in chronic kidney disease: Secondary | ICD-10-CM | POA: Diagnosis not present

## 2020-07-28 DIAGNOSIS — D631 Anemia in chronic kidney disease: Secondary | ICD-10-CM | POA: Diagnosis not present

## 2020-07-28 DIAGNOSIS — N186 End stage renal disease: Secondary | ICD-10-CM | POA: Diagnosis not present

## 2020-07-28 DIAGNOSIS — N2581 Secondary hyperparathyroidism of renal origin: Secondary | ICD-10-CM | POA: Diagnosis not present

## 2020-07-29 DIAGNOSIS — N2581 Secondary hyperparathyroidism of renal origin: Secondary | ICD-10-CM | POA: Diagnosis not present

## 2020-07-29 DIAGNOSIS — N186 End stage renal disease: Secondary | ICD-10-CM | POA: Diagnosis not present

## 2020-07-29 DIAGNOSIS — D631 Anemia in chronic kidney disease: Secondary | ICD-10-CM | POA: Diagnosis not present

## 2020-07-30 DIAGNOSIS — N186 End stage renal disease: Secondary | ICD-10-CM | POA: Diagnosis not present

## 2020-07-30 DIAGNOSIS — N2581 Secondary hyperparathyroidism of renal origin: Secondary | ICD-10-CM | POA: Diagnosis not present

## 2020-07-30 DIAGNOSIS — D631 Anemia in chronic kidney disease: Secondary | ICD-10-CM | POA: Diagnosis not present

## 2020-07-31 DIAGNOSIS — N2581 Secondary hyperparathyroidism of renal origin: Secondary | ICD-10-CM | POA: Diagnosis not present

## 2020-07-31 DIAGNOSIS — D631 Anemia in chronic kidney disease: Secondary | ICD-10-CM | POA: Diagnosis not present

## 2020-07-31 DIAGNOSIS — N186 End stage renal disease: Secondary | ICD-10-CM | POA: Diagnosis not present

## 2020-08-01 DIAGNOSIS — N2581 Secondary hyperparathyroidism of renal origin: Secondary | ICD-10-CM | POA: Diagnosis not present

## 2020-08-01 DIAGNOSIS — N186 End stage renal disease: Secondary | ICD-10-CM | POA: Diagnosis not present

## 2020-08-01 DIAGNOSIS — D631 Anemia in chronic kidney disease: Secondary | ICD-10-CM | POA: Diagnosis not present

## 2020-08-02 DIAGNOSIS — N2581 Secondary hyperparathyroidism of renal origin: Secondary | ICD-10-CM | POA: Diagnosis not present

## 2020-08-02 DIAGNOSIS — N186 End stage renal disease: Secondary | ICD-10-CM | POA: Diagnosis not present

## 2020-08-02 DIAGNOSIS — D631 Anemia in chronic kidney disease: Secondary | ICD-10-CM | POA: Diagnosis not present

## 2020-08-03 DIAGNOSIS — N186 End stage renal disease: Secondary | ICD-10-CM | POA: Diagnosis not present

## 2020-08-03 DIAGNOSIS — D631 Anemia in chronic kidney disease: Secondary | ICD-10-CM | POA: Diagnosis not present

## 2020-08-03 DIAGNOSIS — N2581 Secondary hyperparathyroidism of renal origin: Secondary | ICD-10-CM | POA: Diagnosis not present

## 2020-08-04 DIAGNOSIS — D631 Anemia in chronic kidney disease: Secondary | ICD-10-CM | POA: Diagnosis not present

## 2020-08-04 DIAGNOSIS — N2581 Secondary hyperparathyroidism of renal origin: Secondary | ICD-10-CM | POA: Diagnosis not present

## 2020-08-04 DIAGNOSIS — N186 End stage renal disease: Secondary | ICD-10-CM | POA: Diagnosis not present

## 2020-08-05 DIAGNOSIS — D631 Anemia in chronic kidney disease: Secondary | ICD-10-CM | POA: Diagnosis not present

## 2020-08-05 DIAGNOSIS — Z23 Encounter for immunization: Secondary | ICD-10-CM | POA: Diagnosis not present

## 2020-08-05 DIAGNOSIS — N2581 Secondary hyperparathyroidism of renal origin: Secondary | ICD-10-CM | POA: Diagnosis not present

## 2020-08-05 DIAGNOSIS — N186 End stage renal disease: Secondary | ICD-10-CM | POA: Diagnosis not present

## 2020-08-06 DIAGNOSIS — I10 Essential (primary) hypertension: Secondary | ICD-10-CM | POA: Diagnosis not present

## 2020-08-06 DIAGNOSIS — Z23 Encounter for immunization: Secondary | ICD-10-CM | POA: Diagnosis not present

## 2020-08-06 DIAGNOSIS — D649 Anemia, unspecified: Secondary | ICD-10-CM | POA: Diagnosis not present

## 2020-08-06 DIAGNOSIS — D631 Anemia in chronic kidney disease: Secondary | ICD-10-CM | POA: Diagnosis not present

## 2020-08-06 DIAGNOSIS — N186 End stage renal disease: Secondary | ICD-10-CM | POA: Diagnosis not present

## 2020-08-06 DIAGNOSIS — N2581 Secondary hyperparathyroidism of renal origin: Secondary | ICD-10-CM | POA: Diagnosis not present

## 2020-08-07 ENCOUNTER — Other Ambulatory Visit: Payer: Self-pay | Admitting: Cardiology

## 2020-08-07 DIAGNOSIS — Z23 Encounter for immunization: Secondary | ICD-10-CM | POA: Diagnosis not present

## 2020-08-07 DIAGNOSIS — N2581 Secondary hyperparathyroidism of renal origin: Secondary | ICD-10-CM | POA: Diagnosis not present

## 2020-08-07 DIAGNOSIS — D631 Anemia in chronic kidney disease: Secondary | ICD-10-CM | POA: Diagnosis not present

## 2020-08-07 DIAGNOSIS — N186 End stage renal disease: Secondary | ICD-10-CM | POA: Diagnosis not present

## 2020-08-08 ENCOUNTER — Ambulatory Visit (INDEPENDENT_AMBULATORY_CARE_PROVIDER_SITE_OTHER): Payer: Medicare Other | Admitting: Cardiology

## 2020-08-08 ENCOUNTER — Other Ambulatory Visit: Payer: Self-pay

## 2020-08-08 ENCOUNTER — Encounter: Payer: Self-pay | Admitting: Cardiology

## 2020-08-08 VITALS — BP 114/62 | HR 82 | Ht 64.0 in | Wt 144.0 lb

## 2020-08-08 DIAGNOSIS — I2510 Atherosclerotic heart disease of native coronary artery without angina pectoris: Secondary | ICD-10-CM

## 2020-08-08 DIAGNOSIS — I6523 Occlusion and stenosis of bilateral carotid arteries: Secondary | ICD-10-CM

## 2020-08-08 DIAGNOSIS — N186 End stage renal disease: Secondary | ICD-10-CM | POA: Diagnosis not present

## 2020-08-08 DIAGNOSIS — I10 Essential (primary) hypertension: Secondary | ICD-10-CM | POA: Diagnosis not present

## 2020-08-08 DIAGNOSIS — I5032 Chronic diastolic (congestive) heart failure: Secondary | ICD-10-CM | POA: Diagnosis not present

## 2020-08-08 DIAGNOSIS — N2581 Secondary hyperparathyroidism of renal origin: Secondary | ICD-10-CM | POA: Diagnosis not present

## 2020-08-08 DIAGNOSIS — N185 Chronic kidney disease, stage 5: Secondary | ICD-10-CM

## 2020-08-08 DIAGNOSIS — Z23 Encounter for immunization: Secondary | ICD-10-CM | POA: Diagnosis not present

## 2020-08-08 DIAGNOSIS — I350 Nonrheumatic aortic (valve) stenosis: Secondary | ICD-10-CM

## 2020-08-08 DIAGNOSIS — R600 Localized edema: Secondary | ICD-10-CM | POA: Diagnosis not present

## 2020-08-08 DIAGNOSIS — D631 Anemia in chronic kidney disease: Secondary | ICD-10-CM | POA: Diagnosis not present

## 2020-08-08 DIAGNOSIS — E7800 Pure hypercholesterolemia, unspecified: Secondary | ICD-10-CM | POA: Diagnosis not present

## 2020-08-08 NOTE — Patient Instructions (Addendum)
Medication Instructions:  Your physician recommends that you continue on your current medications as directed. Please refer to the Current Medication list given to you today.  *If you need a refill on your cardiac medications before your next appointment, please call your pharmacy   Follow-Up: At [facility], you and your health needs are our priority.  As part of our continuing mission to provide you with exceptional heart care, we have created designated Provider Care Teams.  These Care Teams include your primary Cardiologist (physician) and Advanced Practice Providers (APPs -  Physician Assistants and Nurse Practitioners) who all work together to provide you with the care you need, when you need it.   Your next appointment:   1 year  The format for your next appointment:   In-Person  Provider:   You may see Traci Turner, MD or one of the following Advanced Practice Providers on your designated Care Team:   Dayna Dunn, PA-C Michele Lenze, PA-C    

## 2020-08-08 NOTE — Progress Notes (Signed)
Date:  05/12/1759   ID:  Wendy Walters, DOB [birth year], MRN [id number]   PCP:  Kathyrn Lass, MD  Cardiologist:  Fransico Him, MD  Electrophysiologist:  None   Chief Complaint:  CAD, HTN, HLD  History of Present Illness:     Wendy Springeris a 72J.o.femalewith a hx of ASCAD s/p PCI of LAD 2001 in setting of AWMI and cath 2005 with patent stent to the LAD, dyslipidemia, carotid artery stenosis, chronic diastolic CHF and HTN.  2D echo showed low normal LVF with EF 50-09% with diastolic dysfunction.  She has CKD and is followed by nephrology and tells me she will be on HD within the next 6 weeks.  She had an AVF placed and was on HD but changed to peritoneal dialysis.   She is here today for followup and is doing well.  SHe has chronic LE edema and DOE that are stable. She denies any chest pain or pressure,  PND, orthopnea, LE edema, dizziness, palpitations or syncope. She is compliant with her meds and is tolerating meds with no SE.    Prior CV studies:   The following studies were reviewed today:  Labs  [facility] 08/2019 Study Highlights    The left ventricular ejection fraction is mildly decreased (45-54%).  Nuclear stress EF: 47%.  There was no ST segment deviation noted during stress.  The study is normal.  This is a low risk study.   Normal pharmacologic nuclear stress test with no evidence for prior infarct or ischemia. Mildly decreased LVEF, correlation with an echocardiogram is recommended.  2D echo 04/2020 IMPRESSIONS    1. Left ventricular ejection fraction, by visual estimation, is 50 to  55%. The left ventricle has hyperdynamic function. There is no left  ventricular hypertrophy.  2. Left ventricular diastolic parameters are consistent with Grade I  diastolic dysfunction (impaired relaxation).  3. The left ventricle has no regional wall motion abnormalities.  4. Global right ventricle has normal systolic function.The right  ventricular  size is normal. No increase in right ventricular wall  thickness.  5. Left atrial size was mildly dilated.  6. Right atrial size was normal.  7. Large pleural effusion in the left lateral region.  8. Small pericardial effusion.  9. The pericardial effusion is circumferential.  10. Moderate mitral annular calcification.  11. The mitral valve is abnormal. Mild mitral valve regurgitation.  12. The tricuspid valve is grossly normal. Tricuspid valve regurgitation  is trivial.  13. Aortic valve area, by VTI measures 1.47 cm.  14. Aortic valve mean gradient measures 11.0 mmHg.  15. Aortic valve peak gradient measures 19.7 mmHg.  16. The aortic valve is tricuspid. Aortic valve regurgitation is not  visualized. Mild to moderate aortic valve stenosis.  17. The pulmonic valve was grossly normal. Pulmonic valve regurgitation is  not visualized.  18. Moderately elevated pulmonary artery systolic pressure.  19. The inferior vena cava is normal in size with <50% respiratory  variability, suggesting right atrial pressure of 8 mmHg.  20. Evidence of atrial level shunting detected by color flow Doppler.  21. Small patent foramen ovale with predominantly right to left shunting  across the atrial septum.     Past Medical History:  Diagnosis Date  . Anemia   . Anemia of chronic disease 02/04/2017  . Bradycardia 08/22/2015  . Carotid stenosis    1-39% stenosis by dopplers 05/2020  . Chronic kidney disease    stage 3  . Coronary artery disease  s/p PCI 2001 of lad in setting of AWMI-rotational atherectomy with BMS -Dr Radford Pax  . Edema extremities 08/21/2016  . ESRD (end stage renal disease) ([place])    on peritoneal dialysis  . Fibrillary glomerulonephritis    w nephrotic range protenuria, following w neurology, managed with an ACE-I and ARB  . H/O Doppler ultrasound 01/2003   ,39% bilateral carotid stenosis.  . Hyperlipidemia   . Hypertension   . Hypothyroidism   . Osteoporosis    s/p  actonel for about 7-8 years   Past Surgical History:  Procedure Laterality Date  . ANGIOPLASTY  2001  . CARDIAC CATHETERIZATION  2005   patent LAD stent  . CHOLECYSTECTOMY N/A 02/07/2017   Procedure: LAPAROSCOPIC CHOLECYSTECTOMY WITH INTRAOPERATIVE CHOLANGIOGRAM;  Surgeon: Armandina Gemma, MD;  Location: [facility];  Service: General;  Laterality: N/A;  . EYE SURGERY Bilateral 2012   Cataract  . TONSILLECTOMY  1942     Current Meds  Medication Sig  . Apoaequorin (PREVAGEN PO) Take by mouth.  Marland Kitchen aspirin 325 MG tablet Take 325 mg by mouth daily.  Marland Kitchen atorvastatin (LIPITOR) 80 MG tablet TAKE 1 TABLET DAILY AT 6 PM.  . clonazePAM (KLONOPIN) 0.5 MG tablet Take 0.25 mg by mouth at bedtime.  Marland Kitchen ezetimibe (ZETIA) 10 MG tablet TAKE 1 TABLET EVERY DAY  . ferrous sulfate 325 (65 FE) MG tablet Take 325 mg by mouth daily with breakfast.  . FIBER PO Take by mouth.  . hydrALAZINE (APRESOLINE) 25 MG tablet   . levothyroxine (SYNTHROID, LEVOTHROID) 88 MCG tablet Take 88 mcg by mouth daily before breakfast.  . Multiple Vitamin (MULTIVITAMIN) tablet Take 1 tablet by mouth daily.  . niacin 500 MG tablet Take 500 mg by mouth 2 (two) times daily with a meal.  . Omega-3 Fatty Acids (OMEGA 3 PO) Take 1 capsule by mouth 2 (two) times daily.  . Probiotic Product (PROBIOTIC PO) Take 1 capsule by mouth every 3 (three) days.  Marland Kitchen torsemide (DEMADEX) 20 MG tablet Take 20 mg by mouth 2 (two) times daily.  Marland Kitchen VITAMIN D PO Take by mouth.     Allergies:   Codeine, Other, Adhesive [tape], and Wound dressing adhesive   Social History   Tobacco Use  . Smoking status: Former Smoker    Quit date: 06/08/1970    Years since quitting: 50.2  . Smokeless tobacco: Never Used  . Tobacco comment: quit in 1972  Vaping Use  . Vaping Use: Never used  Substance Use Topics  . Alcohol use: No    Comment: quit in her 40s  . Drug use: No     Family Hx: The patient's family history includes Cancer in her father; Cancer - Prostate in her  father; Diabetes in her daughter; Hypertension in her daughter and mother.  ROS:   Please see the history of present illness.     All other systems reviewed and are negative.   Labs/Other Tests and Data Reviewed:    Recent Labs: No results found for requested labs within last 8760 hours.   Recent Lipid Panel Lab Results  Component Value Date/Time   CHOL 127 05/18/2019 10:03 AM   TRIG 68 05/18/2019 10:03 AM   HDL 74 05/18/2019 10:03 AM   CHOLHDL 1.7 05/18/2019 10:03 AM   CHOLHDL 2.4 04/13/2016 09:35 AM   LDLCALC 39 05/18/2019 10:03 AM    Wt Readings from Last 3 Encounters:  08/08/20 144 lb (65.3 kg)  01/07/20 140 lb 3.2 oz (63.6 kg)  09/03/19  140 lb (63.5 kg)    EKG was performed today and showed NSR with PACs and anterior and inferior infarcts with no ST changes  Objective:    Vital Signs:  BP 114/62   Pulse 82   Ht 5\' 4"  (1.626 m)   Wt 144 lb (65.3 kg)   BMI 24.72 kg/m    GEN: Well nourished, well developed in no acute distress HEENT: Normal NECK: No JVD; No carotid bruits LYMPHATICS: No lymphadenopathy CARDIAC:RRR, no murmurs, rubs, gallops RESPIRATORY:  Clear to auscultation without rales, wheezing or rhonchi  ABDOMEN: Soft, non-tender, non-distended MUSCULOSKELETAL:  2+ pitting  edema; No deformity  SKIN: Warm and dry NEUROLOGIC:  Alert and oriented x 3 PSYCHIATRIC:  Normal affect    ASSESSMENT & PLAN:    1.  ASCAD -s/p PCI of LAD 2001 in setting of AWMI and cath 2005 with patent stent to the LAD. -she has not had any anginal symptoms since I saw her last -nuclear stress test 08/2019 showed no ischemia -continue ASA and statin.  2.  HTN -BP controlled on exam today -BP has significantly dropped after starting dialysis -her nephrologist is managing her HTN  3.  Bilateral carotid artery stenosis -dopplers 05/2018 showed 1-39% bilateral stenosis.  -repeat doppler stable on 05/2020 -continue statin and ASA  4.  HLD -LDL goal < 70 -continue  Atorvastatin 80mg  daily and Zetia 10mg  daily -LDL was 33 in Nov 2021  5.  Chronic LE edema -managed with volume removal per nephrology with peritoneal dialysis and Torsemide 20mg  BID -did not tolerate compression hose -encouraged to keep legs elevated when sitting  6.  ESRD -followed by nephrologist in [place] -on peritoneal dialsyis  7.  Chronic diastolic CHF -appears euvolemic on exam today -she has chronic SOB and LE edema which are  at baseline -volume managed with peritoneal dialysis  8.  Aortic stenosis -very mild with AVG 14mmHg by echo 04/2020    Medication Adjustments/Labs and Tests Ordered: Current medicines are reviewed at length with the patient today.  Concerns regarding medicines are outlined above.  Tests Ordered: No orders of the defined types were placed in this encounter.  Medication Changes: No orders of the defined types were placed in this encounter.   Disposition:  Follow up in 6 month(s)  Signed, Fransico Him, MD  08/08/2020 3:51 PM    [facility]

## 2020-08-09 DIAGNOSIS — N186 End stage renal disease: Secondary | ICD-10-CM | POA: Diagnosis not present

## 2020-08-09 DIAGNOSIS — N2581 Secondary hyperparathyroidism of renal origin: Secondary | ICD-10-CM | POA: Diagnosis not present

## 2020-08-09 DIAGNOSIS — D631 Anemia in chronic kidney disease: Secondary | ICD-10-CM | POA: Diagnosis not present

## 2020-08-09 DIAGNOSIS — Z23 Encounter for immunization: Secondary | ICD-10-CM | POA: Diagnosis not present

## 2020-08-10 DIAGNOSIS — N186 End stage renal disease: Secondary | ICD-10-CM | POA: Diagnosis not present

## 2020-08-10 DIAGNOSIS — N2581 Secondary hyperparathyroidism of renal origin: Secondary | ICD-10-CM | POA: Diagnosis not present

## 2020-08-10 DIAGNOSIS — D631 Anemia in chronic kidney disease: Secondary | ICD-10-CM | POA: Diagnosis not present

## 2020-08-10 DIAGNOSIS — Z23 Encounter for immunization: Secondary | ICD-10-CM | POA: Diagnosis not present

## 2020-08-11 DIAGNOSIS — Z23 Encounter for immunization: Secondary | ICD-10-CM | POA: Diagnosis not present

## 2020-08-11 DIAGNOSIS — N2581 Secondary hyperparathyroidism of renal origin: Secondary | ICD-10-CM | POA: Diagnosis not present

## 2020-08-11 DIAGNOSIS — D631 Anemia in chronic kidney disease: Secondary | ICD-10-CM | POA: Diagnosis not present

## 2020-08-11 DIAGNOSIS — N186 End stage renal disease: Secondary | ICD-10-CM | POA: Diagnosis not present

## 2020-08-11 NOTE — Addendum Note (Signed)
Addended by: Patterson Hammersmith A on: 08/11/2020 11:45 AM   Modules accepted: Orders

## 2020-08-12 DIAGNOSIS — N184 Chronic kidney disease, stage 4 (severe): Secondary | ICD-10-CM | POA: Diagnosis not present

## 2020-08-12 DIAGNOSIS — Z23 Encounter for immunization: Secondary | ICD-10-CM | POA: Diagnosis not present

## 2020-08-12 DIAGNOSIS — M8000XD Age-related osteoporosis with current pathological fracture, unspecified site, subsequent encounter for fracture with routine healing: Secondary | ICD-10-CM | POA: Diagnosis not present

## 2020-08-12 DIAGNOSIS — N2581 Secondary hyperparathyroidism of renal origin: Secondary | ICD-10-CM | POA: Diagnosis not present

## 2020-08-12 DIAGNOSIS — D631 Anemia in chronic kidney disease: Secondary | ICD-10-CM | POA: Diagnosis not present

## 2020-08-12 DIAGNOSIS — Z7189 Other specified counseling: Secondary | ICD-10-CM | POA: Diagnosis not present

## 2020-08-12 DIAGNOSIS — N186 End stage renal disease: Secondary | ICD-10-CM | POA: Diagnosis not present

## 2020-08-13 DIAGNOSIS — D631 Anemia in chronic kidney disease: Secondary | ICD-10-CM | POA: Diagnosis not present

## 2020-08-13 DIAGNOSIS — Z23 Encounter for immunization: Secondary | ICD-10-CM | POA: Diagnosis not present

## 2020-08-13 DIAGNOSIS — N2581 Secondary hyperparathyroidism of renal origin: Secondary | ICD-10-CM | POA: Diagnosis not present

## 2020-08-13 DIAGNOSIS — N186 End stage renal disease: Secondary | ICD-10-CM | POA: Diagnosis not present

## 2020-08-14 DIAGNOSIS — Z23 Encounter for immunization: Secondary | ICD-10-CM | POA: Diagnosis not present

## 2020-08-14 DIAGNOSIS — N186 End stage renal disease: Secondary | ICD-10-CM | POA: Diagnosis not present

## 2020-08-14 DIAGNOSIS — D631 Anemia in chronic kidney disease: Secondary | ICD-10-CM | POA: Diagnosis not present

## 2020-08-14 DIAGNOSIS — N2581 Secondary hyperparathyroidism of renal origin: Secondary | ICD-10-CM | POA: Diagnosis not present

## 2020-08-15 DIAGNOSIS — N186 End stage renal disease: Secondary | ICD-10-CM | POA: Diagnosis not present

## 2020-08-15 DIAGNOSIS — Z23 Encounter for immunization: Secondary | ICD-10-CM | POA: Diagnosis not present

## 2020-08-15 DIAGNOSIS — N2581 Secondary hyperparathyroidism of renal origin: Secondary | ICD-10-CM | POA: Diagnosis not present

## 2020-08-15 DIAGNOSIS — D631 Anemia in chronic kidney disease: Secondary | ICD-10-CM | POA: Diagnosis not present

## 2020-08-16 DIAGNOSIS — N186 End stage renal disease: Secondary | ICD-10-CM | POA: Diagnosis not present

## 2020-08-16 DIAGNOSIS — Z23 Encounter for immunization: Secondary | ICD-10-CM | POA: Diagnosis not present

## 2020-08-16 DIAGNOSIS — D631 Anemia in chronic kidney disease: Secondary | ICD-10-CM | POA: Diagnosis not present

## 2020-08-16 DIAGNOSIS — N2581 Secondary hyperparathyroidism of renal origin: Secondary | ICD-10-CM | POA: Diagnosis not present

## 2020-08-17 DIAGNOSIS — N2581 Secondary hyperparathyroidism of renal origin: Secondary | ICD-10-CM | POA: Diagnosis not present

## 2020-08-17 DIAGNOSIS — D631 Anemia in chronic kidney disease: Secondary | ICD-10-CM | POA: Diagnosis not present

## 2020-08-17 DIAGNOSIS — N186 End stage renal disease: Secondary | ICD-10-CM | POA: Diagnosis not present

## 2020-08-17 DIAGNOSIS — Z23 Encounter for immunization: Secondary | ICD-10-CM | POA: Diagnosis not present

## 2020-08-18 DIAGNOSIS — N186 End stage renal disease: Secondary | ICD-10-CM | POA: Diagnosis not present

## 2020-08-18 DIAGNOSIS — N2581 Secondary hyperparathyroidism of renal origin: Secondary | ICD-10-CM | POA: Diagnosis not present

## 2020-08-18 DIAGNOSIS — D631 Anemia in chronic kidney disease: Secondary | ICD-10-CM | POA: Diagnosis not present

## 2020-08-18 DIAGNOSIS — Z23 Encounter for immunization: Secondary | ICD-10-CM | POA: Diagnosis not present

## 2020-08-19 DIAGNOSIS — N2581 Secondary hyperparathyroidism of renal origin: Secondary | ICD-10-CM | POA: Diagnosis not present

## 2020-08-19 DIAGNOSIS — Z23 Encounter for immunization: Secondary | ICD-10-CM | POA: Diagnosis not present

## 2020-08-19 DIAGNOSIS — D631 Anemia in chronic kidney disease: Secondary | ICD-10-CM | POA: Diagnosis not present

## 2020-08-19 DIAGNOSIS — N186 End stage renal disease: Secondary | ICD-10-CM | POA: Diagnosis not present

## 2020-08-20 DIAGNOSIS — N2581 Secondary hyperparathyroidism of renal origin: Secondary | ICD-10-CM | POA: Diagnosis not present

## 2020-08-20 DIAGNOSIS — N186 End stage renal disease: Secondary | ICD-10-CM | POA: Diagnosis not present

## 2020-08-20 DIAGNOSIS — Z23 Encounter for immunization: Secondary | ICD-10-CM | POA: Diagnosis not present

## 2020-08-20 DIAGNOSIS — D631 Anemia in chronic kidney disease: Secondary | ICD-10-CM | POA: Diagnosis not present

## 2020-08-21 DIAGNOSIS — N186 End stage renal disease: Secondary | ICD-10-CM | POA: Diagnosis not present

## 2020-08-21 DIAGNOSIS — D631 Anemia in chronic kidney disease: Secondary | ICD-10-CM | POA: Diagnosis not present

## 2020-08-21 DIAGNOSIS — Z23 Encounter for immunization: Secondary | ICD-10-CM | POA: Diagnosis not present

## 2020-08-21 DIAGNOSIS — N2581 Secondary hyperparathyroidism of renal origin: Secondary | ICD-10-CM | POA: Diagnosis not present

## 2020-08-22 DIAGNOSIS — H5211 Myopia, right eye: Secondary | ICD-10-CM | POA: Diagnosis not present

## 2020-08-22 DIAGNOSIS — H35373 Puckering of macula, bilateral: Secondary | ICD-10-CM | POA: Diagnosis not present

## 2020-08-22 DIAGNOSIS — D3131 Benign neoplasm of right choroid: Secondary | ICD-10-CM | POA: Diagnosis not present

## 2020-08-22 DIAGNOSIS — H52221 Regular astigmatism, right eye: Secondary | ICD-10-CM | POA: Diagnosis not present

## 2020-08-22 DIAGNOSIS — N2581 Secondary hyperparathyroidism of renal origin: Secondary | ICD-10-CM | POA: Diagnosis not present

## 2020-08-22 DIAGNOSIS — H26493 Other secondary cataract, bilateral: Secondary | ICD-10-CM | POA: Diagnosis not present

## 2020-08-22 DIAGNOSIS — H524 Presbyopia: Secondary | ICD-10-CM | POA: Diagnosis not present

## 2020-08-22 DIAGNOSIS — D631 Anemia in chronic kidney disease: Secondary | ICD-10-CM | POA: Diagnosis not present

## 2020-08-22 DIAGNOSIS — N186 End stage renal disease: Secondary | ICD-10-CM | POA: Diagnosis not present

## 2020-08-22 DIAGNOSIS — Z23 Encounter for immunization: Secondary | ICD-10-CM | POA: Diagnosis not present

## 2020-08-22 DIAGNOSIS — H52222 Regular astigmatism, left eye: Secondary | ICD-10-CM | POA: Diagnosis not present

## 2020-08-23 DIAGNOSIS — D631 Anemia in chronic kidney disease: Secondary | ICD-10-CM | POA: Diagnosis not present

## 2020-08-23 DIAGNOSIS — N2581 Secondary hyperparathyroidism of renal origin: Secondary | ICD-10-CM | POA: Diagnosis not present

## 2020-08-23 DIAGNOSIS — Z23 Encounter for immunization: Secondary | ICD-10-CM | POA: Diagnosis not present

## 2020-08-23 DIAGNOSIS — N186 End stage renal disease: Secondary | ICD-10-CM | POA: Diagnosis not present

## 2020-08-24 DIAGNOSIS — N2581 Secondary hyperparathyroidism of renal origin: Secondary | ICD-10-CM | POA: Diagnosis not present

## 2020-08-24 DIAGNOSIS — D631 Anemia in chronic kidney disease: Secondary | ICD-10-CM | POA: Diagnosis not present

## 2020-08-24 DIAGNOSIS — Z23 Encounter for immunization: Secondary | ICD-10-CM | POA: Diagnosis not present

## 2020-08-24 DIAGNOSIS — N186 End stage renal disease: Secondary | ICD-10-CM | POA: Diagnosis not present

## 2020-08-25 DIAGNOSIS — Z23 Encounter for immunization: Secondary | ICD-10-CM | POA: Diagnosis not present

## 2020-08-25 DIAGNOSIS — N186 End stage renal disease: Secondary | ICD-10-CM | POA: Diagnosis not present

## 2020-08-25 DIAGNOSIS — D631 Anemia in chronic kidney disease: Secondary | ICD-10-CM | POA: Diagnosis not present

## 2020-08-25 DIAGNOSIS — N2581 Secondary hyperparathyroidism of renal origin: Secondary | ICD-10-CM | POA: Diagnosis not present

## 2020-08-26 DIAGNOSIS — D631 Anemia in chronic kidney disease: Secondary | ICD-10-CM | POA: Diagnosis not present

## 2020-08-26 DIAGNOSIS — N2581 Secondary hyperparathyroidism of renal origin: Secondary | ICD-10-CM | POA: Diagnosis not present

## 2020-08-26 DIAGNOSIS — N186 End stage renal disease: Secondary | ICD-10-CM | POA: Diagnosis not present

## 2020-08-26 DIAGNOSIS — Z23 Encounter for immunization: Secondary | ICD-10-CM | POA: Diagnosis not present

## 2020-08-27 DIAGNOSIS — N186 End stage renal disease: Secondary | ICD-10-CM | POA: Diagnosis not present

## 2020-08-27 DIAGNOSIS — N2581 Secondary hyperparathyroidism of renal origin: Secondary | ICD-10-CM | POA: Diagnosis not present

## 2020-08-27 DIAGNOSIS — Z23 Encounter for immunization: Secondary | ICD-10-CM | POA: Diagnosis not present

## 2020-08-27 DIAGNOSIS — D631 Anemia in chronic kidney disease: Secondary | ICD-10-CM | POA: Diagnosis not present

## 2020-08-28 DIAGNOSIS — D631 Anemia in chronic kidney disease: Secondary | ICD-10-CM | POA: Diagnosis not present

## 2020-08-28 DIAGNOSIS — N186 End stage renal disease: Secondary | ICD-10-CM | POA: Diagnosis not present

## 2020-08-28 DIAGNOSIS — Z23 Encounter for immunization: Secondary | ICD-10-CM | POA: Diagnosis not present

## 2020-08-28 DIAGNOSIS — N2581 Secondary hyperparathyroidism of renal origin: Secondary | ICD-10-CM | POA: Diagnosis not present

## 2020-08-29 DIAGNOSIS — Z23 Encounter for immunization: Secondary | ICD-10-CM | POA: Diagnosis not present

## 2020-08-29 DIAGNOSIS — D631 Anemia in chronic kidney disease: Secondary | ICD-10-CM | POA: Diagnosis not present

## 2020-08-29 DIAGNOSIS — N186 End stage renal disease: Secondary | ICD-10-CM | POA: Diagnosis not present

## 2020-08-29 DIAGNOSIS — N2581 Secondary hyperparathyroidism of renal origin: Secondary | ICD-10-CM | POA: Diagnosis not present

## 2020-08-30 DIAGNOSIS — D631 Anemia in chronic kidney disease: Secondary | ICD-10-CM | POA: Diagnosis not present

## 2020-08-30 DIAGNOSIS — N186 End stage renal disease: Secondary | ICD-10-CM | POA: Diagnosis not present

## 2020-08-30 DIAGNOSIS — N2581 Secondary hyperparathyroidism of renal origin: Secondary | ICD-10-CM | POA: Diagnosis not present

## 2020-08-30 DIAGNOSIS — Z23 Encounter for immunization: Secondary | ICD-10-CM | POA: Diagnosis not present

## 2020-08-31 DIAGNOSIS — N186 End stage renal disease: Secondary | ICD-10-CM | POA: Diagnosis not present

## 2020-08-31 DIAGNOSIS — D631 Anemia in chronic kidney disease: Secondary | ICD-10-CM | POA: Diagnosis not present

## 2020-08-31 DIAGNOSIS — Z23 Encounter for immunization: Secondary | ICD-10-CM | POA: Diagnosis not present

## 2020-08-31 DIAGNOSIS — N2581 Secondary hyperparathyroidism of renal origin: Secondary | ICD-10-CM | POA: Diagnosis not present

## 2020-09-01 DIAGNOSIS — N2581 Secondary hyperparathyroidism of renal origin: Secondary | ICD-10-CM | POA: Diagnosis not present

## 2020-09-01 DIAGNOSIS — Z23 Encounter for immunization: Secondary | ICD-10-CM | POA: Diagnosis not present

## 2020-09-01 DIAGNOSIS — N186 End stage renal disease: Secondary | ICD-10-CM | POA: Diagnosis not present

## 2020-09-01 DIAGNOSIS — D631 Anemia in chronic kidney disease: Secondary | ICD-10-CM | POA: Diagnosis not present

## 2020-09-02 DIAGNOSIS — D631 Anemia in chronic kidney disease: Secondary | ICD-10-CM | POA: Diagnosis not present

## 2020-09-02 DIAGNOSIS — Z23 Encounter for immunization: Secondary | ICD-10-CM | POA: Diagnosis not present

## 2020-09-02 DIAGNOSIS — N186 End stage renal disease: Secondary | ICD-10-CM | POA: Diagnosis not present

## 2020-09-02 DIAGNOSIS — N2581 Secondary hyperparathyroidism of renal origin: Secondary | ICD-10-CM | POA: Diagnosis not present

## 2020-09-03 DIAGNOSIS — N2581 Secondary hyperparathyroidism of renal origin: Secondary | ICD-10-CM | POA: Diagnosis not present

## 2020-09-03 DIAGNOSIS — Z23 Encounter for immunization: Secondary | ICD-10-CM | POA: Diagnosis not present

## 2020-09-03 DIAGNOSIS — D631 Anemia in chronic kidney disease: Secondary | ICD-10-CM | POA: Diagnosis not present

## 2020-09-03 DIAGNOSIS — N186 End stage renal disease: Secondary | ICD-10-CM | POA: Diagnosis not present

## 2020-09-04 DIAGNOSIS — N2581 Secondary hyperparathyroidism of renal origin: Secondary | ICD-10-CM | POA: Diagnosis not present

## 2020-09-04 DIAGNOSIS — I10 Essential (primary) hypertension: Secondary | ICD-10-CM | POA: Diagnosis not present

## 2020-09-04 DIAGNOSIS — D631 Anemia in chronic kidney disease: Secondary | ICD-10-CM | POA: Diagnosis not present

## 2020-09-04 DIAGNOSIS — N186 End stage renal disease: Secondary | ICD-10-CM | POA: Diagnosis not present

## 2020-09-05 DIAGNOSIS — D631 Anemia in chronic kidney disease: Secondary | ICD-10-CM | POA: Diagnosis not present

## 2020-09-05 DIAGNOSIS — N186 End stage renal disease: Secondary | ICD-10-CM | POA: Diagnosis not present

## 2020-09-05 DIAGNOSIS — N2581 Secondary hyperparathyroidism of renal origin: Secondary | ICD-10-CM | POA: Diagnosis not present

## 2020-09-06 DIAGNOSIS — D631 Anemia in chronic kidney disease: Secondary | ICD-10-CM | POA: Diagnosis not present

## 2020-09-06 DIAGNOSIS — N186 End stage renal disease: Secondary | ICD-10-CM | POA: Diagnosis not present

## 2020-09-06 DIAGNOSIS — N2581 Secondary hyperparathyroidism of renal origin: Secondary | ICD-10-CM | POA: Diagnosis not present

## 2020-09-07 DIAGNOSIS — N186 End stage renal disease: Secondary | ICD-10-CM | POA: Diagnosis not present

## 2020-09-07 DIAGNOSIS — D631 Anemia in chronic kidney disease: Secondary | ICD-10-CM | POA: Diagnosis not present

## 2020-09-07 DIAGNOSIS — N2581 Secondary hyperparathyroidism of renal origin: Secondary | ICD-10-CM | POA: Diagnosis not present

## 2020-09-08 DIAGNOSIS — N2581 Secondary hyperparathyroidism of renal origin: Secondary | ICD-10-CM | POA: Diagnosis not present

## 2020-09-08 DIAGNOSIS — N186 End stage renal disease: Secondary | ICD-10-CM | POA: Diagnosis not present

## 2020-09-08 DIAGNOSIS — D631 Anemia in chronic kidney disease: Secondary | ICD-10-CM | POA: Diagnosis not present

## 2020-09-09 DIAGNOSIS — N186 End stage renal disease: Secondary | ICD-10-CM | POA: Diagnosis not present

## 2020-09-09 DIAGNOSIS — N2581 Secondary hyperparathyroidism of renal origin: Secondary | ICD-10-CM | POA: Diagnosis not present

## 2020-09-09 DIAGNOSIS — D631 Anemia in chronic kidney disease: Secondary | ICD-10-CM | POA: Diagnosis not present

## 2020-09-10 DIAGNOSIS — D631 Anemia in chronic kidney disease: Secondary | ICD-10-CM | POA: Diagnosis not present

## 2020-09-10 DIAGNOSIS — N186 End stage renal disease: Secondary | ICD-10-CM | POA: Diagnosis not present

## 2020-09-10 DIAGNOSIS — N2581 Secondary hyperparathyroidism of renal origin: Secondary | ICD-10-CM | POA: Diagnosis not present

## 2020-09-11 DIAGNOSIS — N186 End stage renal disease: Secondary | ICD-10-CM | POA: Diagnosis not present

## 2020-09-11 DIAGNOSIS — N2581 Secondary hyperparathyroidism of renal origin: Secondary | ICD-10-CM | POA: Diagnosis not present

## 2020-09-11 DIAGNOSIS — D631 Anemia in chronic kidney disease: Secondary | ICD-10-CM | POA: Diagnosis not present

## 2020-09-12 DIAGNOSIS — N2581 Secondary hyperparathyroidism of renal origin: Secondary | ICD-10-CM | POA: Diagnosis not present

## 2020-09-12 DIAGNOSIS — N186 End stage renal disease: Secondary | ICD-10-CM | POA: Diagnosis not present

## 2020-09-12 DIAGNOSIS — D631 Anemia in chronic kidney disease: Secondary | ICD-10-CM | POA: Diagnosis not present

## 2020-09-13 DIAGNOSIS — N2581 Secondary hyperparathyroidism of renal origin: Secondary | ICD-10-CM | POA: Diagnosis not present

## 2020-09-13 DIAGNOSIS — D631 Anemia in chronic kidney disease: Secondary | ICD-10-CM | POA: Diagnosis not present

## 2020-09-13 DIAGNOSIS — N186 End stage renal disease: Secondary | ICD-10-CM | POA: Diagnosis not present

## 2020-09-14 DIAGNOSIS — N186 End stage renal disease: Secondary | ICD-10-CM | POA: Diagnosis not present

## 2020-09-14 DIAGNOSIS — N2581 Secondary hyperparathyroidism of renal origin: Secondary | ICD-10-CM | POA: Diagnosis not present

## 2020-09-14 DIAGNOSIS — D631 Anemia in chronic kidney disease: Secondary | ICD-10-CM | POA: Diagnosis not present

## 2020-09-15 DIAGNOSIS — N184 Chronic kidney disease, stage 4 (severe): Secondary | ICD-10-CM | POA: Diagnosis not present

## 2020-09-15 DIAGNOSIS — N2581 Secondary hyperparathyroidism of renal origin: Secondary | ICD-10-CM | POA: Diagnosis not present

## 2020-09-15 DIAGNOSIS — Z7189 Other specified counseling: Secondary | ICD-10-CM | POA: Diagnosis not present

## 2020-09-15 DIAGNOSIS — D631 Anemia in chronic kidney disease: Secondary | ICD-10-CM | POA: Diagnosis not present

## 2020-09-15 DIAGNOSIS — N186 End stage renal disease: Secondary | ICD-10-CM | POA: Diagnosis not present

## 2020-09-15 DIAGNOSIS — M8000XD Age-related osteoporosis with current pathological fracture, unspecified site, subsequent encounter for fracture with routine healing: Secondary | ICD-10-CM | POA: Diagnosis not present

## 2020-09-16 DIAGNOSIS — N186 End stage renal disease: Secondary | ICD-10-CM | POA: Diagnosis not present

## 2020-09-16 DIAGNOSIS — N2581 Secondary hyperparathyroidism of renal origin: Secondary | ICD-10-CM | POA: Diagnosis not present

## 2020-09-16 DIAGNOSIS — D631 Anemia in chronic kidney disease: Secondary | ICD-10-CM | POA: Diagnosis not present

## 2020-09-17 DIAGNOSIS — N2581 Secondary hyperparathyroidism of renal origin: Secondary | ICD-10-CM | POA: Diagnosis not present

## 2020-09-17 DIAGNOSIS — N186 End stage renal disease: Secondary | ICD-10-CM | POA: Diagnosis not present

## 2020-09-17 DIAGNOSIS — D631 Anemia in chronic kidney disease: Secondary | ICD-10-CM | POA: Diagnosis not present

## 2020-09-18 DIAGNOSIS — N186 End stage renal disease: Secondary | ICD-10-CM | POA: Diagnosis not present

## 2020-09-18 DIAGNOSIS — D631 Anemia in chronic kidney disease: Secondary | ICD-10-CM | POA: Diagnosis not present

## 2020-09-18 DIAGNOSIS — N2581 Secondary hyperparathyroidism of renal origin: Secondary | ICD-10-CM | POA: Diagnosis not present

## 2020-09-19 DIAGNOSIS — N186 End stage renal disease: Secondary | ICD-10-CM | POA: Diagnosis not present

## 2020-09-19 DIAGNOSIS — N2581 Secondary hyperparathyroidism of renal origin: Secondary | ICD-10-CM | POA: Diagnosis not present

## 2020-09-19 DIAGNOSIS — D631 Anemia in chronic kidney disease: Secondary | ICD-10-CM | POA: Diagnosis not present

## 2020-09-20 DIAGNOSIS — N2581 Secondary hyperparathyroidism of renal origin: Secondary | ICD-10-CM | POA: Diagnosis not present

## 2020-09-20 DIAGNOSIS — D631 Anemia in chronic kidney disease: Secondary | ICD-10-CM | POA: Diagnosis not present

## 2020-09-20 DIAGNOSIS — N186 End stage renal disease: Secondary | ICD-10-CM | POA: Diagnosis not present

## 2020-09-21 DIAGNOSIS — N186 End stage renal disease: Secondary | ICD-10-CM | POA: Diagnosis not present

## 2020-09-21 DIAGNOSIS — D631 Anemia in chronic kidney disease: Secondary | ICD-10-CM | POA: Diagnosis not present

## 2020-09-21 DIAGNOSIS — N2581 Secondary hyperparathyroidism of renal origin: Secondary | ICD-10-CM | POA: Diagnosis not present

## 2020-09-22 DIAGNOSIS — D631 Anemia in chronic kidney disease: Secondary | ICD-10-CM | POA: Diagnosis not present

## 2020-09-22 DIAGNOSIS — N2581 Secondary hyperparathyroidism of renal origin: Secondary | ICD-10-CM | POA: Diagnosis not present

## 2020-09-22 DIAGNOSIS — N186 End stage renal disease: Secondary | ICD-10-CM | POA: Diagnosis not present

## 2020-09-23 DIAGNOSIS — N2581 Secondary hyperparathyroidism of renal origin: Secondary | ICD-10-CM | POA: Diagnosis not present

## 2020-09-23 DIAGNOSIS — N186 End stage renal disease: Secondary | ICD-10-CM | POA: Diagnosis not present

## 2020-09-23 DIAGNOSIS — D631 Anemia in chronic kidney disease: Secondary | ICD-10-CM | POA: Diagnosis not present

## 2020-09-24 DIAGNOSIS — N186 End stage renal disease: Secondary | ICD-10-CM | POA: Diagnosis not present

## 2020-09-24 DIAGNOSIS — D631 Anemia in chronic kidney disease: Secondary | ICD-10-CM | POA: Diagnosis not present

## 2020-09-24 DIAGNOSIS — N2581 Secondary hyperparathyroidism of renal origin: Secondary | ICD-10-CM | POA: Diagnosis not present

## 2020-09-25 DIAGNOSIS — N186 End stage renal disease: Secondary | ICD-10-CM | POA: Diagnosis not present

## 2020-09-25 DIAGNOSIS — D631 Anemia in chronic kidney disease: Secondary | ICD-10-CM | POA: Diagnosis not present

## 2020-09-25 DIAGNOSIS — N2581 Secondary hyperparathyroidism of renal origin: Secondary | ICD-10-CM | POA: Diagnosis not present

## 2020-09-26 ENCOUNTER — Ambulatory Visit: Payer: Medicare Other

## 2020-09-26 DIAGNOSIS — N2581 Secondary hyperparathyroidism of renal origin: Secondary | ICD-10-CM | POA: Diagnosis not present

## 2020-09-26 DIAGNOSIS — D631 Anemia in chronic kidney disease: Secondary | ICD-10-CM | POA: Diagnosis not present

## 2020-09-26 DIAGNOSIS — N186 End stage renal disease: Secondary | ICD-10-CM | POA: Diagnosis not present

## 2020-09-26 NOTE — Progress Notes (Signed)
   Covid-19 Vaccination Clinic  Name:  Wendy Walters    MRN: [id number] DOB: June 28, 1932  09/26/2020  Wendy Walters was observed post Covid-19 immunization for 15 minutes without incident. She was provided with Vaccine Information Sheet and instruction to access the V-Safe system.   Wendy Walters was instructed to call 911 with any severe reactions post vaccine: Marland Kitchen Difficulty breathing  . Swelling of face and throat  . A fast heartbeat  . A bad rash all over body  . Dizziness and weakness

## 2020-09-27 DIAGNOSIS — D631 Anemia in chronic kidney disease: Secondary | ICD-10-CM | POA: Diagnosis not present

## 2020-09-27 DIAGNOSIS — N2581 Secondary hyperparathyroidism of renal origin: Secondary | ICD-10-CM | POA: Diagnosis not present

## 2020-09-27 DIAGNOSIS — N186 End stage renal disease: Secondary | ICD-10-CM | POA: Diagnosis not present

## 2020-09-28 DIAGNOSIS — N186 End stage renal disease: Secondary | ICD-10-CM | POA: Diagnosis not present

## 2020-09-28 DIAGNOSIS — D631 Anemia in chronic kidney disease: Secondary | ICD-10-CM | POA: Diagnosis not present

## 2020-09-28 DIAGNOSIS — N2581 Secondary hyperparathyroidism of renal origin: Secondary | ICD-10-CM | POA: Diagnosis not present

## 2020-09-29 DIAGNOSIS — D631 Anemia in chronic kidney disease: Secondary | ICD-10-CM | POA: Diagnosis not present

## 2020-09-29 DIAGNOSIS — N2581 Secondary hyperparathyroidism of renal origin: Secondary | ICD-10-CM | POA: Diagnosis not present

## 2020-09-29 DIAGNOSIS — N186 End stage renal disease: Secondary | ICD-10-CM | POA: Diagnosis not present

## 2020-09-30 ENCOUNTER — Other Ambulatory Visit (HOSPITAL_BASED_OUTPATIENT_CLINIC_OR_DEPARTMENT_OTHER): Payer: Self-pay

## 2020-09-30 DIAGNOSIS — Z23 Encounter for immunization: Secondary | ICD-10-CM | POA: Diagnosis not present

## 2020-09-30 DIAGNOSIS — D631 Anemia in chronic kidney disease: Secondary | ICD-10-CM | POA: Diagnosis not present

## 2020-09-30 DIAGNOSIS — N186 End stage renal disease: Secondary | ICD-10-CM | POA: Diagnosis not present

## 2020-09-30 DIAGNOSIS — N2581 Secondary hyperparathyroidism of renal origin: Secondary | ICD-10-CM | POA: Diagnosis not present

## 2020-09-30 MED ORDER — PFIZER-BIONT COVID-19 VAC-TRIS 30 MCG/0.3ML IM SUSP
INTRAMUSCULAR | 0 refills | Status: AC
  Filled 2020-09-30: qty 0.3, 1d supply, fill #0

## 2020-10-01 DIAGNOSIS — D631 Anemia in chronic kidney disease: Secondary | ICD-10-CM | POA: Diagnosis not present

## 2020-10-01 DIAGNOSIS — N2581 Secondary hyperparathyroidism of renal origin: Secondary | ICD-10-CM | POA: Diagnosis not present

## 2020-10-01 DIAGNOSIS — N186 End stage renal disease: Secondary | ICD-10-CM | POA: Diagnosis not present

## 2020-10-02 DIAGNOSIS — N186 End stage renal disease: Secondary | ICD-10-CM | POA: Diagnosis not present

## 2020-10-02 DIAGNOSIS — N2581 Secondary hyperparathyroidism of renal origin: Secondary | ICD-10-CM | POA: Diagnosis not present

## 2020-10-02 DIAGNOSIS — D631 Anemia in chronic kidney disease: Secondary | ICD-10-CM | POA: Diagnosis not present

## 2020-10-03 DIAGNOSIS — N186 End stage renal disease: Secondary | ICD-10-CM | POA: Diagnosis not present

## 2020-10-03 DIAGNOSIS — D631 Anemia in chronic kidney disease: Secondary | ICD-10-CM | POA: Diagnosis not present

## 2020-10-03 DIAGNOSIS — N2581 Secondary hyperparathyroidism of renal origin: Secondary | ICD-10-CM | POA: Diagnosis not present

## 2020-10-04 ENCOUNTER — Other Ambulatory Visit (HOSPITAL_BASED_OUTPATIENT_CLINIC_OR_DEPARTMENT_OTHER): Payer: Self-pay

## 2020-10-04 DIAGNOSIS — D631 Anemia in chronic kidney disease: Secondary | ICD-10-CM | POA: Diagnosis not present

## 2020-10-04 DIAGNOSIS — N2581 Secondary hyperparathyroidism of renal origin: Secondary | ICD-10-CM | POA: Diagnosis not present

## 2020-10-04 DIAGNOSIS — N186 End stage renal disease: Secondary | ICD-10-CM | POA: Diagnosis not present

## 2020-10-05 DIAGNOSIS — N2581 Secondary hyperparathyroidism of renal origin: Secondary | ICD-10-CM | POA: Diagnosis not present

## 2020-10-05 DIAGNOSIS — D631 Anemia in chronic kidney disease: Secondary | ICD-10-CM | POA: Diagnosis not present

## 2020-10-05 DIAGNOSIS — I10 Essential (primary) hypertension: Secondary | ICD-10-CM | POA: Diagnosis not present

## 2020-10-05 DIAGNOSIS — N186 End stage renal disease: Secondary | ICD-10-CM | POA: Diagnosis not present

## 2020-10-06 DIAGNOSIS — D631 Anemia in chronic kidney disease: Secondary | ICD-10-CM | POA: Diagnosis not present

## 2020-10-06 DIAGNOSIS — Z4901 Encounter for fitting and adjustment of extracorporeal dialysis catheter: Secondary | ICD-10-CM | POA: Diagnosis not present

## 2020-10-06 DIAGNOSIS — N186 End stage renal disease: Secondary | ICD-10-CM | POA: Diagnosis not present

## 2020-10-06 DIAGNOSIS — N2581 Secondary hyperparathyroidism of renal origin: Secondary | ICD-10-CM | POA: Diagnosis not present

## 2020-10-07 DIAGNOSIS — N186 End stage renal disease: Secondary | ICD-10-CM | POA: Diagnosis not present

## 2020-10-07 DIAGNOSIS — D631 Anemia in chronic kidney disease: Secondary | ICD-10-CM | POA: Diagnosis not present

## 2020-10-07 DIAGNOSIS — N2581 Secondary hyperparathyroidism of renal origin: Secondary | ICD-10-CM | POA: Diagnosis not present

## 2020-10-08 DIAGNOSIS — D631 Anemia in chronic kidney disease: Secondary | ICD-10-CM | POA: Diagnosis not present

## 2020-10-08 DIAGNOSIS — N186 End stage renal disease: Secondary | ICD-10-CM | POA: Diagnosis not present

## 2020-10-08 DIAGNOSIS — N2581 Secondary hyperparathyroidism of renal origin: Secondary | ICD-10-CM | POA: Diagnosis not present

## 2020-10-09 DIAGNOSIS — N186 End stage renal disease: Secondary | ICD-10-CM | POA: Diagnosis not present

## 2020-10-09 DIAGNOSIS — D631 Anemia in chronic kidney disease: Secondary | ICD-10-CM | POA: Diagnosis not present

## 2020-10-09 DIAGNOSIS — N2581 Secondary hyperparathyroidism of renal origin: Secondary | ICD-10-CM | POA: Diagnosis not present

## 2020-10-10 DIAGNOSIS — N186 End stage renal disease: Secondary | ICD-10-CM | POA: Diagnosis not present

## 2020-10-10 DIAGNOSIS — N2581 Secondary hyperparathyroidism of renal origin: Secondary | ICD-10-CM | POA: Diagnosis not present

## 2020-10-10 DIAGNOSIS — D631 Anemia in chronic kidney disease: Secondary | ICD-10-CM | POA: Diagnosis not present

## 2020-10-11 DIAGNOSIS — N2581 Secondary hyperparathyroidism of renal origin: Secondary | ICD-10-CM | POA: Diagnosis not present

## 2020-10-11 DIAGNOSIS — N186 End stage renal disease: Secondary | ICD-10-CM | POA: Diagnosis not present

## 2020-10-11 DIAGNOSIS — D631 Anemia in chronic kidney disease: Secondary | ICD-10-CM | POA: Diagnosis not present

## 2020-10-12 DIAGNOSIS — D631 Anemia in chronic kidney disease: Secondary | ICD-10-CM | POA: Diagnosis not present

## 2020-10-12 DIAGNOSIS — N2581 Secondary hyperparathyroidism of renal origin: Secondary | ICD-10-CM | POA: Diagnosis not present

## 2020-10-12 DIAGNOSIS — N186 End stage renal disease: Secondary | ICD-10-CM | POA: Diagnosis not present

## 2020-10-13 DIAGNOSIS — D631 Anemia in chronic kidney disease: Secondary | ICD-10-CM | POA: Diagnosis not present

## 2020-10-13 DIAGNOSIS — N2581 Secondary hyperparathyroidism of renal origin: Secondary | ICD-10-CM | POA: Diagnosis not present

## 2020-10-13 DIAGNOSIS — N186 End stage renal disease: Secondary | ICD-10-CM | POA: Diagnosis not present

## 2020-10-14 DIAGNOSIS — N2581 Secondary hyperparathyroidism of renal origin: Secondary | ICD-10-CM | POA: Diagnosis not present

## 2020-10-14 DIAGNOSIS — D631 Anemia in chronic kidney disease: Secondary | ICD-10-CM | POA: Diagnosis not present

## 2020-10-14 DIAGNOSIS — N186 End stage renal disease: Secondary | ICD-10-CM | POA: Diagnosis not present

## 2020-10-15 DIAGNOSIS — N186 End stage renal disease: Secondary | ICD-10-CM | POA: Diagnosis not present

## 2020-10-15 DIAGNOSIS — D631 Anemia in chronic kidney disease: Secondary | ICD-10-CM | POA: Diagnosis not present

## 2020-10-15 DIAGNOSIS — N2581 Secondary hyperparathyroidism of renal origin: Secondary | ICD-10-CM | POA: Diagnosis not present

## 2020-10-16 ENCOUNTER — Other Ambulatory Visit: Payer: Self-pay | Admitting: Cardiology

## 2020-10-16 DIAGNOSIS — N186 End stage renal disease: Secondary | ICD-10-CM | POA: Diagnosis not present

## 2020-10-16 DIAGNOSIS — D631 Anemia in chronic kidney disease: Secondary | ICD-10-CM | POA: Diagnosis not present

## 2020-10-16 DIAGNOSIS — N2581 Secondary hyperparathyroidism of renal origin: Secondary | ICD-10-CM | POA: Diagnosis not present

## 2020-10-17 DIAGNOSIS — D631 Anemia in chronic kidney disease: Secondary | ICD-10-CM | POA: Diagnosis not present

## 2020-10-17 DIAGNOSIS — Z7189 Other specified counseling: Secondary | ICD-10-CM | POA: Diagnosis not present

## 2020-10-17 DIAGNOSIS — N2581 Secondary hyperparathyroidism of renal origin: Secondary | ICD-10-CM | POA: Diagnosis not present

## 2020-10-17 DIAGNOSIS — N186 End stage renal disease: Secondary | ICD-10-CM | POA: Diagnosis not present

## 2020-10-17 DIAGNOSIS — Z992 Dependence on renal dialysis: Secondary | ICD-10-CM | POA: Diagnosis not present

## 2020-10-17 DIAGNOSIS — M8000XD Age-related osteoporosis with current pathological fracture, unspecified site, subsequent encounter for fracture with routine healing: Secondary | ICD-10-CM | POA: Diagnosis not present

## 2020-10-18 DIAGNOSIS — N2581 Secondary hyperparathyroidism of renal origin: Secondary | ICD-10-CM | POA: Diagnosis not present

## 2020-10-18 DIAGNOSIS — N186 End stage renal disease: Secondary | ICD-10-CM | POA: Diagnosis not present

## 2020-10-18 DIAGNOSIS — D631 Anemia in chronic kidney disease: Secondary | ICD-10-CM | POA: Diagnosis not present

## 2020-10-19 DIAGNOSIS — N2581 Secondary hyperparathyroidism of renal origin: Secondary | ICD-10-CM | POA: Diagnosis not present

## 2020-10-19 DIAGNOSIS — N186 End stage renal disease: Secondary | ICD-10-CM | POA: Diagnosis not present

## 2020-10-19 DIAGNOSIS — D631 Anemia in chronic kidney disease: Secondary | ICD-10-CM | POA: Diagnosis not present

## 2020-10-20 DIAGNOSIS — N186 End stage renal disease: Secondary | ICD-10-CM | POA: Diagnosis not present

## 2020-10-20 DIAGNOSIS — N2581 Secondary hyperparathyroidism of renal origin: Secondary | ICD-10-CM | POA: Diagnosis not present

## 2020-10-20 DIAGNOSIS — D631 Anemia in chronic kidney disease: Secondary | ICD-10-CM | POA: Diagnosis not present

## 2020-10-21 DIAGNOSIS — N2581 Secondary hyperparathyroidism of renal origin: Secondary | ICD-10-CM | POA: Diagnosis not present

## 2020-10-21 DIAGNOSIS — N186 End stage renal disease: Secondary | ICD-10-CM | POA: Diagnosis not present

## 2020-10-21 DIAGNOSIS — D631 Anemia in chronic kidney disease: Secondary | ICD-10-CM | POA: Diagnosis not present

## 2020-10-22 DIAGNOSIS — N186 End stage renal disease: Secondary | ICD-10-CM | POA: Diagnosis not present

## 2020-10-22 DIAGNOSIS — N2581 Secondary hyperparathyroidism of renal origin: Secondary | ICD-10-CM | POA: Diagnosis not present

## 2020-10-22 DIAGNOSIS — D631 Anemia in chronic kidney disease: Secondary | ICD-10-CM | POA: Diagnosis not present

## 2020-10-23 DIAGNOSIS — D631 Anemia in chronic kidney disease: Secondary | ICD-10-CM | POA: Diagnosis not present

## 2020-10-23 DIAGNOSIS — N2581 Secondary hyperparathyroidism of renal origin: Secondary | ICD-10-CM | POA: Diagnosis not present

## 2020-10-23 DIAGNOSIS — N186 End stage renal disease: Secondary | ICD-10-CM | POA: Diagnosis not present

## 2020-10-24 DIAGNOSIS — N186 End stage renal disease: Secondary | ICD-10-CM | POA: Diagnosis not present

## 2020-10-24 DIAGNOSIS — D631 Anemia in chronic kidney disease: Secondary | ICD-10-CM | POA: Diagnosis not present

## 2020-10-24 DIAGNOSIS — N2581 Secondary hyperparathyroidism of renal origin: Secondary | ICD-10-CM | POA: Diagnosis not present

## 2020-10-25 DIAGNOSIS — N186 End stage renal disease: Secondary | ICD-10-CM | POA: Diagnosis not present

## 2020-10-25 DIAGNOSIS — D631 Anemia in chronic kidney disease: Secondary | ICD-10-CM | POA: Diagnosis not present

## 2020-10-25 DIAGNOSIS — N2581 Secondary hyperparathyroidism of renal origin: Secondary | ICD-10-CM | POA: Diagnosis not present

## 2020-10-26 DIAGNOSIS — N186 End stage renal disease: Secondary | ICD-10-CM | POA: Diagnosis not present

## 2020-10-26 DIAGNOSIS — D631 Anemia in chronic kidney disease: Secondary | ICD-10-CM | POA: Diagnosis not present

## 2020-10-26 DIAGNOSIS — N2581 Secondary hyperparathyroidism of renal origin: Secondary | ICD-10-CM | POA: Diagnosis not present

## 2020-10-27 DIAGNOSIS — N186 End stage renal disease: Secondary | ICD-10-CM | POA: Diagnosis not present

## 2020-10-27 DIAGNOSIS — D631 Anemia in chronic kidney disease: Secondary | ICD-10-CM | POA: Diagnosis not present

## 2020-10-27 DIAGNOSIS — N2581 Secondary hyperparathyroidism of renal origin: Secondary | ICD-10-CM | POA: Diagnosis not present

## 2020-10-28 DIAGNOSIS — N2581 Secondary hyperparathyroidism of renal origin: Secondary | ICD-10-CM | POA: Diagnosis not present

## 2020-10-28 DIAGNOSIS — D631 Anemia in chronic kidney disease: Secondary | ICD-10-CM | POA: Diagnosis not present

## 2020-10-28 DIAGNOSIS — N186 End stage renal disease: Secondary | ICD-10-CM | POA: Diagnosis not present

## 2020-10-29 DIAGNOSIS — N186 End stage renal disease: Secondary | ICD-10-CM | POA: Diagnosis not present

## 2020-10-29 DIAGNOSIS — N2581 Secondary hyperparathyroidism of renal origin: Secondary | ICD-10-CM | POA: Diagnosis not present

## 2020-10-29 DIAGNOSIS — D631 Anemia in chronic kidney disease: Secondary | ICD-10-CM | POA: Diagnosis not present

## 2020-10-30 DIAGNOSIS — D631 Anemia in chronic kidney disease: Secondary | ICD-10-CM | POA: Diagnosis not present

## 2020-10-30 DIAGNOSIS — N2581 Secondary hyperparathyroidism of renal origin: Secondary | ICD-10-CM | POA: Diagnosis not present

## 2020-10-30 DIAGNOSIS — N186 End stage renal disease: Secondary | ICD-10-CM | POA: Diagnosis not present

## 2020-10-31 DIAGNOSIS — D631 Anemia in chronic kidney disease: Secondary | ICD-10-CM | POA: Diagnosis not present

## 2020-10-31 DIAGNOSIS — N186 End stage renal disease: Secondary | ICD-10-CM | POA: Diagnosis not present

## 2020-10-31 DIAGNOSIS — N2581 Secondary hyperparathyroidism of renal origin: Secondary | ICD-10-CM | POA: Diagnosis not present

## 2020-11-01 DIAGNOSIS — N2581 Secondary hyperparathyroidism of renal origin: Secondary | ICD-10-CM | POA: Diagnosis not present

## 2020-11-01 DIAGNOSIS — N186 End stage renal disease: Secondary | ICD-10-CM | POA: Diagnosis not present

## 2020-11-01 DIAGNOSIS — D631 Anemia in chronic kidney disease: Secondary | ICD-10-CM | POA: Diagnosis not present

## 2020-11-02 DIAGNOSIS — N186 End stage renal disease: Secondary | ICD-10-CM | POA: Diagnosis not present

## 2020-11-02 DIAGNOSIS — D631 Anemia in chronic kidney disease: Secondary | ICD-10-CM | POA: Diagnosis not present

## 2020-11-02 DIAGNOSIS — N2581 Secondary hyperparathyroidism of renal origin: Secondary | ICD-10-CM | POA: Diagnosis not present

## 2020-11-03 DIAGNOSIS — D631 Anemia in chronic kidney disease: Secondary | ICD-10-CM | POA: Diagnosis not present

## 2020-11-03 DIAGNOSIS — N2581 Secondary hyperparathyroidism of renal origin: Secondary | ICD-10-CM | POA: Diagnosis not present

## 2020-11-03 DIAGNOSIS — N186 End stage renal disease: Secondary | ICD-10-CM | POA: Diagnosis not present

## 2020-11-04 DIAGNOSIS — I10 Essential (primary) hypertension: Secondary | ICD-10-CM | POA: Diagnosis not present

## 2020-11-04 DIAGNOSIS — N186 End stage renal disease: Secondary | ICD-10-CM | POA: Diagnosis not present

## 2020-11-04 DIAGNOSIS — N2581 Secondary hyperparathyroidism of renal origin: Secondary | ICD-10-CM | POA: Diagnosis not present

## 2020-11-04 DIAGNOSIS — D631 Anemia in chronic kidney disease: Secondary | ICD-10-CM | POA: Diagnosis not present

## 2020-11-05 DIAGNOSIS — D631 Anemia in chronic kidney disease: Secondary | ICD-10-CM | POA: Diagnosis not present

## 2020-11-05 DIAGNOSIS — N2581 Secondary hyperparathyroidism of renal origin: Secondary | ICD-10-CM | POA: Diagnosis not present

## 2020-11-05 DIAGNOSIS — N186 End stage renal disease: Secondary | ICD-10-CM | POA: Diagnosis not present

## 2020-11-06 DIAGNOSIS — D631 Anemia in chronic kidney disease: Secondary | ICD-10-CM | POA: Diagnosis not present

## 2020-11-06 DIAGNOSIS — N186 End stage renal disease: Secondary | ICD-10-CM | POA: Diagnosis not present

## 2020-11-06 DIAGNOSIS — N2581 Secondary hyperparathyroidism of renal origin: Secondary | ICD-10-CM | POA: Diagnosis not present

## 2020-11-07 DIAGNOSIS — D631 Anemia in chronic kidney disease: Secondary | ICD-10-CM | POA: Diagnosis not present

## 2020-11-07 DIAGNOSIS — N2581 Secondary hyperparathyroidism of renal origin: Secondary | ICD-10-CM | POA: Diagnosis not present

## 2020-11-07 DIAGNOSIS — N186 End stage renal disease: Secondary | ICD-10-CM | POA: Diagnosis not present

## 2020-11-08 DIAGNOSIS — D631 Anemia in chronic kidney disease: Secondary | ICD-10-CM | POA: Diagnosis not present

## 2020-11-08 DIAGNOSIS — N2581 Secondary hyperparathyroidism of renal origin: Secondary | ICD-10-CM | POA: Diagnosis not present

## 2020-11-08 DIAGNOSIS — N186 End stage renal disease: Secondary | ICD-10-CM | POA: Diagnosis not present

## 2020-11-09 DIAGNOSIS — D631 Anemia in chronic kidney disease: Secondary | ICD-10-CM | POA: Diagnosis not present

## 2020-11-09 DIAGNOSIS — N186 End stage renal disease: Secondary | ICD-10-CM | POA: Diagnosis not present

## 2020-11-09 DIAGNOSIS — N2581 Secondary hyperparathyroidism of renal origin: Secondary | ICD-10-CM | POA: Diagnosis not present

## 2020-11-10 DIAGNOSIS — D631 Anemia in chronic kidney disease: Secondary | ICD-10-CM | POA: Diagnosis not present

## 2020-11-10 DIAGNOSIS — N2581 Secondary hyperparathyroidism of renal origin: Secondary | ICD-10-CM | POA: Diagnosis not present

## 2020-11-10 DIAGNOSIS — N186 End stage renal disease: Secondary | ICD-10-CM | POA: Diagnosis not present

## 2020-11-11 DIAGNOSIS — N2581 Secondary hyperparathyroidism of renal origin: Secondary | ICD-10-CM | POA: Diagnosis not present

## 2020-11-11 DIAGNOSIS — D631 Anemia in chronic kidney disease: Secondary | ICD-10-CM | POA: Diagnosis not present

## 2020-11-11 DIAGNOSIS — N186 End stage renal disease: Secondary | ICD-10-CM | POA: Diagnosis not present

## 2020-11-12 DIAGNOSIS — D631 Anemia in chronic kidney disease: Secondary | ICD-10-CM | POA: Diagnosis not present

## 2020-11-12 DIAGNOSIS — N186 End stage renal disease: Secondary | ICD-10-CM | POA: Diagnosis not present

## 2020-11-12 DIAGNOSIS — N2581 Secondary hyperparathyroidism of renal origin: Secondary | ICD-10-CM | POA: Diagnosis not present

## 2020-11-13 DIAGNOSIS — N186 End stage renal disease: Secondary | ICD-10-CM | POA: Diagnosis not present

## 2020-11-13 DIAGNOSIS — N2581 Secondary hyperparathyroidism of renal origin: Secondary | ICD-10-CM | POA: Diagnosis not present

## 2020-11-13 DIAGNOSIS — D631 Anemia in chronic kidney disease: Secondary | ICD-10-CM | POA: Diagnosis not present

## 2020-11-14 DIAGNOSIS — N2581 Secondary hyperparathyroidism of renal origin: Secondary | ICD-10-CM | POA: Diagnosis not present

## 2020-11-14 DIAGNOSIS — N186 End stage renal disease: Secondary | ICD-10-CM | POA: Diagnosis not present

## 2020-11-14 DIAGNOSIS — D631 Anemia in chronic kidney disease: Secondary | ICD-10-CM | POA: Diagnosis not present

## 2020-11-15 DIAGNOSIS — N2581 Secondary hyperparathyroidism of renal origin: Secondary | ICD-10-CM | POA: Diagnosis not present

## 2020-11-15 DIAGNOSIS — N186 End stage renal disease: Secondary | ICD-10-CM | POA: Diagnosis not present

## 2020-11-15 DIAGNOSIS — D631 Anemia in chronic kidney disease: Secondary | ICD-10-CM | POA: Diagnosis not present

## 2020-11-16 DIAGNOSIS — D631 Anemia in chronic kidney disease: Secondary | ICD-10-CM | POA: Diagnosis not present

## 2020-11-16 DIAGNOSIS — N2581 Secondary hyperparathyroidism of renal origin: Secondary | ICD-10-CM | POA: Diagnosis not present

## 2020-11-16 DIAGNOSIS — N186 End stage renal disease: Secondary | ICD-10-CM | POA: Diagnosis not present

## 2020-11-17 DIAGNOSIS — N2581 Secondary hyperparathyroidism of renal origin: Secondary | ICD-10-CM | POA: Diagnosis not present

## 2020-11-17 DIAGNOSIS — D631 Anemia in chronic kidney disease: Secondary | ICD-10-CM | POA: Diagnosis not present

## 2020-11-17 DIAGNOSIS — N186 End stage renal disease: Secondary | ICD-10-CM | POA: Diagnosis not present

## 2020-11-18 DIAGNOSIS — Z8781 Personal history of (healed) traumatic fracture: Secondary | ICD-10-CM | POA: Diagnosis not present

## 2020-11-18 DIAGNOSIS — Z7189 Other specified counseling: Secondary | ICD-10-CM | POA: Diagnosis not present

## 2020-11-18 DIAGNOSIS — N2581 Secondary hyperparathyroidism of renal origin: Secondary | ICD-10-CM | POA: Diagnosis not present

## 2020-11-18 DIAGNOSIS — D631 Anemia in chronic kidney disease: Secondary | ICD-10-CM | POA: Diagnosis not present

## 2020-11-18 DIAGNOSIS — N186 End stage renal disease: Secondary | ICD-10-CM | POA: Diagnosis not present

## 2020-11-18 DIAGNOSIS — M810 Age-related osteoporosis without current pathological fracture: Secondary | ICD-10-CM | POA: Diagnosis not present

## 2020-11-19 DIAGNOSIS — D631 Anemia in chronic kidney disease: Secondary | ICD-10-CM | POA: Diagnosis not present

## 2020-11-19 DIAGNOSIS — N186 End stage renal disease: Secondary | ICD-10-CM | POA: Diagnosis not present

## 2020-11-19 DIAGNOSIS — N2581 Secondary hyperparathyroidism of renal origin: Secondary | ICD-10-CM | POA: Diagnosis not present

## 2020-11-20 DIAGNOSIS — D631 Anemia in chronic kidney disease: Secondary | ICD-10-CM | POA: Diagnosis not present

## 2020-11-20 DIAGNOSIS — N2581 Secondary hyperparathyroidism of renal origin: Secondary | ICD-10-CM | POA: Diagnosis not present

## 2020-11-20 DIAGNOSIS — N186 End stage renal disease: Secondary | ICD-10-CM | POA: Diagnosis not present

## 2020-11-21 DIAGNOSIS — N186 End stage renal disease: Secondary | ICD-10-CM | POA: Diagnosis not present

## 2020-11-21 DIAGNOSIS — D631 Anemia in chronic kidney disease: Secondary | ICD-10-CM | POA: Diagnosis not present

## 2020-11-21 DIAGNOSIS — N2581 Secondary hyperparathyroidism of renal origin: Secondary | ICD-10-CM | POA: Diagnosis not present

## 2020-11-22 DIAGNOSIS — N2581 Secondary hyperparathyroidism of renal origin: Secondary | ICD-10-CM | POA: Diagnosis not present

## 2020-11-22 DIAGNOSIS — D631 Anemia in chronic kidney disease: Secondary | ICD-10-CM | POA: Diagnosis not present

## 2020-11-22 DIAGNOSIS — N186 End stage renal disease: Secondary | ICD-10-CM | POA: Diagnosis not present

## 2020-11-23 DIAGNOSIS — N2581 Secondary hyperparathyroidism of renal origin: Secondary | ICD-10-CM | POA: Diagnosis not present

## 2020-11-23 DIAGNOSIS — D631 Anemia in chronic kidney disease: Secondary | ICD-10-CM | POA: Diagnosis not present

## 2020-11-23 DIAGNOSIS — N186 End stage renal disease: Secondary | ICD-10-CM | POA: Diagnosis not present

## 2020-11-24 DIAGNOSIS — N186 End stage renal disease: Secondary | ICD-10-CM | POA: Diagnosis not present

## 2020-11-24 DIAGNOSIS — D631 Anemia in chronic kidney disease: Secondary | ICD-10-CM | POA: Diagnosis not present

## 2020-11-24 DIAGNOSIS — N2581 Secondary hyperparathyroidism of renal origin: Secondary | ICD-10-CM | POA: Diagnosis not present

## 2020-11-25 DIAGNOSIS — N186 End stage renal disease: Secondary | ICD-10-CM | POA: Diagnosis not present

## 2020-11-25 DIAGNOSIS — D631 Anemia in chronic kidney disease: Secondary | ICD-10-CM | POA: Diagnosis not present

## 2020-11-25 DIAGNOSIS — N2581 Secondary hyperparathyroidism of renal origin: Secondary | ICD-10-CM | POA: Diagnosis not present

## 2020-11-26 DIAGNOSIS — N186 End stage renal disease: Secondary | ICD-10-CM | POA: Diagnosis not present

## 2020-11-26 DIAGNOSIS — N2581 Secondary hyperparathyroidism of renal origin: Secondary | ICD-10-CM | POA: Diagnosis not present

## 2020-11-26 DIAGNOSIS — D631 Anemia in chronic kidney disease: Secondary | ICD-10-CM | POA: Diagnosis not present

## 2020-11-27 DIAGNOSIS — N2581 Secondary hyperparathyroidism of renal origin: Secondary | ICD-10-CM | POA: Diagnosis not present

## 2020-11-27 DIAGNOSIS — D631 Anemia in chronic kidney disease: Secondary | ICD-10-CM | POA: Diagnosis not present

## 2020-11-27 DIAGNOSIS — N186 End stage renal disease: Secondary | ICD-10-CM | POA: Diagnosis not present

## 2020-11-28 DIAGNOSIS — D631 Anemia in chronic kidney disease: Secondary | ICD-10-CM | POA: Diagnosis not present

## 2020-11-28 DIAGNOSIS — N2581 Secondary hyperparathyroidism of renal origin: Secondary | ICD-10-CM | POA: Diagnosis not present

## 2020-11-28 DIAGNOSIS — N186 End stage renal disease: Secondary | ICD-10-CM | POA: Diagnosis not present

## 2020-11-29 DIAGNOSIS — N186 End stage renal disease: Secondary | ICD-10-CM | POA: Diagnosis not present

## 2020-11-29 DIAGNOSIS — N2581 Secondary hyperparathyroidism of renal origin: Secondary | ICD-10-CM | POA: Diagnosis not present

## 2020-11-29 DIAGNOSIS — D631 Anemia in chronic kidney disease: Secondary | ICD-10-CM | POA: Diagnosis not present

## 2020-11-30 DIAGNOSIS — N186 End stage renal disease: Secondary | ICD-10-CM | POA: Diagnosis not present

## 2020-11-30 DIAGNOSIS — N2581 Secondary hyperparathyroidism of renal origin: Secondary | ICD-10-CM | POA: Diagnosis not present

## 2020-11-30 DIAGNOSIS — D631 Anemia in chronic kidney disease: Secondary | ICD-10-CM | POA: Diagnosis not present

## 2020-12-01 DIAGNOSIS — D631 Anemia in chronic kidney disease: Secondary | ICD-10-CM | POA: Diagnosis not present

## 2020-12-01 DIAGNOSIS — N2581 Secondary hyperparathyroidism of renal origin: Secondary | ICD-10-CM | POA: Diagnosis not present

## 2020-12-01 DIAGNOSIS — N186 End stage renal disease: Secondary | ICD-10-CM | POA: Diagnosis not present

## 2020-12-02 DIAGNOSIS — N2581 Secondary hyperparathyroidism of renal origin: Secondary | ICD-10-CM | POA: Diagnosis not present

## 2020-12-02 DIAGNOSIS — D631 Anemia in chronic kidney disease: Secondary | ICD-10-CM | POA: Diagnosis not present

## 2020-12-02 DIAGNOSIS — N186 End stage renal disease: Secondary | ICD-10-CM | POA: Diagnosis not present

## 2020-12-03 DIAGNOSIS — N186 End stage renal disease: Secondary | ICD-10-CM | POA: Diagnosis not present

## 2020-12-03 DIAGNOSIS — D631 Anemia in chronic kidney disease: Secondary | ICD-10-CM | POA: Diagnosis not present

## 2020-12-03 DIAGNOSIS — N2581 Secondary hyperparathyroidism of renal origin: Secondary | ICD-10-CM | POA: Diagnosis not present

## 2020-12-04 DIAGNOSIS — N2581 Secondary hyperparathyroidism of renal origin: Secondary | ICD-10-CM | POA: Diagnosis not present

## 2020-12-04 DIAGNOSIS — N186 End stage renal disease: Secondary | ICD-10-CM | POA: Diagnosis not present

## 2020-12-04 DIAGNOSIS — D631 Anemia in chronic kidney disease: Secondary | ICD-10-CM | POA: Diagnosis not present

## 2020-12-05 DIAGNOSIS — N186 End stage renal disease: Secondary | ICD-10-CM | POA: Diagnosis not present

## 2020-12-05 DIAGNOSIS — D649 Anemia, unspecified: Secondary | ICD-10-CM | POA: Diagnosis not present

## 2020-12-05 DIAGNOSIS — D631 Anemia in chronic kidney disease: Secondary | ICD-10-CM | POA: Diagnosis not present

## 2020-12-05 DIAGNOSIS — N2581 Secondary hyperparathyroidism of renal origin: Secondary | ICD-10-CM | POA: Diagnosis not present

## 2020-12-05 DIAGNOSIS — I10 Essential (primary) hypertension: Secondary | ICD-10-CM | POA: Diagnosis not present

## 2020-12-06 DIAGNOSIS — D631 Anemia in chronic kidney disease: Secondary | ICD-10-CM | POA: Diagnosis not present

## 2020-12-06 DIAGNOSIS — N186 End stage renal disease: Secondary | ICD-10-CM | POA: Diagnosis not present

## 2020-12-06 DIAGNOSIS — N2581 Secondary hyperparathyroidism of renal origin: Secondary | ICD-10-CM | POA: Diagnosis not present

## 2020-12-07 DIAGNOSIS — D631 Anemia in chronic kidney disease: Secondary | ICD-10-CM | POA: Diagnosis not present

## 2020-12-07 DIAGNOSIS — N2581 Secondary hyperparathyroidism of renal origin: Secondary | ICD-10-CM | POA: Diagnosis not present

## 2020-12-07 DIAGNOSIS — N186 End stage renal disease: Secondary | ICD-10-CM | POA: Diagnosis not present

## 2020-12-08 DIAGNOSIS — N186 End stage renal disease: Secondary | ICD-10-CM | POA: Diagnosis not present

## 2020-12-08 DIAGNOSIS — D631 Anemia in chronic kidney disease: Secondary | ICD-10-CM | POA: Diagnosis not present

## 2020-12-08 DIAGNOSIS — N2581 Secondary hyperparathyroidism of renal origin: Secondary | ICD-10-CM | POA: Diagnosis not present

## 2020-12-09 DIAGNOSIS — N186 End stage renal disease: Secondary | ICD-10-CM | POA: Diagnosis not present

## 2020-12-09 DIAGNOSIS — N2581 Secondary hyperparathyroidism of renal origin: Secondary | ICD-10-CM | POA: Diagnosis not present

## 2020-12-09 DIAGNOSIS — D631 Anemia in chronic kidney disease: Secondary | ICD-10-CM | POA: Diagnosis not present

## 2020-12-10 DIAGNOSIS — N186 End stage renal disease: Secondary | ICD-10-CM | POA: Diagnosis not present

## 2020-12-10 DIAGNOSIS — D631 Anemia in chronic kidney disease: Secondary | ICD-10-CM | POA: Diagnosis not present

## 2020-12-10 DIAGNOSIS — N2581 Secondary hyperparathyroidism of renal origin: Secondary | ICD-10-CM | POA: Diagnosis not present

## 2020-12-11 DIAGNOSIS — D631 Anemia in chronic kidney disease: Secondary | ICD-10-CM | POA: Diagnosis not present

## 2020-12-11 DIAGNOSIS — N186 End stage renal disease: Secondary | ICD-10-CM | POA: Diagnosis not present

## 2020-12-11 DIAGNOSIS — N2581 Secondary hyperparathyroidism of renal origin: Secondary | ICD-10-CM | POA: Diagnosis not present

## 2020-12-12 DIAGNOSIS — N2581 Secondary hyperparathyroidism of renal origin: Secondary | ICD-10-CM | POA: Diagnosis not present

## 2020-12-12 DIAGNOSIS — D631 Anemia in chronic kidney disease: Secondary | ICD-10-CM | POA: Diagnosis not present

## 2020-12-12 DIAGNOSIS — N186 End stage renal disease: Secondary | ICD-10-CM | POA: Diagnosis not present

## 2020-12-13 DIAGNOSIS — N2581 Secondary hyperparathyroidism of renal origin: Secondary | ICD-10-CM | POA: Diagnosis not present

## 2020-12-13 DIAGNOSIS — N186 End stage renal disease: Secondary | ICD-10-CM | POA: Diagnosis not present

## 2020-12-13 DIAGNOSIS — D631 Anemia in chronic kidney disease: Secondary | ICD-10-CM | POA: Diagnosis not present

## 2020-12-14 DIAGNOSIS — N186 End stage renal disease: Secondary | ICD-10-CM | POA: Diagnosis not present

## 2020-12-14 DIAGNOSIS — D631 Anemia in chronic kidney disease: Secondary | ICD-10-CM | POA: Diagnosis not present

## 2020-12-14 DIAGNOSIS — N2581 Secondary hyperparathyroidism of renal origin: Secondary | ICD-10-CM | POA: Diagnosis not present

## 2020-12-15 DIAGNOSIS — N2581 Secondary hyperparathyroidism of renal origin: Secondary | ICD-10-CM | POA: Diagnosis not present

## 2020-12-15 DIAGNOSIS — D631 Anemia in chronic kidney disease: Secondary | ICD-10-CM | POA: Diagnosis not present

## 2020-12-15 DIAGNOSIS — N186 End stage renal disease: Secondary | ICD-10-CM | POA: Diagnosis not present

## 2020-12-16 DIAGNOSIS — D631 Anemia in chronic kidney disease: Secondary | ICD-10-CM | POA: Diagnosis not present

## 2020-12-16 DIAGNOSIS — N186 End stage renal disease: Secondary | ICD-10-CM | POA: Diagnosis not present

## 2020-12-16 DIAGNOSIS — N2581 Secondary hyperparathyroidism of renal origin: Secondary | ICD-10-CM | POA: Diagnosis not present

## 2020-12-17 DIAGNOSIS — N186 End stage renal disease: Secondary | ICD-10-CM | POA: Diagnosis not present

## 2020-12-17 DIAGNOSIS — D631 Anemia in chronic kidney disease: Secondary | ICD-10-CM | POA: Diagnosis not present

## 2020-12-17 DIAGNOSIS — N2581 Secondary hyperparathyroidism of renal origin: Secondary | ICD-10-CM | POA: Diagnosis not present

## 2020-12-18 DIAGNOSIS — D631 Anemia in chronic kidney disease: Secondary | ICD-10-CM | POA: Diagnosis not present

## 2020-12-18 DIAGNOSIS — N2581 Secondary hyperparathyroidism of renal origin: Secondary | ICD-10-CM | POA: Diagnosis not present

## 2020-12-18 DIAGNOSIS — N186 End stage renal disease: Secondary | ICD-10-CM | POA: Diagnosis not present

## 2020-12-19 DIAGNOSIS — N2581 Secondary hyperparathyroidism of renal origin: Secondary | ICD-10-CM | POA: Diagnosis not present

## 2020-12-19 DIAGNOSIS — Z8781 Personal history of (healed) traumatic fracture: Secondary | ICD-10-CM | POA: Diagnosis not present

## 2020-12-19 DIAGNOSIS — D631 Anemia in chronic kidney disease: Secondary | ICD-10-CM | POA: Diagnosis not present

## 2020-12-19 DIAGNOSIS — M810 Age-related osteoporosis without current pathological fracture: Secondary | ICD-10-CM | POA: Diagnosis not present

## 2020-12-19 DIAGNOSIS — Z7189 Other specified counseling: Secondary | ICD-10-CM | POA: Diagnosis not present

## 2020-12-19 DIAGNOSIS — N186 End stage renal disease: Secondary | ICD-10-CM | POA: Diagnosis not present

## 2020-12-20 DIAGNOSIS — N2581 Secondary hyperparathyroidism of renal origin: Secondary | ICD-10-CM | POA: Diagnosis not present

## 2020-12-20 DIAGNOSIS — N186 End stage renal disease: Secondary | ICD-10-CM | POA: Diagnosis not present

## 2020-12-20 DIAGNOSIS — D631 Anemia in chronic kidney disease: Secondary | ICD-10-CM | POA: Diagnosis not present

## 2020-12-21 DIAGNOSIS — D631 Anemia in chronic kidney disease: Secondary | ICD-10-CM | POA: Diagnosis not present

## 2020-12-21 DIAGNOSIS — N2581 Secondary hyperparathyroidism of renal origin: Secondary | ICD-10-CM | POA: Diagnosis not present

## 2020-12-21 DIAGNOSIS — N186 End stage renal disease: Secondary | ICD-10-CM | POA: Diagnosis not present

## 2020-12-22 DIAGNOSIS — N2581 Secondary hyperparathyroidism of renal origin: Secondary | ICD-10-CM | POA: Diagnosis not present

## 2020-12-22 DIAGNOSIS — N186 End stage renal disease: Secondary | ICD-10-CM | POA: Diagnosis not present

## 2020-12-22 DIAGNOSIS — D631 Anemia in chronic kidney disease: Secondary | ICD-10-CM | POA: Diagnosis not present

## 2020-12-23 DIAGNOSIS — D631 Anemia in chronic kidney disease: Secondary | ICD-10-CM | POA: Diagnosis not present

## 2020-12-23 DIAGNOSIS — N2581 Secondary hyperparathyroidism of renal origin: Secondary | ICD-10-CM | POA: Diagnosis not present

## 2020-12-23 DIAGNOSIS — N186 End stage renal disease: Secondary | ICD-10-CM | POA: Diagnosis not present

## 2020-12-24 DIAGNOSIS — N186 End stage renal disease: Secondary | ICD-10-CM | POA: Diagnosis not present

## 2020-12-24 DIAGNOSIS — D631 Anemia in chronic kidney disease: Secondary | ICD-10-CM | POA: Diagnosis not present

## 2020-12-24 DIAGNOSIS — N2581 Secondary hyperparathyroidism of renal origin: Secondary | ICD-10-CM | POA: Diagnosis not present

## 2020-12-25 DIAGNOSIS — N2581 Secondary hyperparathyroidism of renal origin: Secondary | ICD-10-CM | POA: Diagnosis not present

## 2020-12-25 DIAGNOSIS — N186 End stage renal disease: Secondary | ICD-10-CM | POA: Diagnosis not present

## 2020-12-25 DIAGNOSIS — D631 Anemia in chronic kidney disease: Secondary | ICD-10-CM | POA: Diagnosis not present

## 2020-12-26 DIAGNOSIS — N2581 Secondary hyperparathyroidism of renal origin: Secondary | ICD-10-CM | POA: Diagnosis not present

## 2020-12-26 DIAGNOSIS — N186 End stage renal disease: Secondary | ICD-10-CM | POA: Diagnosis not present

## 2020-12-26 DIAGNOSIS — D631 Anemia in chronic kidney disease: Secondary | ICD-10-CM | POA: Diagnosis not present

## 2020-12-27 DIAGNOSIS — N186 End stage renal disease: Secondary | ICD-10-CM | POA: Diagnosis not present

## 2020-12-27 DIAGNOSIS — N2581 Secondary hyperparathyroidism of renal origin: Secondary | ICD-10-CM | POA: Diagnosis not present

## 2020-12-27 DIAGNOSIS — D631 Anemia in chronic kidney disease: Secondary | ICD-10-CM | POA: Diagnosis not present

## 2020-12-28 DIAGNOSIS — N2581 Secondary hyperparathyroidism of renal origin: Secondary | ICD-10-CM | POA: Diagnosis not present

## 2020-12-28 DIAGNOSIS — N186 End stage renal disease: Secondary | ICD-10-CM | POA: Diagnosis not present

## 2020-12-28 DIAGNOSIS — D631 Anemia in chronic kidney disease: Secondary | ICD-10-CM | POA: Diagnosis not present

## 2020-12-29 DIAGNOSIS — N2581 Secondary hyperparathyroidism of renal origin: Secondary | ICD-10-CM | POA: Diagnosis not present

## 2020-12-29 DIAGNOSIS — D631 Anemia in chronic kidney disease: Secondary | ICD-10-CM | POA: Diagnosis not present

## 2020-12-29 DIAGNOSIS — N186 End stage renal disease: Secondary | ICD-10-CM | POA: Diagnosis not present

## 2020-12-30 DIAGNOSIS — N186 End stage renal disease: Secondary | ICD-10-CM | POA: Diagnosis not present

## 2020-12-30 DIAGNOSIS — D631 Anemia in chronic kidney disease: Secondary | ICD-10-CM | POA: Diagnosis not present

## 2020-12-30 DIAGNOSIS — N2581 Secondary hyperparathyroidism of renal origin: Secondary | ICD-10-CM | POA: Diagnosis not present

## 2020-12-31 DIAGNOSIS — D631 Anemia in chronic kidney disease: Secondary | ICD-10-CM | POA: Diagnosis not present

## 2020-12-31 DIAGNOSIS — N186 End stage renal disease: Secondary | ICD-10-CM | POA: Diagnosis not present

## 2020-12-31 DIAGNOSIS — N2581 Secondary hyperparathyroidism of renal origin: Secondary | ICD-10-CM | POA: Diagnosis not present

## 2021-01-01 DIAGNOSIS — N186 End stage renal disease: Secondary | ICD-10-CM | POA: Diagnosis not present

## 2021-01-01 DIAGNOSIS — D631 Anemia in chronic kidney disease: Secondary | ICD-10-CM | POA: Diagnosis not present

## 2021-01-01 DIAGNOSIS — N2581 Secondary hyperparathyroidism of renal origin: Secondary | ICD-10-CM | POA: Diagnosis not present

## 2021-01-02 DIAGNOSIS — N186 End stage renal disease: Secondary | ICD-10-CM | POA: Diagnosis not present

## 2021-01-02 DIAGNOSIS — D631 Anemia in chronic kidney disease: Secondary | ICD-10-CM | POA: Diagnosis not present

## 2021-01-02 DIAGNOSIS — N2581 Secondary hyperparathyroidism of renal origin: Secondary | ICD-10-CM | POA: Diagnosis not present

## 2021-01-03 DIAGNOSIS — N2581 Secondary hyperparathyroidism of renal origin: Secondary | ICD-10-CM | POA: Diagnosis not present

## 2021-01-03 DIAGNOSIS — D631 Anemia in chronic kidney disease: Secondary | ICD-10-CM | POA: Diagnosis not present

## 2021-01-03 DIAGNOSIS — N186 End stage renal disease: Secondary | ICD-10-CM | POA: Diagnosis not present

## 2021-01-04 DIAGNOSIS — N2581 Secondary hyperparathyroidism of renal origin: Secondary | ICD-10-CM | POA: Diagnosis not present

## 2021-01-04 DIAGNOSIS — N186 End stage renal disease: Secondary | ICD-10-CM | POA: Diagnosis not present

## 2021-01-04 DIAGNOSIS — D631 Anemia in chronic kidney disease: Secondary | ICD-10-CM | POA: Diagnosis not present

## 2021-01-05 DIAGNOSIS — D649 Anemia, unspecified: Secondary | ICD-10-CM | POA: Diagnosis not present

## 2021-01-05 DIAGNOSIS — N2581 Secondary hyperparathyroidism of renal origin: Secondary | ICD-10-CM | POA: Diagnosis not present

## 2021-01-05 DIAGNOSIS — I10 Essential (primary) hypertension: Secondary | ICD-10-CM | POA: Diagnosis not present

## 2021-01-05 DIAGNOSIS — D631 Anemia in chronic kidney disease: Secondary | ICD-10-CM | POA: Diagnosis not present

## 2021-01-05 DIAGNOSIS — N186 End stage renal disease: Secondary | ICD-10-CM | POA: Diagnosis not present

## 2021-01-06 ENCOUNTER — Other Ambulatory Visit (HOSPITAL_BASED_OUTPATIENT_CLINIC_OR_DEPARTMENT_OTHER): Payer: Self-pay

## 2021-01-06 DIAGNOSIS — N2581 Secondary hyperparathyroidism of renal origin: Secondary | ICD-10-CM | POA: Diagnosis not present

## 2021-01-06 DIAGNOSIS — N186 End stage renal disease: Secondary | ICD-10-CM | POA: Diagnosis not present

## 2021-01-06 DIAGNOSIS — D631 Anemia in chronic kidney disease: Secondary | ICD-10-CM | POA: Diagnosis not present

## 2021-01-07 DIAGNOSIS — N2581 Secondary hyperparathyroidism of renal origin: Secondary | ICD-10-CM | POA: Diagnosis not present

## 2021-01-07 DIAGNOSIS — D631 Anemia in chronic kidney disease: Secondary | ICD-10-CM | POA: Diagnosis not present

## 2021-01-07 DIAGNOSIS — N186 End stage renal disease: Secondary | ICD-10-CM | POA: Diagnosis not present

## 2021-01-08 DIAGNOSIS — N186 End stage renal disease: Secondary | ICD-10-CM | POA: Diagnosis not present

## 2021-01-08 DIAGNOSIS — D631 Anemia in chronic kidney disease: Secondary | ICD-10-CM | POA: Diagnosis not present

## 2021-01-08 DIAGNOSIS — N2581 Secondary hyperparathyroidism of renal origin: Secondary | ICD-10-CM | POA: Diagnosis not present

## 2021-01-09 DIAGNOSIS — D631 Anemia in chronic kidney disease: Secondary | ICD-10-CM | POA: Diagnosis not present

## 2021-01-09 DIAGNOSIS — N2581 Secondary hyperparathyroidism of renal origin: Secondary | ICD-10-CM | POA: Diagnosis not present

## 2021-01-09 DIAGNOSIS — N186 End stage renal disease: Secondary | ICD-10-CM | POA: Diagnosis not present

## 2021-01-10 DIAGNOSIS — D631 Anemia in chronic kidney disease: Secondary | ICD-10-CM | POA: Diagnosis not present

## 2021-01-10 DIAGNOSIS — N186 End stage renal disease: Secondary | ICD-10-CM | POA: Diagnosis not present

## 2021-01-10 DIAGNOSIS — N2581 Secondary hyperparathyroidism of renal origin: Secondary | ICD-10-CM | POA: Diagnosis not present

## 2021-01-11 DIAGNOSIS — D631 Anemia in chronic kidney disease: Secondary | ICD-10-CM | POA: Diagnosis not present

## 2021-01-11 DIAGNOSIS — N2581 Secondary hyperparathyroidism of renal origin: Secondary | ICD-10-CM | POA: Diagnosis not present

## 2021-01-11 DIAGNOSIS — N186 End stage renal disease: Secondary | ICD-10-CM | POA: Diagnosis not present

## 2021-01-12 DIAGNOSIS — D631 Anemia in chronic kidney disease: Secondary | ICD-10-CM | POA: Diagnosis not present

## 2021-01-12 DIAGNOSIS — N186 End stage renal disease: Secondary | ICD-10-CM | POA: Diagnosis not present

## 2021-01-12 DIAGNOSIS — N2581 Secondary hyperparathyroidism of renal origin: Secondary | ICD-10-CM | POA: Diagnosis not present

## 2021-01-13 DIAGNOSIS — D631 Anemia in chronic kidney disease: Secondary | ICD-10-CM | POA: Diagnosis not present

## 2021-01-13 DIAGNOSIS — N2581 Secondary hyperparathyroidism of renal origin: Secondary | ICD-10-CM | POA: Diagnosis not present

## 2021-01-13 DIAGNOSIS — N186 End stage renal disease: Secondary | ICD-10-CM | POA: Diagnosis not present

## 2021-01-14 DIAGNOSIS — D631 Anemia in chronic kidney disease: Secondary | ICD-10-CM | POA: Diagnosis not present

## 2021-01-14 DIAGNOSIS — N2581 Secondary hyperparathyroidism of renal origin: Secondary | ICD-10-CM | POA: Diagnosis not present

## 2021-01-14 DIAGNOSIS — N186 End stage renal disease: Secondary | ICD-10-CM | POA: Diagnosis not present

## 2021-01-15 DIAGNOSIS — D631 Anemia in chronic kidney disease: Secondary | ICD-10-CM | POA: Diagnosis not present

## 2021-01-15 DIAGNOSIS — N2581 Secondary hyperparathyroidism of renal origin: Secondary | ICD-10-CM | POA: Diagnosis not present

## 2021-01-15 DIAGNOSIS — N186 End stage renal disease: Secondary | ICD-10-CM | POA: Diagnosis not present

## 2021-01-16 DIAGNOSIS — N186 End stage renal disease: Secondary | ICD-10-CM | POA: Diagnosis not present

## 2021-01-16 DIAGNOSIS — M79604 Pain in right leg: Secondary | ICD-10-CM | POA: Diagnosis not present

## 2021-01-16 DIAGNOSIS — D631 Anemia in chronic kidney disease: Secondary | ICD-10-CM | POA: Diagnosis not present

## 2021-01-16 DIAGNOSIS — N2581 Secondary hyperparathyroidism of renal origin: Secondary | ICD-10-CM | POA: Diagnosis not present

## 2021-01-16 DIAGNOSIS — L03115 Cellulitis of right lower limb: Secondary | ICD-10-CM | POA: Diagnosis not present

## 2021-01-16 DIAGNOSIS — Z992 Dependence on renal dialysis: Secondary | ICD-10-CM | POA: Diagnosis not present

## 2021-01-17 DIAGNOSIS — N186 End stage renal disease: Secondary | ICD-10-CM | POA: Diagnosis not present

## 2021-01-17 DIAGNOSIS — D631 Anemia in chronic kidney disease: Secondary | ICD-10-CM | POA: Diagnosis not present

## 2021-01-17 DIAGNOSIS — N2581 Secondary hyperparathyroidism of renal origin: Secondary | ICD-10-CM | POA: Diagnosis not present

## 2021-01-18 DIAGNOSIS — M810 Age-related osteoporosis without current pathological fracture: Secondary | ICD-10-CM | POA: Diagnosis not present

## 2021-01-18 DIAGNOSIS — N186 End stage renal disease: Secondary | ICD-10-CM | POA: Diagnosis not present

## 2021-01-18 DIAGNOSIS — Z8781 Personal history of (healed) traumatic fracture: Secondary | ICD-10-CM | POA: Diagnosis not present

## 2021-01-18 DIAGNOSIS — N2581 Secondary hyperparathyroidism of renal origin: Secondary | ICD-10-CM | POA: Diagnosis not present

## 2021-01-18 DIAGNOSIS — D631 Anemia in chronic kidney disease: Secondary | ICD-10-CM | POA: Diagnosis not present

## 2021-01-18 DIAGNOSIS — Z7189 Other specified counseling: Secondary | ICD-10-CM | POA: Diagnosis not present

## 2021-01-19 DIAGNOSIS — N2581 Secondary hyperparathyroidism of renal origin: Secondary | ICD-10-CM | POA: Diagnosis not present

## 2021-01-19 DIAGNOSIS — D631 Anemia in chronic kidney disease: Secondary | ICD-10-CM | POA: Diagnosis not present

## 2021-01-19 DIAGNOSIS — N186 End stage renal disease: Secondary | ICD-10-CM | POA: Diagnosis not present

## 2021-01-20 DIAGNOSIS — N2581 Secondary hyperparathyroidism of renal origin: Secondary | ICD-10-CM | POA: Diagnosis not present

## 2021-01-20 DIAGNOSIS — N186 End stage renal disease: Secondary | ICD-10-CM | POA: Diagnosis not present

## 2021-01-20 DIAGNOSIS — D631 Anemia in chronic kidney disease: Secondary | ICD-10-CM | POA: Diagnosis not present

## 2021-01-21 DIAGNOSIS — D631 Anemia in chronic kidney disease: Secondary | ICD-10-CM | POA: Diagnosis not present

## 2021-01-21 DIAGNOSIS — N186 End stage renal disease: Secondary | ICD-10-CM | POA: Diagnosis not present

## 2021-01-21 DIAGNOSIS — N2581 Secondary hyperparathyroidism of renal origin: Secondary | ICD-10-CM | POA: Diagnosis not present

## 2021-01-22 DIAGNOSIS — N2581 Secondary hyperparathyroidism of renal origin: Secondary | ICD-10-CM | POA: Diagnosis not present

## 2021-01-22 DIAGNOSIS — N186 End stage renal disease: Secondary | ICD-10-CM | POA: Diagnosis not present

## 2021-01-22 DIAGNOSIS — D631 Anemia in chronic kidney disease: Secondary | ICD-10-CM | POA: Diagnosis not present

## 2021-01-23 DIAGNOSIS — D631 Anemia in chronic kidney disease: Secondary | ICD-10-CM | POA: Diagnosis not present

## 2021-01-23 DIAGNOSIS — N2581 Secondary hyperparathyroidism of renal origin: Secondary | ICD-10-CM | POA: Diagnosis not present

## 2021-01-23 DIAGNOSIS — N186 End stage renal disease: Secondary | ICD-10-CM | POA: Diagnosis not present

## 2021-01-24 DIAGNOSIS — N2581 Secondary hyperparathyroidism of renal origin: Secondary | ICD-10-CM | POA: Diagnosis not present

## 2021-01-24 DIAGNOSIS — N186 End stage renal disease: Secondary | ICD-10-CM | POA: Diagnosis not present

## 2021-01-24 DIAGNOSIS — D631 Anemia in chronic kidney disease: Secondary | ICD-10-CM | POA: Diagnosis not present

## 2021-01-25 DIAGNOSIS — M79604 Pain in right leg: Secondary | ICD-10-CM | POA: Diagnosis not present

## 2021-01-25 DIAGNOSIS — N186 End stage renal disease: Secondary | ICD-10-CM | POA: Diagnosis not present

## 2021-01-25 DIAGNOSIS — Z23 Encounter for immunization: Secondary | ICD-10-CM | POA: Diagnosis not present

## 2021-01-25 DIAGNOSIS — D631 Anemia in chronic kidney disease: Secondary | ICD-10-CM | POA: Diagnosis not present

## 2021-01-25 DIAGNOSIS — Z992 Dependence on renal dialysis: Secondary | ICD-10-CM | POA: Diagnosis not present

## 2021-01-25 DIAGNOSIS — N2581 Secondary hyperparathyroidism of renal origin: Secondary | ICD-10-CM | POA: Diagnosis not present

## 2021-01-25 DIAGNOSIS — L03115 Cellulitis of right lower limb: Secondary | ICD-10-CM | POA: Diagnosis not present

## 2021-01-26 DIAGNOSIS — N186 End stage renal disease: Secondary | ICD-10-CM | POA: Diagnosis not present

## 2021-01-26 DIAGNOSIS — N2581 Secondary hyperparathyroidism of renal origin: Secondary | ICD-10-CM | POA: Diagnosis not present

## 2021-01-26 DIAGNOSIS — D631 Anemia in chronic kidney disease: Secondary | ICD-10-CM | POA: Diagnosis not present

## 2021-01-27 DIAGNOSIS — N186 End stage renal disease: Secondary | ICD-10-CM | POA: Diagnosis not present

## 2021-01-27 DIAGNOSIS — Z23 Encounter for immunization: Secondary | ICD-10-CM | POA: Diagnosis not present

## 2021-01-27 DIAGNOSIS — N2581 Secondary hyperparathyroidism of renal origin: Secondary | ICD-10-CM | POA: Diagnosis not present

## 2021-01-27 DIAGNOSIS — D631 Anemia in chronic kidney disease: Secondary | ICD-10-CM | POA: Diagnosis not present

## 2021-01-28 DIAGNOSIS — D631 Anemia in chronic kidney disease: Secondary | ICD-10-CM | POA: Diagnosis not present

## 2021-01-28 DIAGNOSIS — N186 End stage renal disease: Secondary | ICD-10-CM | POA: Diagnosis not present

## 2021-01-28 DIAGNOSIS — N2581 Secondary hyperparathyroidism of renal origin: Secondary | ICD-10-CM | POA: Diagnosis not present

## 2021-01-29 DIAGNOSIS — N186 End stage renal disease: Secondary | ICD-10-CM | POA: Diagnosis not present

## 2021-01-29 DIAGNOSIS — D631 Anemia in chronic kidney disease: Secondary | ICD-10-CM | POA: Diagnosis not present

## 2021-01-29 DIAGNOSIS — N2581 Secondary hyperparathyroidism of renal origin: Secondary | ICD-10-CM | POA: Diagnosis not present

## 2021-01-30 DIAGNOSIS — D631 Anemia in chronic kidney disease: Secondary | ICD-10-CM | POA: Diagnosis not present

## 2021-01-30 DIAGNOSIS — N186 End stage renal disease: Secondary | ICD-10-CM | POA: Diagnosis not present

## 2021-01-30 DIAGNOSIS — N2581 Secondary hyperparathyroidism of renal origin: Secondary | ICD-10-CM | POA: Diagnosis not present

## 2021-01-31 DIAGNOSIS — N2581 Secondary hyperparathyroidism of renal origin: Secondary | ICD-10-CM | POA: Diagnosis not present

## 2021-01-31 DIAGNOSIS — D631 Anemia in chronic kidney disease: Secondary | ICD-10-CM | POA: Diagnosis not present

## 2021-01-31 DIAGNOSIS — N186 End stage renal disease: Secondary | ICD-10-CM | POA: Diagnosis not present

## 2021-02-01 DIAGNOSIS — N186 End stage renal disease: Secondary | ICD-10-CM | POA: Diagnosis not present

## 2021-02-01 DIAGNOSIS — D631 Anemia in chronic kidney disease: Secondary | ICD-10-CM | POA: Diagnosis not present

## 2021-02-01 DIAGNOSIS — N2581 Secondary hyperparathyroidism of renal origin: Secondary | ICD-10-CM | POA: Diagnosis not present

## 2021-02-02 DIAGNOSIS — N2581 Secondary hyperparathyroidism of renal origin: Secondary | ICD-10-CM | POA: Diagnosis not present

## 2021-02-02 DIAGNOSIS — N186 End stage renal disease: Secondary | ICD-10-CM | POA: Diagnosis not present

## 2021-02-02 DIAGNOSIS — D631 Anemia in chronic kidney disease: Secondary | ICD-10-CM | POA: Diagnosis not present

## 2021-02-03 DIAGNOSIS — N186 End stage renal disease: Secondary | ICD-10-CM | POA: Diagnosis not present

## 2021-02-03 DIAGNOSIS — D631 Anemia in chronic kidney disease: Secondary | ICD-10-CM | POA: Diagnosis not present

## 2021-02-03 DIAGNOSIS — N2581 Secondary hyperparathyroidism of renal origin: Secondary | ICD-10-CM | POA: Diagnosis not present

## 2021-02-04 DIAGNOSIS — N2581 Secondary hyperparathyroidism of renal origin: Secondary | ICD-10-CM | POA: Diagnosis not present

## 2021-02-04 DIAGNOSIS — Z418 Encounter for other procedures for purposes other than remedying health state: Secondary | ICD-10-CM | POA: Diagnosis not present

## 2021-02-04 DIAGNOSIS — T8571XA Infection and inflammatory reaction due to peritoneal dialysis catheter, initial encounter: Secondary | ICD-10-CM | POA: Diagnosis not present

## 2021-02-04 DIAGNOSIS — K659 Peritonitis, unspecified: Secondary | ICD-10-CM | POA: Diagnosis not present

## 2021-02-04 DIAGNOSIS — D631 Anemia in chronic kidney disease: Secondary | ICD-10-CM | POA: Diagnosis not present

## 2021-02-04 DIAGNOSIS — I10 Essential (primary) hypertension: Secondary | ICD-10-CM | POA: Diagnosis not present

## 2021-02-04 DIAGNOSIS — N186 End stage renal disease: Secondary | ICD-10-CM | POA: Diagnosis not present

## 2021-02-04 DIAGNOSIS — D649 Anemia, unspecified: Secondary | ICD-10-CM | POA: Diagnosis not present

## 2021-02-05 DIAGNOSIS — K659 Peritonitis, unspecified: Secondary | ICD-10-CM | POA: Diagnosis not present

## 2021-02-05 DIAGNOSIS — D631 Anemia in chronic kidney disease: Secondary | ICD-10-CM | POA: Diagnosis not present

## 2021-02-05 DIAGNOSIS — Z418 Encounter for other procedures for purposes other than remedying health state: Secondary | ICD-10-CM | POA: Diagnosis not present

## 2021-02-05 DIAGNOSIS — T8571XA Infection and inflammatory reaction due to peritoneal dialysis catheter, initial encounter: Secondary | ICD-10-CM | POA: Diagnosis not present

## 2021-02-05 DIAGNOSIS — N186 End stage renal disease: Secondary | ICD-10-CM | POA: Diagnosis not present

## 2021-02-05 DIAGNOSIS — N2581 Secondary hyperparathyroidism of renal origin: Secondary | ICD-10-CM | POA: Diagnosis not present

## 2021-02-06 DIAGNOSIS — N186 End stage renal disease: Secondary | ICD-10-CM | POA: Diagnosis not present

## 2021-02-06 DIAGNOSIS — T8571XA Infection and inflammatory reaction due to peritoneal dialysis catheter, initial encounter: Secondary | ICD-10-CM | POA: Diagnosis not present

## 2021-02-06 DIAGNOSIS — N2581 Secondary hyperparathyroidism of renal origin: Secondary | ICD-10-CM | POA: Diagnosis not present

## 2021-02-06 DIAGNOSIS — Z418 Encounter for other procedures for purposes other than remedying health state: Secondary | ICD-10-CM | POA: Diagnosis not present

## 2021-02-06 DIAGNOSIS — D631 Anemia in chronic kidney disease: Secondary | ICD-10-CM | POA: Diagnosis not present

## 2021-02-06 DIAGNOSIS — K659 Peritonitis, unspecified: Secondary | ICD-10-CM | POA: Diagnosis not present

## 2021-02-07 DIAGNOSIS — T8571XA Infection and inflammatory reaction due to peritoneal dialysis catheter, initial encounter: Secondary | ICD-10-CM | POA: Diagnosis not present

## 2021-02-07 DIAGNOSIS — N2581 Secondary hyperparathyroidism of renal origin: Secondary | ICD-10-CM | POA: Diagnosis not present

## 2021-02-07 DIAGNOSIS — D631 Anemia in chronic kidney disease: Secondary | ICD-10-CM | POA: Diagnosis not present

## 2021-02-07 DIAGNOSIS — K659 Peritonitis, unspecified: Secondary | ICD-10-CM | POA: Diagnosis not present

## 2021-02-07 DIAGNOSIS — N186 End stage renal disease: Secondary | ICD-10-CM | POA: Diagnosis not present

## 2021-02-07 DIAGNOSIS — Z418 Encounter for other procedures for purposes other than remedying health state: Secondary | ICD-10-CM | POA: Diagnosis not present

## 2021-02-08 DIAGNOSIS — K659 Peritonitis, unspecified: Secondary | ICD-10-CM | POA: Diagnosis not present

## 2021-02-08 DIAGNOSIS — N2581 Secondary hyperparathyroidism of renal origin: Secondary | ICD-10-CM | POA: Diagnosis not present

## 2021-02-08 DIAGNOSIS — N186 End stage renal disease: Secondary | ICD-10-CM | POA: Diagnosis not present

## 2021-02-08 DIAGNOSIS — D631 Anemia in chronic kidney disease: Secondary | ICD-10-CM | POA: Diagnosis not present

## 2021-02-08 DIAGNOSIS — Z418 Encounter for other procedures for purposes other than remedying health state: Secondary | ICD-10-CM | POA: Diagnosis not present

## 2021-02-08 DIAGNOSIS — T8571XA Infection and inflammatory reaction due to peritoneal dialysis catheter, initial encounter: Secondary | ICD-10-CM | POA: Diagnosis not present

## 2021-02-09 DIAGNOSIS — T8571XA Infection and inflammatory reaction due to peritoneal dialysis catheter, initial encounter: Secondary | ICD-10-CM | POA: Diagnosis not present

## 2021-02-09 DIAGNOSIS — N186 End stage renal disease: Secondary | ICD-10-CM | POA: Diagnosis not present

## 2021-02-09 DIAGNOSIS — K659 Peritonitis, unspecified: Secondary | ICD-10-CM | POA: Diagnosis not present

## 2021-02-09 DIAGNOSIS — Z418 Encounter for other procedures for purposes other than remedying health state: Secondary | ICD-10-CM | POA: Diagnosis not present

## 2021-02-09 DIAGNOSIS — D631 Anemia in chronic kidney disease: Secondary | ICD-10-CM | POA: Diagnosis not present

## 2021-02-09 DIAGNOSIS — N2581 Secondary hyperparathyroidism of renal origin: Secondary | ICD-10-CM | POA: Diagnosis not present

## 2021-02-10 DIAGNOSIS — Z418 Encounter for other procedures for purposes other than remedying health state: Secondary | ICD-10-CM | POA: Diagnosis not present

## 2021-02-10 DIAGNOSIS — N186 End stage renal disease: Secondary | ICD-10-CM | POA: Diagnosis not present

## 2021-02-10 DIAGNOSIS — D631 Anemia in chronic kidney disease: Secondary | ICD-10-CM | POA: Diagnosis not present

## 2021-02-10 DIAGNOSIS — T8571XA Infection and inflammatory reaction due to peritoneal dialysis catheter, initial encounter: Secondary | ICD-10-CM | POA: Diagnosis not present

## 2021-02-10 DIAGNOSIS — N2581 Secondary hyperparathyroidism of renal origin: Secondary | ICD-10-CM | POA: Diagnosis not present

## 2021-02-10 DIAGNOSIS — K659 Peritonitis, unspecified: Secondary | ICD-10-CM | POA: Diagnosis not present

## 2021-02-11 DIAGNOSIS — D631 Anemia in chronic kidney disease: Secondary | ICD-10-CM | POA: Diagnosis not present

## 2021-02-11 DIAGNOSIS — K659 Peritonitis, unspecified: Secondary | ICD-10-CM | POA: Diagnosis not present

## 2021-02-11 DIAGNOSIS — N186 End stage renal disease: Secondary | ICD-10-CM | POA: Diagnosis not present

## 2021-02-11 DIAGNOSIS — N2581 Secondary hyperparathyroidism of renal origin: Secondary | ICD-10-CM | POA: Diagnosis not present

## 2021-02-11 DIAGNOSIS — Z418 Encounter for other procedures for purposes other than remedying health state: Secondary | ICD-10-CM | POA: Diagnosis not present

## 2021-02-11 DIAGNOSIS — T8571XA Infection and inflammatory reaction due to peritoneal dialysis catheter, initial encounter: Secondary | ICD-10-CM | POA: Diagnosis not present

## 2021-02-12 DIAGNOSIS — K659 Peritonitis, unspecified: Secondary | ICD-10-CM | POA: Diagnosis not present

## 2021-02-12 DIAGNOSIS — D631 Anemia in chronic kidney disease: Secondary | ICD-10-CM | POA: Diagnosis not present

## 2021-02-12 DIAGNOSIS — Z418 Encounter for other procedures for purposes other than remedying health state: Secondary | ICD-10-CM | POA: Diagnosis not present

## 2021-02-12 DIAGNOSIS — T8571XA Infection and inflammatory reaction due to peritoneal dialysis catheter, initial encounter: Secondary | ICD-10-CM | POA: Diagnosis not present

## 2021-02-12 DIAGNOSIS — N2581 Secondary hyperparathyroidism of renal origin: Secondary | ICD-10-CM | POA: Diagnosis not present

## 2021-02-12 DIAGNOSIS — N186 End stage renal disease: Secondary | ICD-10-CM | POA: Diagnosis not present

## 2021-02-13 DIAGNOSIS — K659 Peritonitis, unspecified: Secondary | ICD-10-CM | POA: Diagnosis not present

## 2021-02-13 DIAGNOSIS — T8571XA Infection and inflammatory reaction due to peritoneal dialysis catheter, initial encounter: Secondary | ICD-10-CM | POA: Diagnosis not present

## 2021-02-13 DIAGNOSIS — Z418 Encounter for other procedures for purposes other than remedying health state: Secondary | ICD-10-CM | POA: Diagnosis not present

## 2021-02-13 DIAGNOSIS — D631 Anemia in chronic kidney disease: Secondary | ICD-10-CM | POA: Diagnosis not present

## 2021-02-13 DIAGNOSIS — N186 End stage renal disease: Secondary | ICD-10-CM | POA: Diagnosis not present

## 2021-02-13 DIAGNOSIS — N2581 Secondary hyperparathyroidism of renal origin: Secondary | ICD-10-CM | POA: Diagnosis not present

## 2021-02-14 DIAGNOSIS — N186 End stage renal disease: Secondary | ICD-10-CM | POA: Diagnosis not present

## 2021-02-14 DIAGNOSIS — D631 Anemia in chronic kidney disease: Secondary | ICD-10-CM | POA: Diagnosis not present

## 2021-02-14 DIAGNOSIS — Z418 Encounter for other procedures for purposes other than remedying health state: Secondary | ICD-10-CM | POA: Diagnosis not present

## 2021-02-14 DIAGNOSIS — T8571XA Infection and inflammatory reaction due to peritoneal dialysis catheter, initial encounter: Secondary | ICD-10-CM | POA: Diagnosis not present

## 2021-02-14 DIAGNOSIS — K659 Peritonitis, unspecified: Secondary | ICD-10-CM | POA: Diagnosis not present

## 2021-02-14 DIAGNOSIS — N2581 Secondary hyperparathyroidism of renal origin: Secondary | ICD-10-CM | POA: Diagnosis not present

## 2021-02-15 DIAGNOSIS — T502X5A Adverse effect of carbonic-anhydrase inhibitors, benzothiadiazides and other diuretics, initial encounter: Secondary | ICD-10-CM | POA: Diagnosis present

## 2021-02-15 DIAGNOSIS — I509 Heart failure, unspecified: Secondary | ICD-10-CM | POA: Diagnosis present

## 2021-02-15 DIAGNOSIS — I953 Hypotension of hemodialysis: Secondary | ICD-10-CM | POA: Diagnosis present

## 2021-02-15 DIAGNOSIS — N9489 Other specified conditions associated with female genital organs and menstrual cycle: Secondary | ICD-10-CM | POA: Diagnosis not present

## 2021-02-15 DIAGNOSIS — N858 Other specified noninflammatory disorders of uterus: Secondary | ICD-10-CM | POA: Diagnosis not present

## 2021-02-15 DIAGNOSIS — N2581 Secondary hyperparathyroidism of renal origin: Secondary | ICD-10-CM | POA: Diagnosis not present

## 2021-02-15 DIAGNOSIS — Z79899 Other long term (current) drug therapy: Secondary | ICD-10-CM | POA: Diagnosis not present

## 2021-02-15 DIAGNOSIS — M810 Age-related osteoporosis without current pathological fracture: Secondary | ICD-10-CM | POA: Diagnosis present

## 2021-02-15 DIAGNOSIS — K5730 Diverticulosis of large intestine without perforation or abscess without bleeding: Secondary | ICD-10-CM | POA: Diagnosis not present

## 2021-02-15 DIAGNOSIS — R1909 Other intra-abdominal and pelvic swelling, mass and lump: Secondary | ICD-10-CM | POA: Diagnosis not present

## 2021-02-15 DIAGNOSIS — I252 Old myocardial infarction: Secondary | ICD-10-CM | POA: Diagnosis not present

## 2021-02-15 DIAGNOSIS — Z66 Do not resuscitate: Secondary | ICD-10-CM | POA: Diagnosis not present

## 2021-02-15 DIAGNOSIS — Z79890 Hormone replacement therapy (postmenopausal): Secondary | ICD-10-CM | POA: Diagnosis not present

## 2021-02-15 DIAGNOSIS — I9589 Other hypotension: Secondary | ICD-10-CM | POA: Diagnosis not present

## 2021-02-15 DIAGNOSIS — N185 Chronic kidney disease, stage 5: Secondary | ICD-10-CM | POA: Diagnosis not present

## 2021-02-15 DIAGNOSIS — I120 Hypertensive chronic kidney disease with stage 5 chronic kidney disease or end stage renal disease: Secondary | ICD-10-CM | POA: Diagnosis not present

## 2021-02-15 DIAGNOSIS — I4891 Unspecified atrial fibrillation: Secondary | ICD-10-CM | POA: Diagnosis present

## 2021-02-15 DIAGNOSIS — E871 Hypo-osmolality and hyponatremia: Secondary | ICD-10-CM | POA: Diagnosis not present

## 2021-02-15 DIAGNOSIS — R1900 Intra-abdominal and pelvic swelling, mass and lump, unspecified site: Secondary | ICD-10-CM | POA: Diagnosis not present

## 2021-02-15 DIAGNOSIS — T8571XA Infection and inflammatory reaction due to peritoneal dialysis catheter, initial encounter: Secondary | ICD-10-CM | POA: Diagnosis not present

## 2021-02-15 DIAGNOSIS — R1310 Dysphagia, unspecified: Secondary | ICD-10-CM | POA: Diagnosis not present

## 2021-02-15 DIAGNOSIS — Z515 Encounter for palliative care: Secondary | ICD-10-CM | POA: Diagnosis not present

## 2021-02-15 DIAGNOSIS — M1990 Unspecified osteoarthritis, unspecified site: Secondary | ICD-10-CM | POA: Diagnosis present

## 2021-02-15 DIAGNOSIS — E7800 Pure hypercholesterolemia, unspecified: Secondary | ICD-10-CM | POA: Diagnosis present

## 2021-02-15 DIAGNOSIS — Z7982 Long term (current) use of aspirin: Secondary | ICD-10-CM | POA: Diagnosis not present

## 2021-02-15 DIAGNOSIS — E876 Hypokalemia: Secondary | ICD-10-CM | POA: Diagnosis present

## 2021-02-15 DIAGNOSIS — I2510 Atherosclerotic heart disease of native coronary artery without angina pectoris: Secondary | ICD-10-CM | POA: Diagnosis present

## 2021-02-15 DIAGNOSIS — K5790 Diverticulosis of intestine, part unspecified, without perforation or abscess without bleeding: Secondary | ICD-10-CM | POA: Diagnosis not present

## 2021-02-15 DIAGNOSIS — K659 Peritonitis, unspecified: Secondary | ICD-10-CM | POA: Diagnosis not present

## 2021-02-15 DIAGNOSIS — Z992 Dependence on renal dialysis: Secondary | ICD-10-CM | POA: Diagnosis not present

## 2021-02-15 DIAGNOSIS — I959 Hypotension, unspecified: Secondary | ICD-10-CM | POA: Diagnosis not present

## 2021-02-15 DIAGNOSIS — E878 Other disorders of electrolyte and fluid balance, not elsewhere classified: Secondary | ICD-10-CM | POA: Diagnosis not present

## 2021-02-15 DIAGNOSIS — E440 Moderate protein-calorie malnutrition: Secondary | ICD-10-CM | POA: Diagnosis not present

## 2021-02-15 DIAGNOSIS — E039 Hypothyroidism, unspecified: Secondary | ICD-10-CM | POA: Diagnosis present

## 2021-02-15 DIAGNOSIS — D631 Anemia in chronic kidney disease: Secondary | ICD-10-CM | POA: Diagnosis present

## 2021-02-15 DIAGNOSIS — J90 Pleural effusion, not elsewhere classified: Secondary | ICD-10-CM | POA: Diagnosis not present

## 2021-02-15 DIAGNOSIS — Z418 Encounter for other procedures for purposes other than remedying health state: Secondary | ICD-10-CM | POA: Diagnosis not present

## 2021-02-15 DIAGNOSIS — R630 Anorexia: Secondary | ICD-10-CM | POA: Diagnosis not present

## 2021-02-15 DIAGNOSIS — D649 Anemia, unspecified: Secondary | ICD-10-CM | POA: Diagnosis not present

## 2021-02-15 DIAGNOSIS — I132 Hypertensive heart and chronic kidney disease with heart failure and with stage 5 chronic kidney disease, or end stage renal disease: Secondary | ICD-10-CM | POA: Diagnosis present

## 2021-02-15 DIAGNOSIS — B965 Pseudomonas (aeruginosa) (mallei) (pseudomallei) as the cause of diseases classified elsewhere: Secondary | ICD-10-CM | POA: Diagnosis present

## 2021-02-15 DIAGNOSIS — G8929 Other chronic pain: Secondary | ICD-10-CM | POA: Diagnosis present

## 2021-02-15 DIAGNOSIS — I700 Atherosclerosis of aorta: Secondary | ICD-10-CM | POA: Diagnosis present

## 2021-02-15 DIAGNOSIS — N186 End stage renal disease: Secondary | ICD-10-CM | POA: Diagnosis not present

## 2021-02-15 DIAGNOSIS — I358 Other nonrheumatic aortic valve disorders: Secondary | ICD-10-CM | POA: Diagnosis not present

## 2021-02-15 DIAGNOSIS — E1122 Type 2 diabetes mellitus with diabetic chronic kidney disease: Secondary | ICD-10-CM | POA: Diagnosis not present

## 2021-02-15 DIAGNOSIS — K449 Diaphragmatic hernia without obstruction or gangrene: Secondary | ICD-10-CM | POA: Diagnosis not present

## 2021-02-15 DIAGNOSIS — K658 Other peritonitis: Secondary | ICD-10-CM | POA: Diagnosis present

## 2021-02-16 DIAGNOSIS — Z418 Encounter for other procedures for purposes other than remedying health state: Secondary | ICD-10-CM | POA: Diagnosis not present

## 2021-02-16 DIAGNOSIS — N186 End stage renal disease: Secondary | ICD-10-CM | POA: Diagnosis not present

## 2021-02-16 DIAGNOSIS — T8571XA Infection and inflammatory reaction due to peritoneal dialysis catheter, initial encounter: Secondary | ICD-10-CM | POA: Diagnosis not present

## 2021-02-16 DIAGNOSIS — N2581 Secondary hyperparathyroidism of renal origin: Secondary | ICD-10-CM | POA: Diagnosis not present

## 2021-02-16 DIAGNOSIS — K659 Peritonitis, unspecified: Secondary | ICD-10-CM | POA: Diagnosis not present

## 2021-02-16 DIAGNOSIS — D631 Anemia in chronic kidney disease: Secondary | ICD-10-CM | POA: Diagnosis not present

## 2021-03-07 DEATH — deceased
